# Patient Record
Sex: Female | Born: 1990 | Race: Black or African American | Hispanic: No | Marital: Single | Smoking: Former smoker
Health system: Southern US, Community
[De-identification: ages and names within clinical notes are randomized; demographics above are authoritative.]

## PROBLEM LIST (undated history)

## (undated) ENCOUNTER — Inpatient Hospital Stay (HOSPITAL_COMMUNITY): Payer: Self-pay

## (undated) DIAGNOSIS — F329 Major depressive disorder, single episode, unspecified: Secondary | ICD-10-CM

## (undated) DIAGNOSIS — F32A Depression, unspecified: Secondary | ICD-10-CM

## (undated) DIAGNOSIS — O4590 Premature separation of placenta, unspecified, unspecified trimester: Secondary | ICD-10-CM

## (undated) DIAGNOSIS — D573 Sickle-cell trait: Secondary | ICD-10-CM

---
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
## 2012-01-19 ENCOUNTER — Encounter (HOSPITAL_COMMUNITY): Payer: Self-pay | Admitting: *Deleted

## 2012-01-19 ENCOUNTER — Inpatient Hospital Stay (HOSPITAL_COMMUNITY)
Admission: AD | Admit: 2012-01-19 | Discharge: 2012-01-19 | Disposition: A | Discharge status: Home / Self Care | Payer: Medicaid Other | Source: Ambulatory Visit | Attending: Obstetrics and Gynecology | Admitting: Obstetrics and Gynecology

## 2012-01-19 DIAGNOSIS — R109 Unspecified abdominal pain: Secondary | ICD-10-CM | POA: Insufficient documentation

## 2012-01-19 DIAGNOSIS — O99891 Other specified diseases and conditions complicating pregnancy: Secondary | ICD-10-CM | POA: Insufficient documentation

## 2012-01-19 DIAGNOSIS — O26899 Other specified pregnancy related conditions, unspecified trimester: Secondary | ICD-10-CM

## 2012-01-19 HISTORY — DX: Sickle-cell trait: D57.3

## 2012-01-19 LAB — CBC
HCT: 30.5 % — ABNORMAL LOW (ref 36.0–46.0)
Hemoglobin: 10.4 g/dL — ABNORMAL LOW (ref 12.0–15.0)
MCH: 26.1 pg (ref 26.0–34.0)
MCV: 76.4 fL — ABNORMAL LOW (ref 78.0–100.0)
Platelets: 271 10*3/uL (ref 150–400)
RBC: 3.99 MIL/uL (ref 3.87–5.11)

## 2012-01-19 LAB — URINALYSIS, ROUTINE W REFLEX MICROSCOPIC
Bilirubin Urine: NEGATIVE
Glucose, UA: NEGATIVE mg/dL
Hgb urine dipstick: NEGATIVE
Specific Gravity, Urine: 1.02 (ref 1.005–1.030)
Urobilinogen, UA: 0.2 mg/dL (ref 0.0–1.0)

## 2012-01-19 LAB — COMPREHENSIVE METABOLIC PANEL
ALT: 5 U/L (ref 0–35)
AST: 11 U/L (ref 0–37)
Albumin: 2.8 g/dL — ABNORMAL LOW (ref 3.5–5.2)
Alkaline Phosphatase: 71 U/L (ref 39–117)
Potassium: 3.8 mEq/L (ref 3.5–5.1)
Sodium: 134 mEq/L — ABNORMAL LOW (ref 135–145)
Total Protein: 6.5 g/dL (ref 6.0–8.3)

## 2012-01-19 LAB — URINE MICROSCOPIC-ADD ON

## 2012-01-19 MED ORDER — ACETAMINOPHEN 325 MG PO TABS
650.0000 mg | ORAL_TABLET | Freq: Once | ORAL | Status: AC
  Administered 2012-01-19: 650 mg via ORAL
  Filled 2012-01-19: qty 2

## 2012-01-19 MED ORDER — GI COCKTAIL ~~LOC~~
30.0000 mL | Freq: Once | ORAL | Status: AC
  Administered 2012-01-19: 30 mL via ORAL
  Filled 2012-01-19: qty 30

## 2012-01-19 NOTE — MAU Note (Signed)
Dizzy and having sharp pains (Rt upper quad), this started last night.  Hurts when she breaths. Denies urinary symptoms.  nausea

## 2012-01-19 NOTE — MAU Provider Note (Signed)
History     CSN: [id number]  Arrival date & time 01/19/12  1234   None     Chief Complaint  Patient presents with  . Abdominal Pain  . Dizziness    (Consider location/radiation/quality/duration/timing/severity/associated sxs/prior treatment) HPI Pt is [redacted] weeks pregnant and presents with sharp shooting pain constant.  She is more comfortable on her side.  The pain is worse when she stands up and walk.  Earlier it hurt when she would take a deep breat- but not now.  She has a cough and cold for about one week.- non productive. She has been nauseated, but not vomiting. She did not take any medication for the pain because she says she did not know what to take.  The pain is better now that she has been laying on her side. Past Medical History  Diagnosis Date  . Depression   . Sickle cell trait     Past Surgical History  Procedure Date  . No past surgeries     History reviewed. No pertinent family history.  History  Substance Use Topics  . Smoking status: Never Smoker   . Smokeless tobacco: Never Used  . Alcohol Use: No    OB History    Grav Para Term Preterm Abortions TAB SAB Ect Mult Living   2 1 1       1       Review of Systems  HENT: Positive for congestion.   Respiratory: Positive for cough.   Gastrointestinal: Negative for abdominal distention.  Neurological: Positive for dizziness, light-headedness and headaches.    Allergies  Review of patient's allergies indicates no known allergies.  Home Medications  No current outpatient prescriptions on file.  BP 105/55  Pulse 95  Temp(Src) 97 F (36.1 C) (Oral)  Resp 20  Ht 5\' 5"  (1.651 m)  Wt 159 lb (72.122 kg)  BMI 26.46 kg/m2  SpO2 100%  LMP 07/14/2011  Physical Exam  Nursing note and vitals reviewed. Constitutional: She is oriented to person, place, and time. She appears well-developed and well-nourished.  Eyes: Pupils are equal, round, and reactive to light.  Neck: Normal range of motion. Neck  supple.  Pulmonary/Chest: Effort normal.  Abdominal: Soft. She exhibits no distension. There is no tenderness. There is no rebound and no guarding.       Minimal RUQ pain with palpation- no rebound.  Fetal heart rate documented on monitor - no contractions palpated or monitored  Musculoskeletal: Normal range of motion.  Neurological: She is alert and oriented to person, place, and time.  Skin: Skin is warm and dry.  Psychiatric: She has a normal mood and affect.    ED Course  Procedures (including critical care time) Results for orders placed during the hospital encounter of 01/19/12 (from the past 24 hour(s))  CBC     Status: Abnormal   Collection Time   01/19/12  1:17 PM      Component Value Range   WBC 10.2  4.0 - 10.5 (K/uL)   RBC 3.99  3.87 - 5.11 (MIL/uL)   Hemoglobin 10.4 (*) 12.0 - 15.0 (g/dL)   HCT 91.4 (*) 78.2 - 46.0 (%)   MCV 76.4 (*) 78.0 - 100.0 (fL)   MCH 26.1  26.0 - 34.0 (pg)   MCHC 34.1  30.0 - 36.0 (g/dL)   RDW 95.6  21.3 - 08.6 (%)   Platelets 271  150 - 400 (K/uL)    Labs Reviewed  URINALYSIS, ROUTINE W REFLEX MICROSCOPIC  CBC  COMPREHENSIVE METABOLIC PANEL   Results for orders placed during the hospital encounter of 01/19/12 (from the past 24 hour(s))  URINALYSIS, ROUTINE W REFLEX MICROSCOPIC     Status: Abnormal   Collection Time   01/19/12 12:55 PM      Component Value Range   Color, Urine YELLOW  YELLOW    APPearance CLEAR  CLEAR    Specific Gravity, Urine 1.020  1.005 - 1.030    pH 6.0  5.0 - 8.0    Glucose, UA NEGATIVE  NEGATIVE (mg/dL)   Hgb urine dipstick NEGATIVE  NEGATIVE    Bilirubin Urine NEGATIVE  NEGATIVE    Ketones, ur NEGATIVE  NEGATIVE (mg/dL)   Protein, ur NEGATIVE  NEGATIVE (mg/dL)   Urobilinogen, UA 0.2  0.0 - 1.0 (mg/dL)   Nitrite NEGATIVE  NEGATIVE    Leukocytes, UA SMALL (*) NEGATIVE   URINE MICROSCOPIC-ADD ON     Status: Abnormal   Collection Time   01/19/12 12:55 PM      Component Value Range   Squamous Epithelial / LPF  MANY (*) RARE    WBC, UA 3-6  <3 (WBC/hpf)   Bacteria, UA FEW (*) RARE    Urine-Other TRICHOMONAS PRESENT    CBC     Status: Abnormal   Collection Time   01/19/12  1:17 PM      Component Value Range   WBC 10.2  4.0 - 10.5 (K/uL)   RBC 3.99  3.87 - 5.11 (MIL/uL)   Hemoglobin 10.4 (*) 12.0 - 15.0 (g/dL)   HCT 27.2 (*) 53.6 - 46.0 (%)   MCV 76.4 (*) 78.0 - 100.0 (fL)   MCH 26.1  26.0 - 34.0 (pg)   MCHC 34.1  30.0 - 36.0 (g/dL)   RDW 64.4  03.4 - 74.2 (%)   Platelets 271  150 - 400 (K/uL)  COMPREHENSIVE METABOLIC PANEL     Status: Abnormal (Preliminary result)   Collection Time   01/19/12  1:17 PM      Component Value Range   Sodium 134 (*) 135 - 145 (mEq/L)   Potassium 3.8  3.5 - 5.1 (mEq/L)   Chloride 102  96 - 112 (mEq/L)   CO2 22  19 - 32 (mEq/L)   Glucose, Bld 91  70 - 99 (mg/dL)   BUN 11  6 - 23 (mg/dL)   Creatinine, Ser 5.95  0.50 - 1.10 (mg/dL)   Calcium 8.8  8.4 - 63.8 (mg/dL)   Total Protein 6.5  6.0 - 8.3 (g/dL)   Albumin 2.8 (*) 3.5 - 5.2 (g/dL)   AST 11  0 - 37 (U/L)   ALT 5  0 - 35 (U/L)   Alkaline Phosphatase PENDING  39 - 117 (U/L)   Total Bilirubin PENDING  0.3 - 1.2 (mg/dL)   GFR calc non Af Amer >90  >90 (mL/min)   GFR calc Af Amer >90  >90 (mL/min)   Results for orders placed during the hospital encounter of 01/19/12 (from the past 24 hour(s))  URINALYSIS, ROUTINE W REFLEX MICROSCOPIC     Status: Abnormal   Collection Time   01/19/12 12:55 PM      Component Value Range   Color, Urine YELLOW  YELLOW    APPearance CLEAR  CLEAR    Specific Gravity, Urine 1.020  1.005 - 1.030    pH 6.0  5.0 - 8.0    Glucose, UA NEGATIVE  NEGATIVE (mg/dL)   Hgb urine dipstick NEGATIVE  NEGATIVE  Bilirubin Urine NEGATIVE  NEGATIVE    Ketones, ur NEGATIVE  NEGATIVE (mg/dL)   Protein, ur NEGATIVE  NEGATIVE (mg/dL)   Urobilinogen, UA 0.2  0.0 - 1.0 (mg/dL)   Nitrite NEGATIVE  NEGATIVE    Leukocytes, UA SMALL (*) NEGATIVE   URINE MICROSCOPIC-ADD ON     Status: Abnormal    Collection Time   01/19/12 12:55 PM      Component Value Range   Squamous Epithelial / LPF MANY (*) RARE    WBC, UA 3-6  <3 (WBC/hpf)   Bacteria, UA FEW (*) RARE    Urine-Other TRICHOMONAS PRESENT    CBC     Status: Abnormal   Collection Time   01/19/12  1:17 PM      Component Value Range   WBC 10.2  4.0 - 10.5 (K/uL)   RBC 3.99  3.87 - 5.11 (MIL/uL)   Hemoglobin 10.4 (*) 12.0 - 15.0 (g/dL)   HCT 40.9 (*) 81.1 - 46.0 (%)   MCV 76.4 (*) 78.0 - 100.0 (fL)   MCH 26.1  26.0 - 34.0 (pg)   MCHC 34.1  30.0 - 36.0 (g/dL)   RDW 91.4  78.2 - 95.6 (%)   Platelets 271  150 - 400 (K/uL)  COMPREHENSIVE METABOLIC PANEL     Status: Abnormal   Collection Time   01/19/12  1:17 PM      Component Value Range   Sodium 134 (*) 135 - 145 (mEq/L)   Potassium 3.8  3.5 - 5.1 (mEq/L)   Chloride 102  96 - 112 (mEq/L)   CO2 22  19 - 32 (mEq/L)   Glucose, Bld 91  70 - 99 (mg/dL)   BUN 11  6 - 23 (mg/dL)   Creatinine, Ser 2.13  0.50 - 1.10 (mg/dL)   Calcium 8.8  8.4 - 08.6 (mg/dL)   Total Protein 6.5  6.0 - 8.3 (g/dL)   Albumin 2.8 (*) 3.5 - 5.2 (g/dL)   AST 11  0 - 37 (U/L)   ALT 5  0 - 35 (U/L)   Alkaline Phosphatase 71  39 - 117 (U/L)   Total Bilirubin 0.2 (*) 0.3 - 1.2 (mg/dL)   GFR calc non Af Amer >90  >90 (mL/min)   GFR calc Af Amer >90  >90 (mL/min)  LIPASE, BLOOD     Status: Normal   Collection Time   01/19/12  1:17 PM      Component Value Range   Lipase 26  11 - 59 (U/L)  AMYLASE     Status: Normal   Collection Time   01/19/12  1:17 PM      Component Value Range   Amylase 73  0 - 105 (U/L)   No results found. Lipase and Amylase pending Discussed with Dr. Senaida Ores-( pt has not had an OB visit in 2 months)  Abdominal pain in pregnancy GI cocktail and Tylenol given in MAU Pt to f/u for continued OB care with Dr. Senaida Ores   MDM

## 2012-02-02 ENCOUNTER — Encounter (HOSPITAL_COMMUNITY): Payer: Self-pay | Admitting: Emergency Medicine

## 2012-02-02 ENCOUNTER — Emergency Department (INDEPENDENT_AMBULATORY_CARE_PROVIDER_SITE_OTHER)
Admission: EM | Admit: 2012-02-02 | Discharge: 2012-02-02 | Disposition: A | Discharge status: Nursing Home (Includes ICF) | Payer: Medicaid Other | Source: Home / Self Care | Attending: Emergency Medicine | Admitting: Emergency Medicine

## 2012-02-02 ENCOUNTER — Encounter (HOSPITAL_COMMUNITY): Payer: Self-pay | Admitting: *Deleted

## 2012-02-02 ENCOUNTER — Inpatient Hospital Stay (HOSPITAL_COMMUNITY)
Admission: AD | Admit: 2012-02-02 | Discharge: 2012-02-02 | Disposition: A | Discharge status: Home / Self Care | Payer: Medicaid Other | Source: Ambulatory Visit | Attending: Obstetrics and Gynecology | Admitting: Obstetrics and Gynecology

## 2012-02-02 DIAGNOSIS — A599 Trichomoniasis, unspecified: Secondary | ICD-10-CM

## 2012-02-02 DIAGNOSIS — A5901 Trichomonal vulvovaginitis: Secondary | ICD-10-CM

## 2012-02-02 DIAGNOSIS — O4700 False labor before 37 completed weeks of gestation, unspecified trimester: Secondary | ICD-10-CM

## 2012-02-02 DIAGNOSIS — D649 Anemia, unspecified: Secondary | ICD-10-CM

## 2012-02-02 DIAGNOSIS — O98819 Other maternal infectious and parasitic diseases complicating pregnancy, unspecified trimester: Secondary | ICD-10-CM | POA: Insufficient documentation

## 2012-02-02 DIAGNOSIS — R109 Unspecified abdominal pain: Secondary | ICD-10-CM

## 2012-02-02 DIAGNOSIS — O26899 Other specified pregnancy related conditions, unspecified trimester: Secondary | ICD-10-CM

## 2012-02-02 DIAGNOSIS — E860 Dehydration: Secondary | ICD-10-CM

## 2012-02-02 LAB — CBC
MCV: 75.1 fL — ABNORMAL LOW (ref 78.0–100.0)
Platelets: 223 10*3/uL (ref 150–400)
RBC: 3.7 MIL/uL — ABNORMAL LOW (ref 3.87–5.11)
RDW: 13.9 % (ref 11.5–15.5)
WBC: 10.8 10*3/uL — ABNORMAL HIGH (ref 4.0–10.5)

## 2012-02-02 LAB — DIFFERENTIAL
Basophils Absolute: 0 10*3/uL (ref 0.0–0.1)
Eosinophils Relative: 1 % (ref 0–5)
Lymphocytes Relative: 20 % (ref 12–46)
Lymphs Abs: 2.2 10*3/uL (ref 0.7–4.0)
Neutro Abs: 7.6 10*3/uL (ref 1.7–7.7)
Neutrophils Relative %: 70 % (ref 43–77)

## 2012-02-02 LAB — POCT URINALYSIS DIP (DEVICE)
Glucose, UA: NEGATIVE mg/dL
Ketones, ur: NEGATIVE mg/dL
Nitrite: NEGATIVE
Specific Gravity, Urine: >=1.03 (ref 1.005–1.030)
pH: 6.5 (ref 5.0–8.0)

## 2012-02-02 LAB — WET PREP, GENITAL
Clue Cells Wet Prep HPF POC: NONE SEEN
Yeast Wet Prep HPF POC: NONE SEEN

## 2012-02-02 LAB — POCT I-STAT, CHEM 8
BUN: 8 mg/dL (ref 6–23)
Creatinine, Ser: 0.7 mg/dL (ref 0.50–1.10)
Potassium: 3.6 mEq/L (ref 3.5–5.1)
Sodium: 137 mEq/L (ref 135–145)
TCO2: 22 mmol/L (ref 0–100)

## 2012-02-02 MED ORDER — SODIUM CHLORIDE 0.9 % IV SOLN
INTRAVENOUS | Status: DC
  Administered 2012-02-02: 19:00:00 via INTRAVENOUS

## 2012-02-02 MED ORDER — METRONIDAZOLE 500 MG PO TABS: 500.0000 mg | ORAL_TABLET | Freq: Two times a day (BID) | ORAL | Status: AC

## 2012-02-02 MED ORDER — TERBUTALINE SULFATE 1 MG/ML IJ SOLN
0.2500 mg | Freq: Once | INTRAMUSCULAR | Status: AC
  Administered 2012-02-02: 0.25 mg via SUBCUTANEOUS

## 2012-02-02 MED ORDER — LACTATED RINGERS IV SOLN
INTRAVENOUS | Status: DC
  Administered 2012-02-02: 22:00:00 via INTRAVENOUS

## 2012-02-02 NOTE — Discharge Instructions (Signed)
We have determined that your problem requires further evaluation in the emergency department.  We will take care of your transport there.  Once at the emergency department, you will be evaluated by a provider and they will order whatever treatment or tests they deem necessary.  We cannot guarantee that they will do any specific test or do any specific treatment.    Dehydration, Adult Dehydration means your body does not have as much fluid as it needs. Your kidneys, brain, and heart will not work properly without the right amount of fluids and salt.  HOME CARE  Ask your doctor how to replace body fluid losses (rehydrate).   Drink enough fluids to keep your pee (urine) clear or pale yellow.   Drink small amounts of fluids often if you feel sick to your stomach (nauseous) or throw up (vomit).   Eat like you normally do.   Avoid:   Foods or drinks high in sugar.   Bubbly (carbonated) drinks.   Juice.   Very hot or cold fluids.   Drinks with caffeine.   Fatty, greasy foods.   Alcohol.   Tobacco.   Eating too much.   Gelatin desserts.   Wash your hands to avoid spreading germs (bacteria, viruses).   Only take medicine as told by your doctor.   Keep all doctor visits as told.  GET HELP RIGHT AWAY IF:   You cannot drink something without throwing up.   You get worse even with treatment.   Your vomit has blood in it or looks greenish.   Your poop (stool) has blood in it or looks black and tarry.   You have not peed in 6 to 8 hours.   You pee a small amount of very dark pee.   You have a fever.   You pass out (faint).   You have belly (abdominal) pain that gets worse or stays in one spot (localizes).   You have a rash, stiff neck, or bad headache.   You get easily annoyed, sleepy, or are hard to wake up.   You feel weak, dizzy, or very thirsty.  MAKE SURE YOU:   Understand these instructions.   Will watch your condition.   Will get help right away if you  are not doing well or get worse.  Document Released: 06/27/2009 Document Revised: 08/20/2011 Document Reviewed: 04/20/2011 [facility] Patient Information 2012 [place], [place].Iron Deficiency Anemia There are many types of anemia. Iron deficiency anemia is the most common. Iron deficiency anemia is a decrease in the number of red blood cells caused by too little iron. Without enough iron, your body does not produce enough hemoglobin. Hemoglobin is a substance in red blood cells that carries oxygen to the body's tissues. Iron deficiency anemia may leave you tired and short of breath. CAUSES   Lack of iron in the diet.   This may be seen in infants and children, because there is little iron in milk.   This may be seen in adults who do not eat enough iron-rich foods.   This may be seen in pregnant or breastfeeding women who do not take iron supplements. There is a much higher need for iron intake at these times.   Poor absorption of iron, as seen with intestinal disorders.   Intestinal bleeding.   Heavy periods.  SYMPTOMS  Mild anemia may not be noticeable. Symptoms may include:  Fatigue.   Headache.   Pale skin.   Weakness.   Shortness of breath.  Dizziness.   Cold hands and feet.   Fast or irregular heartbeat.  DIAGNOSIS  Diagnosis requires a thorough evaluation and physical exam by your caregiver.  Blood tests are generally used to confirm iron deficiency anemia.   Additional tests may be done to find the underlying cause of your anemia. These may include:   Testing for blood in the stool (fecal occult blood test).   A procedure to see inside the colon and rectum (colonoscopy).   A procedure to see inside the esophagus and stomach (endoscopy).  TREATMENT   Correcting the cause of the iron deficiency is the first step.   Medicines, such as oral contraceptives, can make heavy menstrual flows lighter.   Antibiotics and other medicines can be used to treat peptic  ulcers.   Surgery may be needed to remove a bleeding polyp, tumor, or fibroid.   Often, iron supplements (ferrous sulfate) are taken.   For the best iron absorption, take these supplements with an empty stomach.   You may need to take the supplements with food if you cannot tolerate them on an empty stomach. Vitamin C improves the absorption of iron. Your caregiver may recommend taking your iron tablets with a glass of orange juice or vitamin C supplement.   Milk and antacids should not be taken at the same time as iron supplements. They may interfere with the absorption of iron.   Iron supplements can cause constipation. A stool softener is often recommended.   Pregnant and breastfeeding women will need to take extra iron, because their normal diet usually will not provide the required amount.   Patients who cannot tolerate iron by mouth can take it through a vein (intravenously) or by an injection into the muscle.  HOME CARE INSTRUCTIONS   Ask your dietitian for help with diet questions.   Take iron and vitamins as directed by your caregiver.   Eat a diet rich in iron. Eat liver, lean beef, whole-grain bread, eggs, dried fruit, and dark green leafy vegetables.  SEEK IMMEDIATE MEDICAL CARE IF:   You have a fainting episode. Do not drive yourself. Call your local emergency services (911 in [place] if no other help is available.   You have chest pain, nausea, or vomiting.   You develop severe or increased shortness of breath with activities.   You develop weakness or increased thirst.   You have a rapid heartbeat.   You develop unexplained sweating or become lightheaded when getting up from a chair or bed.  MAKE SURE YOU:   Understand these instructions.   Will watch your condition.   Will get help right away if you are not doing well or get worse.  Document Released: 08/28/2000 Document Revised: 08/20/2011 Document Reviewed: 01/07/2010 [facility] Patient Information 2012  [place], [place].Trichomoniasis Trichomoniasis is an infection, caused by the Trichomonas organism, that affects both women and men. In women, the outer female genitalia and the vagina are affected. In men, the penis is mainly affected, but the prostate and other reproductive organs can also be involved. Trichomoniasis is a sexually transmitted disease (STD) and is most often passed to another person through sexual contact. The majority of people who get trichomoniasis, do so from a sexual encounter and are also at risk for other STDs, including gonorrhea and chlamydia, hepatitis B, and HIV. Trichomoniasis can increase the risk for HIV and pregnancy problems. SYMPTOMS   Abnormal gray-green, frothy vaginal discharge in women.   Itching and irritation of the area outside the vagina  in women.   Penile discharge with or without pain in males.   Inflammation of the urethra (urethritis), causing painful urination.   Bleeding after sexual intercourse.  HOME CARE INSTRUCTIONS   Take all medication prescribed by your caregiver.   Take over-the-counter medication for itching or irritation as directed by your caregiver.   Do not have sexual intercourse while you have the infection.   Do not douche or wear tampons while you have the infection.   Discuss the infection with your partner, as your partner may have acquired the infection from you. Or, your partner may have been the person who transmitted the infection to you.   Have your sex partner examined and treated if necessary.   Practice safe, informed, and protected sex.   See your caregiver for other STD testing.  SEEK MEDICAL CARE IF:  You still have symptoms after you finish the medication.   You have an oral temperature above 102 F (38.9 C).   You develop belly (abdominal) pain.   You have pain when you urinate.   You have bleeding after sexual intercourse.   You develop a rash.   The medication makes you sick or throw up  (vomit).  Document Released: 10/08/2004 Document Revised: 08/20/2011 Document Reviewed: 03/22/2009 [facility] Patient Information 2012 [place], [place].

## 2012-02-02 NOTE — ED Notes (Addendum)
Vaginal pain and discharge, onset Wednesday.  This is patient's second pregnancy.  C/o dizziness, feeling as if she might pass out.

## 2012-02-02 NOTE — MAU Note (Signed)
Pt states she went to Urgent Care because she was feeling dizzy and was having pain in her abdomen. Pt states she no longer has any pain

## 2012-02-02 NOTE — MAU Provider Note (Signed)
History     CSN: [id number]  Arrival date and time: 02/02/12 1927   None     Chief Complaint  Patient presents with  . Abdominal Pain   HPI Linda Barry is 21 y.o. G2P1001 [redacted]w[redacted]d weeks presenting with abdominal pain.   She was transferred from [facility] by [facility].   She has Trichomonas by urinalysis on last visit 2 weeks ago when I saw her in MAU.  At that time she was complaining of upper abdominal pain.  She left before urinalysis results were back. On review- she had Trichomonas.   She was seen here two weeks ago for dizziness.  This abdominal pain last week but has become worse.  She has had vaginal pain worse today.  She was last seen in the office last Thursday but was not having bad pain at that time.  Her labs today at Urgent Care included wet prep which showed many trich .  GC and Chlamydia are pending.  Urine culture pending. Past Medical History  Diagnosis Date  . Depression   . Sickle cell trait     Past Surgical History  Procedure Date  . No past surgeries     No family history on file.  History  Substance Use Topics  . Smoking status: Never Smoker   . Smokeless tobacco: Never Used  . Alcohol Use: No    Allergies: No Known Allergies  Prescriptions prior to admission  Medication Sig Dispense Refill  . Prenatal Vit-Fe Fumarate-FA (PRENATAL MULTIVITAMIN) TABS Take 1 tablet by mouth daily.        ROS Physical Exam   Blood pressure 102/60, pulse 77, temperature 98.1 F (36.7 C), temperature source Oral, resp. rate 18, last menstrual period 07/14/2011.  Physical Exam  Vitals reviewed. Constitutional: She is oriented to person, place, and time. She appears well-developed and well-nourished.  HENT:  Head: Normocephalic.  Eyes: Pupils are equal, round, and reactive to light.  Neck: Normal range of motion. Neck supple.  Cardiovascular: Normal rate.   Respiratory: Effort normal.  GI: Soft.  Genitourinary:       Mod amount of frothy yellow  discharge in vault; vaginal mucosa reddened; cervix NT , FT; uterus gravid nontender;  ctx noted on monitor  Musculoskeletal: Normal range of motion.  Neurological: She is alert and oriented to person, place, and time.  Skin: Skin is warm and dry.    MAU Course  Procedures  Results for orders placed during the hospital encounter of 02/02/12 (from the past 24 hour(s))  POCT URINALYSIS DIP (DEVICE)     Status: Abnormal   Collection Time   02/02/12  5:11 PM      Component Value Range   Glucose, UA NEGATIVE  NEGATIVE (mg/dL)   Bilirubin Urine SMALL (*) NEGATIVE    Ketones, ur NEGATIVE  NEGATIVE (mg/dL)   Specific Gravity, Urine >=1.030  1.005 - 1.030    Hgb urine dipstick TRACE (*) NEGATIVE    pH 6.5  5.0 - 8.0    Protein, ur 100 (*) NEGATIVE (mg/dL)   Urobilinogen, UA 1.0  0.0 - 1.0 (mg/dL)   Nitrite NEGATIVE  NEGATIVE    Leukocytes, UA SMALL (*) NEGATIVE   POCT PREGNANCY, URINE     Status: Abnormal   Collection Time   02/02/12  5:16 PM      Component Value Range   Preg Test, Ur POSITIVE (*) NEGATIVE   WET PREP, GENITAL     Status: Abnormal  Collection Time   02/02/12  5:42 PM      Component Value Range   Yeast Wet Prep HPF POC NONE SEEN  NONE SEEN    Trich, Wet Prep MANY (*) NONE SEEN    Clue Cells Wet Prep HPF POC NONE SEEN  NONE SEEN    WBC, Wet Prep HPF POC MANY (*) NONE SEEN   CBC     Status: Abnormal   Collection Time   02/02/12  5:48 PM      Component Value Range   WBC 10.8 (*) 4.0 - 10.5 (K/uL)   RBC 3.70 (*) 3.87 - 5.11 (MIL/uL)   Hemoglobin 9.8 (*) 12.0 - 15.0 (g/dL)   HCT 16.1 (*) 09.6 - 46.0 (%)   MCV 75.1 (*) 78.0 - 100.0 (fL)   MCH 26.5  26.0 - 34.0 (pg)   MCHC 35.3  30.0 - 36.0 (g/dL)   RDW 04.5  40.9 - 81.1 (%)   Platelets 223  150 - 400 (K/uL)  DIFFERENTIAL     Status: Normal   Collection Time   02/02/12  5:48 PM      Component Value Range   Neutrophils Relative 70  43 - 77 (%)   Neutro Abs 7.6  1.7 - 7.7 (K/uL)   Lymphocytes Relative 20  12 - 46 (%)     Lymphs Abs 2.2  0.7 - 4.0 (K/uL)   Monocytes Relative 8  3 - 12 (%)   Monocytes Absolute 0.9  0.1 - 1.0 (K/uL)   Eosinophils Relative 1  0 - 5 (%)   Eosinophils Absolute 0.2  0.0 - 0.7 (K/uL)   Basophils Relative 0  0 - 1 (%)   Basophils Absolute 0.0  0.0 - 0.1 (K/uL)  POCT I-STAT, CHEM 8     Status: Abnormal   Collection Time   02/02/12  5:53 PM      Component Value Range   Sodium 137  135 - 145 (mEq/L)   Potassium 3.6  3.5 - 5.1 (mEq/L)   Chloride 106  96 - 112 (mEq/L)   BUN 8  6 - 23 (mg/dL)   Creatinine, Ser 9.14  0.50 - 1.10 (mg/dL)   Glucose, Bld 89  70 - 99 (mg/dL)   Calcium, Ion 7.82  9.56 - 1.32 (mmol/L)   TCO2 22  0 - 100 (mmol/L)   Hemoglobin 9.5 (*) 12.0 - 15.0 (g/dL)   HCT 21.3 (*) 08.6 - 46.0 (%)   Care turned over to S. Chase Picket, NP Pt started having contractions every 2 to 3 minutes- Dr. Jackelyn Knife informed- will increase IV fluids and give Terbutaline  Care turned over to [facility], NP Assessment and Plan    KEY,EVE M 02/02/2012, 7:44 PM   After Terbutaline contractions stopped. Patient feeling better. RN called Dr. Jackelyn Knife to give update and he request that we d/c patient home to follow up in the office.   Assessment: Abdominal pain in pregnancy    Threatened preterm labor   Trichomonas vaginosis  Plan:  Rx flagyl   Instructions to patient regarding preterm labor.    Follow up in the office.    Return here as needed.

## 2012-02-02 NOTE — Discharge Instructions (Signed)
Call the office for follow up. Return here as needed.  Abdominal Pain During Pregnancy Abdominal discomfort is common in pregnancy. Most of the time, it does not cause harm. There are many causes of abdominal pain. Some causes are more serious than others. Some of the causes of abdominal pain in pregnancy are easily diagnosed. Occasionally, the diagnosis takes time to understand. Other times, the cause is not determined. Abdominal pain can be a sign that something is very wrong with the pregnancy, or the pain may have nothing to do with the pregnancy at all. For this reason, always tell your caregiver if you have any abdominal discomfort. CAUSES Common and harmless causes of abdominal pain include:  Constipation.   Excess gas and bloating.   Round ligament pain. This is pain that is felt in the folds of the groin.   The position the baby or placenta is in.   Baby kicks.   Braxton-Hicks contractions. These are mild contractions that do not cause cervical dilation.  Serious causes of abdominal pain include:  Ectopic pregnancy. This happens when a fertilized egg implants outside of the uterus.   Miscarriage.   Preterm labor. This is when labor starts at less than 37 weeks of pregnancy.   Placental abruption. This is when the placenta partially or completely separates from the uterus.   Preeclampsia. This is often associated with high blood pressure and has been referred to as "toxemia in pregnancy."   Uterine or amniotic fluid infections.  Causes unrelated to pregnancy include:  Urinary tract infection.   Gallbladder stones or inflammation.   Hepatitis or other liver illness.   Intestinal problems, stomach flu, food poisoning, or ulcer.   Appendicitis.   Kidney (renal) stones.   Kidney infection (pylonephritis).  HOME CARE INSTRUCTIONS  For mild pain:  Do not have sexual intercourse or put anything in your vagina until your symptoms go away completely.   Get plenty of  rest until your pain improves. If your pain does not improve in 1 hour, call your caregiver.   Drink clear fluids if you feel nauseous. Avoid solid food as long as you are uncomfortable or nauseous.   Only take medicine as directed by your caregiver.   Keep all follow-up appointments with your caregiver.  SEEK IMMEDIATE MEDICAL CARE IF:  You are bleeding, leaking fluid, or passing tissue from the vagina.   You have increasing pain or cramping.   You have persistent vomiting.   You have painful or bloody urination.   You have a fever.   You notice a decrease in your baby's movements.   You have extreme weakness or feel faint.   You have shortness of breath, with or without abdominal pain.   You develop a severe headache with abdominal pain.   You have abnormal vaginal discharge with abdominal pain.   You have persistent diarrhea.   You have abdominal pain that continues even after rest, or gets worse.  MAKE SURE YOU:   Understand these instructions.   Will watch your condition.   Will get help right away if you are not doing well or get worse.  Document Released: 08/31/2005 Document Revised: 08/20/2011 Document Reviewed: 03/27/2011 [facility] Patient Information 2012 [place], [place].Abdominal Pain During Pregnancy Abdominal discomfort is common in pregnancy. Most of the time, it does not cause harm. There are many causes of abdominal pain. Some causes are more serious than others. Some of the causes of abdominal pain in pregnancy are easily diagnosed. Occasionally, the diagnosis  takes time to understand. Other times, the cause is not determined. Abdominal pain can be a sign that something is very wrong with the pregnancy, or the pain may have nothing to do with the pregnancy at all. For this reason, always tell your caregiver if you have any abdominal discomfort. CAUSES Common and harmless causes of abdominal pain include:  Constipation.   Excess gas and bloating.    Round ligament pain. This is pain that is felt in the folds of the groin.   The position the baby or placenta is in.   Baby kicks.   Braxton-Hicks contractions. These are mild contractions that do not cause cervical dilation.  Serious causes of abdominal pain include:  Ectopic pregnancy. This happens when a fertilized egg implants outside of the uterus.   Miscarriage.   Preterm labor. This is when labor starts at less than 37 weeks of pregnancy.   Placental abruption. This is when the placenta partially or completely separates from the uterus.   Preeclampsia. This is often associated with high blood pressure and has been referred to as "toxemia in pregnancy."   Uterine or amniotic fluid infections.  Causes unrelated to pregnancy include:  Urinary tract infection.   Gallbladder stones or inflammation.   Hepatitis or other liver illness.   Intestinal problems, stomach flu, food poisoning, or ulcer.   Appendicitis.   Kidney (renal) stones.   Kidney infection (pylonephritis).  HOME CARE INSTRUCTIONS  For mild pain:  Do not have sexual intercourse or put anything in your vagina until your symptoms go away completely.   Get plenty of rest until your pain improves. If your pain does not improve in 1 hour, call your caregiver.   Drink clear fluids if you feel nauseous. Avoid solid food as long as you are uncomfortable or nauseous.   Only take medicine as directed by your caregiver.   Keep all follow-up appointments with your caregiver.  SEEK IMMEDIATE MEDICAL CARE IF:  You are bleeding, leaking fluid, or passing tissue from the vagina.   You have increasing pain or cramping.   You have persistent vomiting.   You have painful or bloody urination.   You have a fever.   You notice a decrease in your baby's movements.   You have extreme weakness or feel faint.   You have shortness of breath, with or without abdominal pain.   You develop a severe headache with  abdominal pain.   You have abnormal vaginal discharge with abdominal pain.   You have persistent diarrhea.   You have abdominal pain that continues even after rest, or gets worse.  MAKE SURE YOU:   Understand these instructions.   Will watch your condition.   Will get help right away if you are not doing well or get worse.  Document Released: 08/31/2005 Document Revised: 08/20/2011 Document Reviewed: 03/27/2011 [facility] Patient Information 2012 [place], [place].

## 2012-02-02 NOTE — ED Provider Notes (Signed)
Chief Complaint  Patient presents with  . Vaginal Pain    History of Present Illness:   Linda Barry is an 21 year old female who is 7 months pregnant. She is a gravida 2, para 1, abortus 0. She had no complications with her previous pregnancy. Dr. Ellyn Hack is her obstetrician and there have been no complications with this pregnancy. She was at [facility] hospital emergency room 2 weeks ago because of dizziness and pain. Nothing abnormal was found she was sent home with Tylenol for the pain. Over the past week she's developed vaginal pain, itching, and discharge. She denies any odor. Over the past days she's had dizziness. She notes postural presyncope. She feels her she's about to pass out in the room spins. Her vision seems blurry. She denies any abdominal pain or vaginal bleeding. She's had no fever or chills. No coughing, wheezing, shortness of breath, or chest pain. No nausea, vomiting, or diarrhea. No urinary symptoms.  Review of Systems:  Other than noted above, the patient denies any of the following symptoms: Systemic:  No fever, chills, sweats, fatigue, or weight loss. GI:  No abdominal pain, nausea, anorexia, vomiting, diarrhea, constipation, melena or hematochezia. GU:  No dysuria, frequency, urgency, hematuria, vaginal discharge, itching, or abnormal vaginal bleeding. Skin:  No rash or itching.   PMFSH:  Past medical history, family history, social history, meds, and allergies were reviewed.  Physical Exam:   Vital signs:  BP 89/43  Pulse 109  Temp(Src) 98.5 F (36.9 C) (Oral)  Resp 18  SpO2 100%  LMP 07/14/2011 Filed Vitals:   02/02/12 1709 02/02/12 1814 supine 02/02/12 1815 sitting 05 /21/13 1816 standing  BP: 102/75 104/63 95/52 89/43   Pulse: 80 80 78 109  Temp: 98.5 F (36.9 C)     TempSrc: Oral     Resp: 18     SpO2: 100%      General:  Alert, oriented and in no distress. Lungs:  Breath sounds clear and equal bilaterally.  No wheezes, rales or rhonchi. Heart:  Regular  rhythm.  No gallops or murmers. Abdomen:  Soft, with a 7 month size uterus which was nontender.  No organomegaly or mass.  No tenderness, guarding or rebound.  Bowel sounds normally active. Pelvic exam:  There was some swelling and erythema of the labia minora but no ulcerations. On speculum exam she had a copious, frothy, white discharge which was non-malodorous. There was no pain on cervical motion. A GC and Chlamydia DNA probe was not done since she is close to term. Her uterus is normal for a 7 month pregnancy and is nontender. There were no other masses felt. Skin:  Clear, warm and dry.  Labs:   Results for orders placed during the hospital encounter of 02/02/12  POCT URINALYSIS DIP (DEVICE)      Component Value Range   Glucose, UA NEGATIVE  NEGATIVE (mg/dL)   Bilirubin Urine SMALL (*) NEGATIVE    Ketones, ur NEGATIVE  NEGATIVE (mg/dL)   Specific Gravity, Urine >=1.030  1.005 - 1.030    Hgb urine dipstick TRACE (*) NEGATIVE    pH 6.5  5.0 - 8.0    Protein, ur 100 (*) NEGATIVE (mg/dL)   Urobilinogen, UA 1.0  0.0 - 1.0 (mg/dL)   Nitrite NEGATIVE  NEGATIVE    Leukocytes, UA SMALL (*) NEGATIVE   POCT PREGNANCY, URINE      Component Value Range   Preg Test, Ur POSITIVE (*) NEGATIVE   WET PREP, GENITAL  Component Value Range   Yeast Wet Prep HPF POC NONE SEEN  NONE SEEN    Trich, Wet Prep MANY (*) NONE SEEN    Clue Cells Wet Prep HPF POC NONE SEEN  NONE SEEN    WBC, Wet Prep HPF POC MANY (*) NONE SEEN   CBC      Component Value Range   WBC 10.8 (*) 4.0 - 10.5 (K/uL)   RBC 3.70 (*) 3.87 - 5.11 (MIL/uL)   Hemoglobin 9.8 (*) 12.0 - 15.0 (g/dL)   HCT 82.9 (*) 56.2 - 46.0 (%)   MCV 75.1 (*) 78.0 - 100.0 (fL)   MCH 26.5  26.0 - 34.0 (pg)   MCHC 35.3  30.0 - 36.0 (g/dL)   RDW 13.0  86.5 - 78.4 (%)   Platelets 223  150 - 400 (K/uL)  DIFFERENTIAL      Component Value Range   Neutrophils Relative 70  43 - 77 (%)   Neutro Abs 7.6  1.7 - 7.7 (K/uL)   Lymphocytes Relative 20  12 - 46  (%)   Lymphs Abs 2.2  0.7 - 4.0 (K/uL)   Monocytes Relative 8  3 - 12 (%)   Monocytes Absolute 0.9  0.1 - 1.0 (K/uL)   Eosinophils Relative 1  0 - 5 (%)   Eosinophils Absolute 0.2  0.0 - 0.7 (K/uL)   Basophils Relative 0  0 - 1 (%)   Basophils Absolute 0.0  0.0 - 0.1 (K/uL)  POCT I-STAT, CHEM 8      Component Value Range   Sodium 137  135 - 145 (mEq/L)   Potassium 3.6  3.5 - 5.1 (mEq/L)   Chloride 106  96 - 112 (mEq/L)   BUN 8  6 - 23 (mg/dL)   Creatinine, Ser 6.96  0.50 - 1.10 (mg/dL)   Glucose, Bld 89  70 - 99 (mg/dL)   Calcium, Ion 2.95  2.84 - 1.32 (mmol/L)   TCO2 22  0 - 100 (mmol/L)   Hemoglobin 9.5 (*) 12.0 - 15.0 (g/dL)   HCT 13.2 (*) 44.0 - 46.0 (%)     Assessment:  The primary encounter diagnosis was Trichomonas vaginitis. Diagnoses of Dehydration and Anemia were also pertinent to this visit.  Plan:   1.  The following meds were prescribed:   New Prescriptions   No medications on file   2.  The patient will be transferred to [facility] hospital by [facility]. An IV was started with normal saline at 50 mL per hour. Report was called to the PA on duty at the emergency room there.  Reuben Likes, MD 02/02/12 [phone]

## 2012-02-04 ENCOUNTER — Telehealth (HOSPITAL_COMMUNITY): Payer: Self-pay | Admitting: *Deleted

## 2012-02-04 LAB — URINE CULTURE: Colony Count: >=100000

## 2012-02-04 NOTE — ED Notes (Signed)
Wet Prep: Many Trich, many WBC's, Urine culture: >100,000 colonies Group B strep (S. Agalactiae). Pt. transferred. Pt. adequately treated with Flagyl @ [facility].  Lab shown to Dr. Ladon Applebaum and said if pt. is having dysuria and not allergic, treat with Amoxicillin 500 mg. 1 tab po TID x 7 days. Tell pt. to notify her OB-GYN doctor she was pos. for Group B strep.  She will need tx. before her delivery. I checked chart and pt. has NKDA.  I called and message said "this person cannot accept calls at this time."  Call 1.

## 2012-02-05 ENCOUNTER — Telehealth (HOSPITAL_COMMUNITY): Payer: Self-pay | Admitting: *Deleted

## 2012-02-05 NOTE — ED Notes (Signed)
Pt.'s phone still not accepting calls. I called pt.'s contact and she said she left a message with her sister yesterday but has not heard back. I asked her if she would try again today and she said she would.  Call 2. Linda Barry 02/05/2012

## 2012-02-08 ENCOUNTER — Telehealth (HOSPITAL_COMMUNITY): Payer: Self-pay | Admitting: *Deleted

## 2012-02-08 NOTE — ED Notes (Signed)
Urine culture positive for GBS.  Pt is pregnant.  Unable to reach Linda Barry, phone not accepting calls.  Contacted her emergency contact, Linda Barry, who states she has been trying to contact Linda Barry as well and will instruct her to call [phone] if she is able to speak with her.

## 2012-02-09 NOTE — ED Notes (Signed)
Unable to contact pt via telephone x 3 attempts.  Letter mailed marked confidential to pt's address on file making patient aware of urine culture positive for GBS, instructing pt to notify her OB-Gyn and to call [facility] if she is having ongoing symptoms for a new Rx.

## 2012-03-16 ENCOUNTER — Inpatient Hospital Stay (HOSPITAL_COMMUNITY)
Admission: AD | Admit: 2012-03-16 | Discharge: 2012-03-16 | Disposition: A | Discharge status: Home / Self Care | Payer: Medicaid Other | Source: Ambulatory Visit | Attending: Obstetrics and Gynecology | Admitting: Obstetrics and Gynecology

## 2012-03-16 ENCOUNTER — Encounter (HOSPITAL_COMMUNITY): Payer: Self-pay

## 2012-03-16 DIAGNOSIS — B373 Candidiasis of vulva and vagina: Secondary | ICD-10-CM

## 2012-03-16 DIAGNOSIS — O2390 Unspecified genitourinary tract infection in pregnancy, unspecified trimester: Secondary | ICD-10-CM | POA: Insufficient documentation

## 2012-03-16 DIAGNOSIS — B3731 Acute candidiasis of vulva and vagina: Secondary | ICD-10-CM

## 2012-03-16 DIAGNOSIS — N949 Unspecified condition associated with female genital organs and menstrual cycle: Secondary | ICD-10-CM | POA: Insufficient documentation

## 2012-03-16 LAB — WET PREP, GENITAL: Clue Cells Wet Prep HPF POC: NONE SEEN

## 2012-03-16 MED ORDER — TERCONAZOLE 0.8 % VA CREA: 1.0000 | TOPICAL_CREAM | Freq: Every day | VAGINAL | Status: AC

## 2012-03-16 NOTE — MAU Provider Note (Signed)
  History     CSN: [id number]  Arrival date and time: 03/16/12 1319   None     No chief complaint on file.  HPI Linda Barry is 21 y.o. G2P1001 [redacted]w[redacted]d weeks presenting with vaginal irritation.  Denis abnormal discharge.  Had spotting last night and today.  Denies abdominal or vaginal pain.  Denies contractions.  + fetal movement.   Patient of Dr. Emeline Darling.  Has appt for next week but " I can't wait until then".  Last intercourse 3 weeks ago.   Past Medical History  Diagnosis Date  . Depression   . Sickle cell trait     History reviewed. No pertinent past surgical history.  History reviewed. No pertinent family history.  History  Substance Use Topics  . Smoking status: Former Smoker -- 0.2 packs/day    Quit date: 02/01/2012  . Smokeless tobacco: Never Used  . Alcohol Use: No    Allergies: No Known Allergies  No prescriptions prior to admission    Review of Systems  Constitutional: Negative for fever and chills.  Eyes: Negative.   Respiratory: Negative.   Cardiovascular: Negative.   Gastrointestinal: Negative for nausea, vomiting and abdominal pain.  Genitourinary:       + for vaginal irritation and spotting  Neurological: Negative for headaches.   Physical Exam   Blood pressure 99/59, pulse 78, temperature 98.3 F (36.8 C), temperature source Oral, resp. rate 16, height 5\' 6"  (1.676 m), weight 76.476 kg (168 lb 9.6 oz), last menstrual period 07/14/2011.  Physical Exam  Constitutional: She is oriented to person, place, and time. She appears well-developed and well-nourished. No distress.  HENT:  Head: Normocephalic.  Neck: Normal range of motion.  Cardiovascular: Normal rate.  Gallop: cervix is closed.   Respiratory: Effort normal.  GI: There is no tenderness.       gravid  Genitourinary: There is no tenderness or lesion on the right labia. There is no tenderness or lesion on the left labia. Uterus is enlarged (gravid). Uterus is not tender. Cervix exhibits  no motion tenderness and no friability. No bleeding around the vagina. Vaginal discharge (large amount of curd-like white discharge without odor) found.  Neurological: She is alert and oriented to person, place, and time.  Skin: Skin is warm and dry.  Psychiatric: She has a normal mood and affect. Her behavior is normal.   Results for orders placed during the hospital encounter of 03/16/12 (from the past 24 hour(s))  WET PREP, GENITAL     Status: Abnormal   Collection Time   03/16/12  2:15 PM      Component Value Range   Yeast Wet Prep HPF POC MANY (*) NONE SEEN   Trich, Wet Prep NONE SEEN  NONE SEEN   Clue Cells Wet Prep HPF POC NONE SEEN  NONE SEEN   WBC, Wet Prep HPF POC TOO NUMEROUS TO COUNT (*) NONE SEEN   MAU Course  Procedures  Fetal monitor strip is reactive.  MDM Reported MSE, physical exam findings, and lab findings to Dr. Ambrose Mantle-  Order given to discharge her to home with Rx for Terazol or Diflucan.  Patient prefers creme  Assessment and Plan  A:  Yeast vaginitis at [redacted]w[redacted]d gestation  P:  Rx for Terazol 3 Vaginal Creme 1 applicator at hs X 3.      Keep scheduled appointment for continued prenatal care.   KEY,EVE M 03/16/2012, 2:08 PM

## 2012-03-20 ENCOUNTER — Inpatient Hospital Stay (HOSPITAL_COMMUNITY): Payer: Medicaid Other

## 2012-03-20 ENCOUNTER — Encounter (HOSPITAL_COMMUNITY): Payer: Self-pay | Admitting: *Deleted

## 2012-03-20 ENCOUNTER — Inpatient Hospital Stay (HOSPITAL_COMMUNITY)
Admission: AD | Admit: 2012-03-20 | Discharge: 2012-03-20 | Disposition: A | Discharge status: Home / Self Care | Payer: Medicaid Other | Source: Ambulatory Visit | Attending: Obstetrics and Gynecology | Admitting: Obstetrics and Gynecology

## 2012-03-20 DIAGNOSIS — M79609 Pain in unspecified limb: Secondary | ICD-10-CM

## 2012-03-20 DIAGNOSIS — O99891 Other specified diseases and conditions complicating pregnancy: Secondary | ICD-10-CM | POA: Insufficient documentation

## 2012-03-20 MED ORDER — HYDROCODONE-ACETAMINOPHEN 5-325 MG PO TABS
2.0000 | ORAL_TABLET | Freq: Once | ORAL | Status: AC
  Administered 2012-03-20: 2 via ORAL
  Filled 2012-03-20: qty 2

## 2012-03-20 NOTE — MAU Note (Signed)
Pain L leg since 1800. Sharp pains that comes and goes but as soon as it stops it comes back. Pain mostly comes up from foot to inside of leg and up to thigh. Thigh doesn't hurt as much, just my leg.

## 2012-03-20 NOTE — MAU Provider Note (Signed)
Chief Complaint:  Leg Pain  First Provider Initiated Contact with Patient 03/20/12 0348     HPI  Linda Barry is a 21 y.o. G2P1001 at [redacted]w[redacted]d who arrived by EMS with severe left inner lower extremity pain since 1800. Gradual onset, now rates pain 9/10. Tx w/ rest, no meds. Denies contractions, leakage of fluid or vaginal bleeding. Good fetal movement. No Hx blood clots. Denies recent injury, SOB, calf pain, warmth or swelling of extremity or recent travel or immobilization.   Past Medical History: Past Medical History  Diagnosis Date  . Depression   . Sickle cell trait     Past Surgical History: History reviewed. No pertinent past surgical history.  Family History: Family History  Problem Relation Age of Onset  . Other Neg Hx     Social History: History  Substance Use Topics  . Smoking status: Former Smoker -- 0.2 packs/day    Quit date: 02/01/2012  . Smokeless tobacco: Never Used  . Alcohol Use: No    Allergies: No Known Allergies  Meds:  Prescriptions prior to admission  Medication Sig Dispense Refill  . miconazole (MONISTAT-3) KIT Place vaginally at bedtime.      Marland Kitchen terconazole (TERAZOL 3) 0.8 % vaginal cream Place 1 applicator vaginally at bedtime.  20 g  0      Physical Exam  Blood pressure 111/76, pulse 106, temperature 98.6 F (37 C), resp. rate 22, height 5\' 6"  (1.676 m), weight 74.934 kg (165 lb 3.2 oz), last menstrual period 07/14/2011. GENERAL: Well-developed, well-nourished female in moderate distress initially.  HEENT: normocephalic HEART: normal rate RESP: normal effort ABDOMEN: Soft, nontender, gravid appropriate for gestational age EXTREMITIES: Nontender, no edema, erythema, warmth, bruising. Small area of telangiectasia on inner left knee. Neg Homan's.  Normal ROM.  NEURO: alert and oriented SPECULUM EXAM: Deferred FHT:  Baseline 140 , moderate variability, accelerations present, no decelerations Contractions: Irreg, mild   Labs:  Imaging:    Dg Tibia/fibula Left  03/20/2012  *RADIOLOGY REPORT*  Clinical Data: Left leg pain.  LEFT TIBIA AND FIBULA - 2 VIEW  Comparison: None.  Findings: There is no evidence of fracture or dislocation.  The tibia and fibula appear intact.  The proximal tibia and fibula are incompletely imaged on this study.  The ankle mortise is incompletely assessed, but appears grossly unremarkable.  No significant soft tissue abnormalities are characterized on radiograph.  IMPRESSION: No evidence of fracture or dislocation.  Original Report Authenticated By: Tonia Ghent, M.D.   ED Course Reviewed findings w/ Dr,Henley. Ordered venous dopplers and X-Ray. Dopplers not available until 0700.  Sleeping after Vicodin.  Care of pt turned over to [facility], NP at 0800.  Katrinka Blazing, Onesty Clair 03/20/2012 8:00 AM    Assessment:    Plan:

## 2012-03-20 NOTE — Progress Notes (Signed)
VASCULAR LAB PRELIMINARY  PRELIMINARY  PRELIMINARY  PRELIMINARY  Left lower extremity venous Doppler completed.    Preliminary report:  There is no DVT or SVT noted in the left lower extremity.  Vonnetta Akey, 03/20/2012, 9:30 AM

## 2012-03-20 NOTE — MAU Note (Signed)
Pt to radiology via w/c

## 2012-03-20 NOTE — MAU Note (Signed)
Pt arrived via [facility]. Pt ambulatory and able to sign in at front desk. THen ambulated to RM #5 with a limp to L leg.

## 2012-03-20 NOTE — MAU Note (Signed)
Pt returned from xray. Ok to leave pt off efm per Ivonne Andrew CNM.

## 2012-03-28 ENCOUNTER — Inpatient Hospital Stay (HOSPITAL_COMMUNITY)
Admission: AD | Admit: 2012-03-28 | Discharge: 2012-03-28 | Disposition: A | Discharge status: Home / Self Care | Payer: Medicaid Other | Source: Ambulatory Visit | Attending: Obstetrics and Gynecology | Admitting: Obstetrics and Gynecology

## 2012-03-28 ENCOUNTER — Encounter (HOSPITAL_COMMUNITY): Payer: Self-pay

## 2012-03-28 DIAGNOSIS — O479 False labor, unspecified: Secondary | ICD-10-CM | POA: Insufficient documentation

## 2012-03-28 MED ORDER — ONDANSETRON 8 MG PO TBDP
8.0000 mg | ORAL_TABLET | Freq: Once | ORAL | Status: AC
  Administered 2012-03-28: 8 mg via ORAL
  Filled 2012-03-28: qty 1

## 2012-03-28 NOTE — MAU Note (Signed)
RN had wrong pt in OBIX program. EFM tracing charting under wrong pt. From 1007 to present, pt's actual efm tracing was charted under MRN [id number]

## 2012-03-28 NOTE — Discharge Instructions (Signed)
Normal Labor and Delivery Your caregiver must first be sure you are in labor. Signs of labor include:  You may pass what is called "the mucus plug" before labor begins. This is a small amount of blood stained mucus.   Regular uterine contractions.   The time between contractions get closer together.   The discomfort and pain gradually gets more intense.   Pains are mostly located in the back.   Pains get worse when walking.   The cervix (the opening of the uterus becomes thinner (begins to efface) and opens up (dilates).  Once you are in labor and admitted into the hospital or care center, your caregiver will do the following:  A complete physical examination.   Check your vital signs (blood pressure, pulse, temperature and the fetal heart rate).   Do a vaginal examination (using a sterile glove and lubricant) to determine:   The position (presentation) of the baby (head [vertex] or buttock first).   The level (station) of the baby's head in the birth canal.   The effacement and dilatation of the cervix.   You may have your pubic hair shaved and be given an enema depending on your caregiver and the circumstance.   An electronic monitor is usually placed on your abdomen. The monitor follows the length and intensity of the contractions, as well as the baby's heart rate.   Usually, your caregiver will insert an IV in your arm with a bottle of sugar water. This is done as a precaution so that medications can be given to you quickly during labor or delivery.  NORMAL LABOR AND DELIVERY IS DIVIDED UP INTO 3 STAGES: First Stage This is when regular contractions begin and the cervix begins to efface and dilate. This stage can last from 3 to 15 hours. The end of the first stage is when the cervix is 100% effaced and 10 centimeters dilated. Pain medications may be given by   Injection (morphine, demerol, etc.)   Regional anesthesia (spinal, caudal or epidural, anesthetics given in  different locations of the spine). Paracervical pain medication may be given, which is an injection of and anesthetic on each side of the cervix.  A pregnant woman may request to have "Natural Childbirth" which is not to have any medications or anesthesia during her labor and delivery. Second Stage This is when the baby comes down through the birth canal (vagina) and is born. This can take 1 to 4 hours. As the baby's head comes down through the birth canal, you may feel like you are going to have a bowel movement. You will get the urge to bear down and push until the baby is delivered. As the baby's head is being delivered, the caregiver will decide if an episiotomy (a cut in the perineum and vagina area) is needed to prevent tearing of the tissue in this area. The episiotomy is sewn up after the delivery of the baby and placenta. Sometimes a mask with nitrous oxide is given for the mother to breath during the delivery of the baby to help if there is too much pain. The end of Stage 2 is when the baby is fully delivered. Then when the umbilical cord stops pulsating it is clamped and cut. Third Stage The third stage begins after the baby is completely delivered and ends after the placenta (afterbirth) is delivered. This usually takes 5 to 30 minutes. After the placenta is delivered, a medication is given either by intravenous or injection to help contract   the uterus and prevent bleeding. The third stage is not painful and pain medication is usually not necessary. If an episiotomy was done, it is repaired at this time. After the delivery, the mother is watched and monitored closely for 1 to 2 hours to make sure there is no postpartum bleeding (hemorrhage). If there is a lot of bleeding, medication is given to contract the uterus and stop the bleeding. Document Released: 06/09/2008 Document Revised: 08/20/2011 Document Reviewed: 06/09/2008 Exit[employer] Patient Information 2012 [employer]. 
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
## 2012-03-28 NOTE — MAU Note (Signed)
No adverse reaction to zofran, states nausea relieved

## 2012-03-28 NOTE — MAU Note (Signed)
Dr. Ellyn Hack notified pt vomited, order for 8mg  ODT zofran given, then RN to d/c pt to home.

## 2012-03-28 NOTE — MAU Note (Signed)
Pains started 0600, denies bleeding or leaking

## 2012-03-28 NOTE — MAU Note (Signed)
Pt states ctx's began this am, came via EMS, pt crying, hysterical. Denies bleeding or lof.
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
## 2012-03-28 NOTE — MAU Note (Signed)
Dr. Ellyn Hack notified of pt's c/o ctx's, cervix 1.5/60/-3 vertex, intact. Ctx's q1-4 minutes apart. Will recheck pt's cervix and call with results.
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
## 2012-03-31 LAB — CULTURE, BETA STREP (GROUP B ONLY): Special Requests: NORMAL

## 2012-04-15 ENCOUNTER — Encounter (HOSPITAL_COMMUNITY): Payer: Self-pay | Admitting: Obstetrics and Gynecology

## 2012-04-15 ENCOUNTER — Inpatient Hospital Stay (HOSPITAL_COMMUNITY): Payer: Medicaid Other | Admitting: Anesthesiology

## 2012-04-15 ENCOUNTER — Encounter (HOSPITAL_COMMUNITY): Payer: Self-pay | Admitting: Anesthesiology

## 2012-04-15 ENCOUNTER — Inpatient Hospital Stay (HOSPITAL_COMMUNITY)
Admission: AD | Admit: 2012-04-15 | Discharge: 2012-04-17 | DRG: 774 | Disposition: A | Discharge status: Home / Self Care | Payer: Medicaid Other | Source: Ambulatory Visit | Attending: Obstetrics and Gynecology | Admitting: Obstetrics and Gynecology

## 2012-04-15 DIAGNOSIS — Z22330 Carrier of Group B streptococcus: Secondary | ICD-10-CM

## 2012-04-15 DIAGNOSIS — O4590 Premature separation of placenta, unspecified, unspecified trimester: Secondary | ICD-10-CM

## 2012-04-15 DIAGNOSIS — Z3490 Encounter for supervision of normal pregnancy, unspecified, unspecified trimester: Secondary | ICD-10-CM

## 2012-04-15 DIAGNOSIS — O99892 Other specified diseases and conditions complicating childbirth: Principal | ICD-10-CM | POA: Diagnosis present

## 2012-04-15 HISTORY — DX: Premature separation of placenta, unspecified, unspecified trimester: O45.90

## 2012-04-15 LAB — CBC
HCT: 31.6 % — ABNORMAL LOW (ref 36.0–46.0)
Hemoglobin: 10.6 g/dL — ABNORMAL LOW (ref 12.0–15.0)
RDW: 14.4 % (ref 11.5–15.5)
WBC: 10.3 10*3/uL (ref 4.0–10.5)

## 2012-04-15 LAB — OB RESULTS CONSOLE RUBELLA ANTIBODY, IGM: Rubella: IMMUNE

## 2012-04-15 LAB — OB RESULTS CONSOLE HIV ANTIBODY (ROUTINE TESTING): HIV: NONREACTIVE

## 2012-04-15 LAB — OB RESULTS CONSOLE GC/CHLAMYDIA
Chlamydia: NEGATIVE
Gonorrhea: NEGATIVE

## 2012-04-15 LAB — OB RESULTS CONSOLE RPR: RPR: NONREACTIVE

## 2012-04-15 LAB — OB RESULTS CONSOLE ANTIBODY SCREEN: Antibody Screen: NEGATIVE

## 2012-04-15 LAB — OB RESULTS CONSOLE HEPATITIS B SURFACE ANTIGEN: Hepatitis B Surface Ag: NEGATIVE

## 2012-04-15 MED ORDER — LACTATED RINGERS IV SOLN
INTRAVENOUS | Status: DC
  Administered 2012-04-16: 01:00:00 via INTRAVENOUS

## 2012-04-15 MED ORDER — BUTORPHANOL TARTRATE 1 MG/ML IJ SOLN: 1.0000 mg | INTRAMUSCULAR | Status: DC | PRN

## 2012-04-15 MED ORDER — SENNOSIDES-DOCUSATE SODIUM 8.6-50 MG PO TABS
2.0000 | ORAL_TABLET | Freq: Every day | ORAL | Status: DC
  Administered 2012-04-15 – 2012-04-16 (×2): 2 via ORAL

## 2012-04-15 MED ORDER — WITCH HAZEL-GLYCERIN EX PADS: 1.0000 "application " | MEDICATED_PAD | CUTANEOUS | Status: DC | PRN

## 2012-04-15 MED ORDER — PNEUMOCOCCAL VAC POLYVALENT 25 MCG/0.5ML IJ INJ
0.5000 mL | INJECTION | INTRAMUSCULAR | Status: DC
  Filled 2012-04-15: qty 0.5

## 2012-04-15 MED ORDER — ONDANSETRON HCL 4 MG/2ML IJ SOLN: 4.0000 mg | INTRAMUSCULAR | Status: DC | PRN

## 2012-04-15 MED ORDER — IBUPROFEN 600 MG PO TABS
600.0000 mg | ORAL_TABLET | Freq: Four times a day (QID) | ORAL | Status: DC
  Administered 2012-04-15 – 2012-04-17 (×7): 600 mg via ORAL
  Filled 2012-04-15 (×5): qty 1

## 2012-04-15 MED ORDER — BENZOCAINE-MENTHOL 20-0.5 % EX AERO
1.0000 "application " | INHALATION_SPRAY | CUTANEOUS | Status: DC | PRN
  Administered 2012-04-15: 1 via TOPICAL
  Filled 2012-04-15: qty 56

## 2012-04-15 MED ORDER — EPHEDRINE 5 MG/ML INJ
10.0000 mg | INTRAVENOUS | Status: DC | PRN
  Filled 2012-04-15: qty 4

## 2012-04-15 MED ORDER — DIPHENHYDRAMINE HCL 50 MG/ML IJ SOLN: 12.5000 mg | INTRAMUSCULAR | Status: DC | PRN

## 2012-04-15 MED ORDER — OXYTOCIN BOLUS FROM INFUSION
250.0000 mL | Freq: Once | INTRAVENOUS | Status: AC
  Administered 2012-04-15: 250 mL via INTRAVENOUS
  Filled 2012-04-15: qty 500

## 2012-04-15 MED ORDER — FLEET ENEMA 7-19 GM/118ML RE ENEM: 1.0000 | ENEMA | RECTAL | Status: DC | PRN

## 2012-04-15 MED ORDER — PHENYLEPHRINE 40 MCG/ML (10ML) SYRINGE FOR IV PUSH (FOR BLOOD PRESSURE SUPPORT): 80.0000 ug | PREFILLED_SYRINGE | INTRAVENOUS | Status: DC | PRN

## 2012-04-15 MED ORDER — OXYTOCIN 40 UNITS IN LACTATED RINGERS INFUSION - SIMPLE MED
62.5000 mL/h | Freq: Once | INTRAVENOUS | Status: AC
  Administered 2012-04-15: 62.5 mL/h via INTRAVENOUS
  Filled 2012-04-15: qty 1000

## 2012-04-15 MED ORDER — OXYCODONE-ACETAMINOPHEN 5-325 MG PO TABS
1.0000 | ORAL_TABLET | ORAL | Status: DC | PRN
  Administered 2012-04-16 (×3): 1 via ORAL
  Filled 2012-04-15 (×3): qty 1

## 2012-04-15 MED ORDER — ACETAMINOPHEN 325 MG PO TABS: 650.0000 mg | ORAL_TABLET | ORAL | Status: DC | PRN

## 2012-04-15 MED ORDER — DIPHENHYDRAMINE HCL 25 MG PO CAPS: 25.0000 mg | ORAL_CAPSULE | Freq: Four times a day (QID) | ORAL | Status: DC | PRN

## 2012-04-15 MED ORDER — CITRIC ACID-SODIUM CITRATE 334-500 MG/5ML PO SOLN: 30.0000 mL | ORAL | Status: DC | PRN

## 2012-04-15 MED ORDER — DIBUCAINE 1 % RE OINT: 1.0000 "application " | TOPICAL_OINTMENT | RECTAL | Status: DC | PRN

## 2012-04-15 MED ORDER — LACTATED RINGERS IV SOLN
INTRAVENOUS | Status: DC
  Administered 2012-04-15 (×2): via INTRAVENOUS

## 2012-04-15 MED ORDER — DEXTROSE 5 % IV SOLN
5.0000 10*6.[IU] | Freq: Once | INTRAVENOUS | Status: DC
  Filled 2012-04-15: qty 5

## 2012-04-15 MED ORDER — PENICILLIN G POTASSIUM 5000000 UNITS IJ SOLR
2.5000 10*6.[IU] | INTRAVENOUS | Status: DC
  Filled 2012-04-15: qty 2.5

## 2012-04-15 MED ORDER — LIDOCAINE HCL (PF) 1 % IJ SOLN
INTRAMUSCULAR | Status: DC | PRN
  Administered 2012-04-15 (×2): 5 mL

## 2012-04-15 MED ORDER — OXYCODONE-ACETAMINOPHEN 5-325 MG PO TABS: 1.0000 | ORAL_TABLET | ORAL | Status: DC | PRN

## 2012-04-15 MED ORDER — IBUPROFEN 600 MG PO TABS: 600.0000 mg | ORAL_TABLET | Freq: Four times a day (QID) | ORAL | Status: DC | PRN

## 2012-04-15 MED ORDER — ONDANSETRON HCL 4 MG PO TABS: 4.0000 mg | ORAL_TABLET | ORAL | Status: DC | PRN

## 2012-04-15 MED ORDER — SODIUM CHLORIDE 0.9 % IV SOLN
2.0000 g | Freq: Four times a day (QID) | INTRAVENOUS | Status: DC
  Administered 2012-04-15: 2 g via INTRAVENOUS
  Filled 2012-04-15 (×4): qty 2000

## 2012-04-15 MED ORDER — SIMETHICONE 80 MG PO CHEW: 80.0000 mg | CHEWABLE_TABLET | ORAL | Status: DC | PRN

## 2012-04-15 MED ORDER — ONDANSETRON HCL 4 MG/2ML IJ SOLN: 4.0000 mg | Freq: Four times a day (QID) | INTRAMUSCULAR | Status: DC | PRN

## 2012-04-15 MED ORDER — LACTATED RINGERS IV SOLN: 500.0000 mL | Freq: Once | INTRAVENOUS | Status: DC

## 2012-04-15 MED ORDER — LANOLIN HYDROUS EX OINT: TOPICAL_OINTMENT | CUTANEOUS | Status: DC | PRN

## 2012-04-15 MED ORDER — LACTATED RINGERS IV SOLN: 500.0000 mL | INTRAVENOUS | Status: DC | PRN

## 2012-04-15 MED ORDER — PRENATAL MULTIVITAMIN CH
1.0000 | ORAL_TABLET | Freq: Every day | ORAL | Status: DC
  Administered 2012-04-16 – 2012-04-17 (×2): 1 via ORAL
  Filled 2012-04-15 (×2): qty 1

## 2012-04-15 MED ORDER — ZOLPIDEM TARTRATE 5 MG PO TABS: 5.0000 mg | ORAL_TABLET | Freq: Every evening | ORAL | Status: DC | PRN

## 2012-04-15 MED ORDER — TETANUS-DIPHTH-ACELL PERTUSSIS 5-2.5-18.5 LF-MCG/0.5 IM SUSP: 0.5000 mL | Freq: Once | INTRAMUSCULAR | Status: DC

## 2012-04-15 MED ORDER — LIDOCAINE HCL (PF) 1 % IJ SOLN
30.0000 mL | INTRAMUSCULAR | Status: DC | PRN
  Filled 2012-04-15: qty 30

## 2012-04-15 MED ORDER — EPHEDRINE 5 MG/ML INJ: 10.0000 mg | INTRAVENOUS | Status: DC | PRN

## 2012-04-15 MED ORDER — FENTANYL 2.5 MCG/ML BUPIVACAINE 1/10 % EPIDURAL INFUSION (WH - ANES)
14.0000 mL/h | INTRAMUSCULAR | Status: DC
  Administered 2012-04-15: 13 mL/h via EPIDURAL
  Administered 2012-04-15: 14 mL/h via EPIDURAL
  Filled 2012-04-15 (×2): qty 60

## 2012-04-15 MED ORDER — PHENYLEPHRINE 40 MCG/ML (10ML) SYRINGE FOR IV PUSH (FOR BLOOD PRESSURE SUPPORT)
80.0000 ug | PREFILLED_SYRINGE | INTRAVENOUS | Status: DC | PRN
  Filled 2012-04-15: qty 5

## 2012-04-15 NOTE — Anesthesia Procedure Notes (Signed)
Epidural Patient location during procedure: OB Start time: 04/15/2012 11:20 AM  Staffing Anesthesiologist: Brayton Caves R Performed by: anesthesiologist   Preanesthetic Checklist Completed: patient identified, site marked, surgical consent, pre-op evaluation, timeout performed, IV checked, risks and benefits discussed and monitors and equipment checked  Epidural Patient position: sitting Prep: site prepped and draped and DuraPrep Patient monitoring: continuous pulse ox and blood pressure Approach: midline Injection technique: LOR air and LOR saline  Needle:  Needle type: Tuohy  Needle gauge: 17 G Needle length: 9 cm Needle insertion depth: 5 cm cm Catheter type: closed end flexible Catheter size: 19 Gauge Catheter at skin depth: 10 cm Test dose: negative  Assessment Events: blood not aspirated, injection not painful, no injection resistance, negative IV test and no paresthesia  Additional Notes Patient identified.  Risk benefits discussed including failed block, incomplete pain control, headache, nerve damage, paralysis, blood pressure changes, nausea, vomiting, reactions to medication both toxic or allergic, and postpartum back pain.  Patient expressed understanding and wished to proceed.  All questions were answered.  Sterile technique used throughout procedure and epidural site dressed with sterile barrier dressing. No paresthesia or other complications noted.The patient did not experience any signs of intravascular injection such as tinnitus or metallic taste in mouth nor signs of intrathecal spread such as rapid motor block. Please see nursing notes for vital signs.

## 2012-04-15 NOTE — Anesthesia Preprocedure Evaluation (Signed)
Anesthesia Evaluation  Patient identified by MRN, date of birth, ID band Patient awake    Reviewed: Allergy & Precautions, H&P , Patient's Chart, lab work & pertinent test results  Airway Mallampati: II TM Distance: >3 FB Neck ROM: full    Dental No notable dental hx.    Pulmonary neg pulmonary ROS,  breath sounds clear to auscultation  Pulmonary exam normal       Cardiovascular negative cardio ROS  Rhythm:regular Rate:Normal     Neuro/Psych negative neurological ROS  negative psych ROS   GI/Hepatic negative GI ROS, Neg liver ROS,   Endo/Other  negative endocrine ROS  Renal/GU negative Renal ROS     Musculoskeletal   Abdominal   Peds  Hematology negative hematology ROS (+)   Anesthesia Other Findings Depression     Sickle cell trait        Normal pregnancy    Reproductive/Obstetrics (+) Pregnancy                           Anesthesia Physical Anesthesia Plan  ASA: II  Anesthesia Plan: Epidural   Post-op Pain Management:    Induction:   Airway Management Planned:   Additional Equipment:   Intra-op Plan:   Post-operative Plan:   Informed Consent: I have reviewed the patients History and Physical, chart, labs and discussed the procedure including the risks, benefits and alternatives for the proposed anesthesia with the patient or authorized representative who has indicated his/her understanding and acceptance.     Plan Discussed with:   Anesthesia Plan Comments:         Anesthesia Quick Evaluation

## 2012-04-15 NOTE — Plan of Care (Signed)
Problem: Phase I Progression Outcomes Goal: Other Phase I Outcomes/Goals Outcome: Progressing Post partum hemmorrhage protocol cbc in am.

## 2012-04-15 NOTE — MAU Note (Signed)
Arrived EMS ctx since 3am about 3-4 min apart. No bleedingor leaking.

## 2012-04-15 NOTE — H&P (Signed)
Linda Barry is a 21 y.o. female G2P1001 at 39+ in active labor.  Uncomplicated prenatal care - except limited.  + GBBS, Hgb C trait.  +FM, no LOF, no VB, ctx - increasing in intensity and frequency. Maternal Medical History:  Reason for admission: Reason for admission: contractions.  Contractions: Frequency: regular.   Perceived severity is strong.    Fetal activity: Perceived fetal activity is normal.      OB History    Grav Para Term Preterm Abortions TAB SAB Ect Mult Living   2 1 1       1     G1 VAVD, 7 #, female; G2 present; no pap; Chl x 2, EGW  Past Medical History  Diagnosis Date  . Depression   . Sickle cell trait   . Normal pregnancy 04/15/2012   History reviewed. No pertinent past surgical history. Family History: sickle cell trait and disease, HTN, DM Social History:  reports that she quit smoking about 2 months ago. She has never used smokeless tobacco. She reports that she does not drink alcohol or use illicit drugs. Meds PNV All NKDA   Prenatal Transfer Tool  Maternal Diabetes: No Genetic Screening: Normal Maternal Ultrasounds/Referrals: Normal Fetal Ultrasounds or other Referrals:  None Maternal Substance Abuse:  No Significant Maternal Medications:  None Significant Maternal Lab Results:  Lab values include: Group B Strep positive Other Comments:  limited PNC  Review of Systems  Constitutional: Negative.   HENT: Negative.   Eyes: Negative.   Respiratory: Negative.   Cardiovascular: Negative.   Gastrointestinal: Negative.   Genitourinary: Negative.   Musculoskeletal: Negative.   Skin: Negative.   Neurological: Negative.   Psychiatric/Behavioral: Negative.     Dilation: 6 Effacement (%): 80 Station: -1;0 Exam by:: E. [facility] RNC Blood pressure 118/78, pulse 122, temperature 97.6 F (36.4 C), temperature source Oral, resp. rate 16, height 5' 4.5" (1.638 m), weight 79.379 kg (175 lb), last menstrual period 07/14/2011. Maternal Exam:  Uterine  Assessment: Contraction strength is firm.  Contraction frequency is regular.   Abdomen: Fundal height is appropriate for gestation.   Estimated fetal weight is 7-8#.   Fetal presentation: vertex  Pelvis: adequate for delivery.      Physical Exam  Constitutional: She is oriented to person, place, and time. She appears well-developed and well-nourished.  HENT:  Head: Normocephalic and atraumatic.  Eyes: Conjunctivae are normal. Pupils are equal, round, and reactive to light.  Neck: Normal range of motion. Neck supple. No thyromegaly present.  Cardiovascular: Normal rate and regular rhythm.   Respiratory: Effort normal and breath sounds normal. No respiratory distress.  GI: Soft. Bowel sounds are normal. There is no tenderness.  Musculoskeletal: Normal range of motion.  Neurological: She is alert and oriented to person, place, and time.  Skin: Skin is warm and dry.  Psychiatric: She has a normal mood and affect. Her behavior is normal.    Prenatal labs: ABO, Rh: --/--/O POS (12/19 1633) Antibody: Negative (08/02 1052) Rubella: Immune (08/02 1052) RPR: Nonreactive (08/02 1052)  HBsAg: Negative (08/02 1052)  HIV: Non-reactive (08/02 1052)  GBS: Positive (07/29 0000)  Hgb 11.4/ Ur Cx neg/ Plt 313K/ Hgb electro - C trait/ GC neg/ Chl neg/ CF neg/ AFP WNL/ glucola 89  Tdap 5/16  [place] nl anat, ant plac, female  Assessment/Plan: 21yo G2P1001 at 39+ in active labor Ampicillin for gbbs positive Epidural for comfort Expect SVD   BOVARD,[name] 04/15/2012, 11:04 AM

## 2012-04-16 LAB — CBC
HCT: 26.2 % — ABNORMAL LOW (ref 36.0–46.0)
Hemoglobin: 9.1 g/dL — ABNORMAL LOW (ref 12.0–15.0)
MCH: 25.6 pg — ABNORMAL LOW (ref 26.0–34.0)
MCV: 73.6 fL — ABNORMAL LOW (ref 78.0–100.0)
Platelets: 212 10*3/uL (ref 150–400)
RBC: 3.56 MIL/uL — ABNORMAL LOW (ref 3.87–5.11)

## 2012-04-16 NOTE — Progress Notes (Signed)
Post Partum Day 1 Subjective: no complaints, up ad lib, tolerating PO and nl lochia, pain controlled  Objective: Blood pressure 112/70, pulse 66, temperature 98.1 F (36.7 C), temperature source Oral, resp. rate 18, height 5' 4.5" (1.638 m), weight 79.379 kg (175 lb), last menstrual period 07/14/2011, SpO2 100.00%, unknown if currently breastfeeding.  Physical Exam:  General: alert and no distress Lochia: appropriate Uterine Fundus: firm   Basename 04/16/12 0550 04/15/12 1040  HGB 9.1* 10.6*  HCT 26.2* 31.6*    Assessment/Plan: Plan for discharge tomorrow   LOS: 1 day   BOVARD,Tiburcio Linder 04/16/2012, 9:32 AM

## 2012-04-16 NOTE — Anesthesia Postprocedure Evaluation (Signed)
  Anesthesia Post-op Note  Patient: Linda Barry  Procedure(s) Performed: * No procedures listed *  Patient Location: PACU and Mother/Baby  Anesthesia Type: Epidural  Level of Consciousness: awake, alert , oriented and patient cooperative  Airway and Oxygen Therapy: Patient Spontanous Breathing  Post-op Pain: none  Post-op Assessment: Post-op Vital signs reviewed, No signs of Nausea or vomiting, Adequate PO intake, No headache, No backache, No residual numbness and No residual motor weakness  Post-op Vital Signs: Reviewed and stable  Complications: No apparent anesthesia complications

## 2012-04-17 MED ORDER — PRENATAL MULTIVITAMIN CH: 1.0000 | ORAL_TABLET | Freq: Every day | ORAL | Status: DC

## 2012-04-17 MED ORDER — IBUPROFEN 800 MG PO TABS: 800.0000 mg | ORAL_TABLET | Freq: Three times a day (TID) | ORAL | Status: AC | PRN

## 2012-04-17 MED ORDER — OXYCODONE-ACETAMINOPHEN 5-325 MG PO TABS: 1.0000 | ORAL_TABLET | Freq: Four times a day (QID) | ORAL | Status: AC | PRN

## 2012-04-17 NOTE — Progress Notes (Signed)
Post Partum Day 1 Subjective: no complaints, up ad lib, tolerating PO and nl lochia, pain controlled  Objective: Blood pressure 128/93, pulse 74, temperature 98.2 F (36.8 C), temperature source Oral, resp. rate 18, height 5' 4.5" (1.638 m), weight 79.379 kg (175 lb), last menstrual period 07/14/2011, SpO2 100.00%, unknown if currently breastfeeding.  Physical Exam:  General: alert and no distress Lochia: appropriate Uterine Fundus: firm   Basename 04/16/12 0550 04/15/12 1040  HGB 9.1* 10.6*  HCT 26.2* 31.6*    Assessment/Plan: Discharge home.  D/C with Motrin/percocet/ pnv; f/u 6 weeks   LOS: 2 days   BOVARD,Beck Cofer 04/17/2012, 9:32 AM

## 2012-04-17 NOTE — Discharge Summary (Signed)
Obstetric Discharge Summary Reason for Admission: onset of labor Prenatal Procedures: none Intrapartum Procedures: spontaneous vaginal delivery Postpartum Procedures: none Complications-Operative and Postpartum: abruption Hemoglobin  Date Value Range Status  04/16/2012 9.1* 12.0 - 15.0 g/dL Final     HCT  Date Value Range Status  04/16/2012 26.2* 36.0 - 46.0 % Final    Physical Exam:  General: alert and no distress Lochia: appropriate Uterine Fundus: firm  Discharge Diagnoses: Term Pregnancy-delivered  Discharge Information: Date: 04/17/2012 Activity: pelvic rest Diet: routine Medications: PNV, Ibuprofen and Percocet Condition: stable Instructions: refer to practice specific booklet Discharge to: home Follow-up Information    Follow up with BOVARD,Kilan Banfill, MD in 6 weeks.   Contact information:   [address] [facility] [address] [place] [address] [phone]          Newborn Data: Live born female  Birth Weight: 7 lb 8 oz (3402 g) APGAR: 8, 9  Home with mother.  BOVARD,Nicholas Ossa 04/17/2012, 9:54 AM

## 2012-04-18 LAB — TYPE AND SCREEN
ABO/RH(D): O POS
Antibody Screen: NEGATIVE
Unit division: 0

## 2012-04-18 NOTE — Progress Notes (Signed)
Post discharge chart review completed.  

## 2012-07-08 ENCOUNTER — Emergency Department (HOSPITAL_COMMUNITY)
Admission: EM | Admit: 2012-07-08 | Discharge: 2012-07-08 | Discharge status: Absent without leave | Payer: Medicaid Other | Source: Home / Self Care | Attending: Emergency Medicine | Admitting: Emergency Medicine

## 2012-07-08 NOTE — ED Notes (Signed)
Pt not present in waiting area x2

## 2012-07-08 NOTE — ED Notes (Signed)
Patient not present in waiting area when called x1

## 2013-04-29 ENCOUNTER — Emergency Department (HOSPITAL_COMMUNITY)
Admission: EM | Admit: 2013-04-29 | Discharge: 2013-04-29 | Disposition: A | Discharge status: Home / Self Care | Payer: Medicaid Other | Attending: Emergency Medicine | Admitting: Emergency Medicine

## 2013-04-29 ENCOUNTER — Encounter (HOSPITAL_COMMUNITY): Payer: Self-pay | Admitting: *Deleted

## 2013-04-29 DIAGNOSIS — H109 Unspecified conjunctivitis: Secondary | ICD-10-CM | POA: Insufficient documentation

## 2013-04-29 DIAGNOSIS — Z8659 Personal history of other mental and behavioral disorders: Secondary | ICD-10-CM | POA: Insufficient documentation

## 2013-04-29 DIAGNOSIS — Z8742 Personal history of other diseases of the female genital tract: Secondary | ICD-10-CM | POA: Insufficient documentation

## 2013-04-29 DIAGNOSIS — Z87891 Personal history of nicotine dependence: Secondary | ICD-10-CM | POA: Insufficient documentation

## 2013-04-29 DIAGNOSIS — Z862 Personal history of diseases of the blood and blood-forming organs and certain disorders involving the immune mechanism: Secondary | ICD-10-CM | POA: Insufficient documentation

## 2013-04-29 MED ORDER — POLYMYXIN B-TRIMETHOPRIM 10000-0.1 UNIT/ML-% OP SOLN
2.0000 [drp] | Freq: Once | OPHTHALMIC | Status: AC
  Administered 2013-04-29: 2 [drp] via OPHTHALMIC
  Filled 2013-04-29: qty 10

## 2013-04-29 NOTE — ED Provider Notes (Signed)
  CSN: [id number]     Arrival date & time 04/29/13  1336 History     First MD Initiated Contact with Patient 04/29/13 1351     Chief Complaint  Patient presents with  . Conjunctivitis   (Consider location/radiation/quality/duration/timing/severity/associated sxs/prior Treatment) HPI Comments: Patient presents with complaint of right eye redness for the past 2 days. She has not noted drainage or discharge. No vision change. No history or suspicion of foreign body. Patient's daughter had pink eye a few days ago and the patient thinks that she contracted this. No fever, nausea, vomiting, or diarrhea. Onset of symptoms gradual. Course is constant. Nothing makes symptoms better worse. Patient does not wear contact lenses.  Patient is a 22 y.o. female presenting with conjunctivitis. The history is provided by the patient.  Conjunctivitis Pertinent negatives include no fever, headaches, myalgias, nausea, rash, sore throat or vomiting.    Past Medical History  Diagnosis Date  . Depression   . Sickle cell trait   . Normal pregnancy 04/15/2012  . SVD (spontaneous vaginal delivery) 04/15/2012  . Placenta abruption, delivered, current hospitalization 04/15/2012   History reviewed. No pertinent past surgical history. Family History  Problem Relation Age of Onset  . Other Neg Hx    History  Substance Use Topics  . Smoking status: Former Smoker -- 0.25 packs/day    Quit date: 02/01/2012  . Smokeless tobacco: Never Used  . Alcohol Use: No   OB History   Grav Para Term Preterm Abortions TAB SAB Ect Mult Living   2 2 2       2      Review of Systems  Constitutional: Negative for fever.  HENT: Negative for sore throat and rhinorrhea.   Eyes: Positive for redness. Negative for photophobia, pain and visual disturbance.  Gastrointestinal: Negative for nausea and vomiting.  Musculoskeletal: Negative for myalgias.  Skin: Negative for rash.  Neurological: Negative for headaches.    Allergies   Review of patient's allergies indicates no known allergies.  Home Medications  No current outpatient prescriptions on file. BP 116/73  Temp(Src) 97.5 F (36.4 C) (Oral)  SpO2 98%  LMP 04/19/2013 Physical Exam  Nursing note and vitals reviewed. Constitutional: She appears well-developed and well-nourished.  HENT:  Head: Normocephalic and atraumatic.  Eyes: Lids are normal. Pupils are equal, round, and reactive to light. Right conjunctiva is injected. Right conjunctiva has no hemorrhage. Left conjunctiva is not injected. Left conjunctiva has no hemorrhage. Right eye exhibits normal extraocular motion. Left eye exhibits normal extraocular motion.  Neck: Normal range of motion. Neck supple.  Pulmonary/Chest: No respiratory distress.  Neurological: She is alert.  Skin: Skin is warm and dry.  Psychiatric: She has a normal mood and affect.    ED Course   Procedures (including critical care time)  Labs Reviewed - No data to display No results found. 1. Conjunctivitis    2:07 PM Patient seen and examined. Visual acuity performed.    Vital signs reviewed and are as follows: Filed Vitals:   04/29/13 1346  BP: 116/73  Temp: 97.5 F (36.4 C)   Counseled on use of polytrim drops and hygiene.    MDM  Patient with s/s c/w conjunctivitis. No foreign bodies noted. No surrounding erythema, swelling, vision changes/loss suspicious for orbital or periorbital cellulitis. No signs of iritis.  No symptoms of retinal detachment. No ophthalmologic emergency suspected.  Renne Crigler, PA-C 04/29/13 1410

## 2013-04-29 NOTE — ED Notes (Signed)
Pt. Has a c/o pink eye for 2 days.  Pt.'s daughter had pink eye a few days ago.  Pt. Denies n/v/d.

## 2013-04-30 NOTE — ED Provider Notes (Signed)
Medical screening examination/treatment/procedure(s) were performed by non-physician practitioner and as supervising physician I was immediately available for consultation/collaboration.   Marilou Barnfield, MD 04/30/13 1224 

## 2013-05-24 ENCOUNTER — Emergency Department (HOSPITAL_COMMUNITY)
Admission: EM | Admit: 2013-05-24 | Discharge: 2013-05-25 | Disposition: A | Discharge status: Home / Self Care | Payer: Medicaid Other | Attending: Emergency Medicine | Admitting: Emergency Medicine

## 2013-05-24 ENCOUNTER — Encounter (HOSPITAL_COMMUNITY): Payer: Self-pay

## 2013-05-24 DIAGNOSIS — Z3202 Encounter for pregnancy test, result negative: Secondary | ICD-10-CM | POA: Insufficient documentation

## 2013-05-24 DIAGNOSIS — B9689 Other specified bacterial agents as the cause of diseases classified elsewhere: Secondary | ICD-10-CM | POA: Insufficient documentation

## 2013-05-24 DIAGNOSIS — Z862 Personal history of diseases of the blood and blood-forming organs and certain disorders involving the immune mechanism: Secondary | ICD-10-CM | POA: Insufficient documentation

## 2013-05-24 DIAGNOSIS — N760 Acute vaginitis: Secondary | ICD-10-CM | POA: Insufficient documentation

## 2013-05-24 DIAGNOSIS — A499 Bacterial infection, unspecified: Secondary | ICD-10-CM | POA: Insufficient documentation

## 2013-05-24 DIAGNOSIS — Z8659 Personal history of other mental and behavioral disorders: Secondary | ICD-10-CM | POA: Insufficient documentation

## 2013-05-24 DIAGNOSIS — Z87891 Personal history of nicotine dependence: Secondary | ICD-10-CM | POA: Insufficient documentation

## 2013-05-24 NOTE — ED Notes (Signed)
Pt c/o light brown vaginal discharge and swelling x3 days

## 2013-05-25 LAB — WET PREP, GENITAL: Yeast Wet Prep HPF POC: NONE SEEN

## 2013-05-25 LAB — URINALYSIS, ROUTINE W REFLEX MICROSCOPIC
Leukocytes, UA: NEGATIVE
Nitrite: NEGATIVE
Specific Gravity, Urine: 1.007 (ref 1.005–1.030)
Urobilinogen, UA: 0.2 mg/dL (ref 0.0–1.0)
pH: 6.5 (ref 5.0–8.0)

## 2013-05-25 MED ORDER — METRONIDAZOLE 500 MG PO TABS: 500.0000 mg | ORAL_TABLET | Freq: Two times a day (BID) | ORAL | Status: DC

## 2013-05-25 NOTE — ED Notes (Signed)
Laboratory notified to add urine pregnancy test on pt.'s lab test .

## 2013-05-25 NOTE — ED Provider Notes (Signed)
CSN: [id number]     Arrival date & time 05/24/13  2310 History   First MD Initiated Contact with Patient 05/25/13 0105     Chief Complaint  Patient presents with  . SEXUALLY TRANSMITTED DISEASE   (Consider location/radiation/quality/duration/timing/severity/associated sxs/prior Treatment) The history is provided by the patient.   Patient here complaining of vaginal discharge and labial swelling. She denies any vaginal bleeding. Some dysuria without hematuria. Does note foul-smelling. Symptoms have been persistent and no treatment used prior to arrival. No prior history of same. Denies any flank pain or fever. No vomiting or diarrhea.   Past Medical History  Diagnosis Date  . Depression   . Sickle cell trait   . Normal pregnancy 04/15/2012  . SVD (spontaneous vaginal delivery) 04/15/2012  . Placenta abruption, delivered, current hospitalization 04/15/2012   History reviewed. No pertinent past surgical history. Family History  Problem Relation Age of Onset  . Other Neg Hx    History  Substance Use Topics  . Smoking status: Former Smoker -- 0.25 packs/day    Quit date: 02/01/2012  . Smokeless tobacco: Never Used  . Alcohol Use: No   OB History   Grav Para Term Preterm Abortions TAB SAB Ect Mult Living   2 2 2       2      Review of Systems  All other systems reviewed and are negative.    Allergies  Review of patient's allergies indicates no known allergies.  Home Medications   Current Outpatient Rx  Name  Route  Sig  Dispense  Refill  . etonogestrel (IMPLANON) 68 MG IMPL implant   Subcutaneous   Inject 1 each into the skin once.          BP 124/73  Pulse 82  Temp(Src) 98.3 F (36.8 C) (Oral)  Resp 16  Ht 5\' 6"  (1.676 m)  Wt 170 lb (77.111 kg)  BMI 27.45 kg/m2  SpO2 99%  LMP 04/19/2013 Physical Exam  Nursing note and vitals reviewed. Constitutional: She is oriented to person, place, and time. She appears well-developed and well-nourished.  Non-toxic  appearance. No distress.  HENT:  Head: Normocephalic and atraumatic.  Eyes: Conjunctivae, EOM and lids are normal. Pupils are equal, round, and reactive to light.  Neck: Normal range of motion. Neck supple. No tracheal deviation present. No mass present.  Cardiovascular: Normal rate, regular rhythm and normal heart sounds.  Exam reveals no gallop.   No murmur heard. Pulmonary/Chest: Effort normal and breath sounds normal. No stridor. No respiratory distress. She has no decreased breath sounds. She has no wheezes. She has no rhonchi. She has no rales.  Abdominal: Soft. Normal appearance and bowel sounds are normal. She exhibits no distension. There is no tenderness. There is no rebound and no CVA tenderness.  Genitourinary: There is no rash or tenderness on the right labia. There is no rash on the left labia. Vaginal discharge found.  Musculoskeletal: Normal range of motion. She exhibits no edema and no tenderness.  Neurological: She is alert and oriented to person, place, and time. She has normal strength. No cranial nerve deficit or sensory deficit. GCS eye subscore is 4. GCS verbal subscore is 5. GCS motor subscore is 6.  Skin: Skin is warm and dry. No abrasion and no rash noted.  Psychiatric: She has a normal mood and affect. Her speech is normal and behavior is normal.    ED Course  Procedures (including critical care time) Labs Review Labs Reviewed  GC/CHLAMYDIA PROBE AMP  WET PREP, GENITAL  URINALYSIS, ROUTINE W REFLEX MICROSCOPIC   Imaging Review No results found.  MDM  No diagnosis found. Patient to be treated for bacterial vaginosis. She has no abdominal pelvic pain at this time. She has no cervical motion tenderness. GC and Chlamydia sent   Toy Baker, MD 05/25/13 [phone]

## 2013-05-26 LAB — GC/CHLAMYDIA PROBE AMP
CT Probe RNA: NEGATIVE
GC Probe RNA: NEGATIVE

## 2013-08-13 ENCOUNTER — Emergency Department (HOSPITAL_COMMUNITY)
Admission: EM | Admit: 2013-08-13 | Discharge: 2013-08-13 | Disposition: A | Discharge status: Home / Self Care | Payer: Medicaid Other | Attending: Emergency Medicine | Admitting: Emergency Medicine

## 2013-08-13 ENCOUNTER — Encounter (HOSPITAL_COMMUNITY): Payer: Self-pay | Admitting: Emergency Medicine

## 2013-08-13 DIAGNOSIS — N760 Acute vaginitis: Secondary | ICD-10-CM | POA: Insufficient documentation

## 2013-08-13 DIAGNOSIS — Z862 Personal history of diseases of the blood and blood-forming organs and certain disorders involving the immune mechanism: Secondary | ICD-10-CM | POA: Insufficient documentation

## 2013-08-13 DIAGNOSIS — R319 Hematuria, unspecified: Secondary | ICD-10-CM | POA: Insufficient documentation

## 2013-08-13 DIAGNOSIS — R109 Unspecified abdominal pain: Secondary | ICD-10-CM | POA: Insufficient documentation

## 2013-08-13 DIAGNOSIS — F17200 Nicotine dependence, unspecified, uncomplicated: Secondary | ICD-10-CM | POA: Insufficient documentation

## 2013-08-13 DIAGNOSIS — N898 Other specified noninflammatory disorders of vagina: Secondary | ICD-10-CM | POA: Insufficient documentation

## 2013-08-13 DIAGNOSIS — N72 Inflammatory disease of cervix uteri: Secondary | ICD-10-CM | POA: Insufficient documentation

## 2013-08-13 DIAGNOSIS — B9689 Other specified bacterial agents as the cause of diseases classified elsewhere: Secondary | ICD-10-CM

## 2013-08-13 DIAGNOSIS — Z8659 Personal history of other mental and behavioral disorders: Secondary | ICD-10-CM | POA: Insufficient documentation

## 2013-08-13 DIAGNOSIS — Z3202 Encounter for pregnancy test, result negative: Secondary | ICD-10-CM | POA: Insufficient documentation

## 2013-08-13 LAB — URINALYSIS, ROUTINE W REFLEX MICROSCOPIC
Bilirubin Urine: NEGATIVE
Nitrite: NEGATIVE
Specific Gravity, Urine: 1.017 (ref 1.005–1.030)
Urobilinogen, UA: 0.2 mg/dL (ref 0.0–1.0)
pH: 6.5 (ref 5.0–8.0)

## 2013-08-13 LAB — URINE MICROSCOPIC-ADD ON

## 2013-08-13 LAB — POCT PREGNANCY, URINE: Preg Test, Ur: NEGATIVE

## 2013-08-13 LAB — WET PREP, GENITAL: Trich, Wet Prep: NONE SEEN

## 2013-08-13 MED ORDER — AZITHROMYCIN 250 MG PO TABS
1000.0000 mg | ORAL_TABLET | Freq: Once | ORAL | Status: AC
  Administered 2013-08-13: 1000 mg via ORAL
  Filled 2013-08-13: qty 4

## 2013-08-13 MED ORDER — METRONIDAZOLE 500 MG PO TABS: 500.0000 mg | ORAL_TABLET | Freq: Two times a day (BID) | ORAL | Status: DC

## 2013-08-13 MED ORDER — CEFTRIAXONE SODIUM 250 MG IJ SOLR
250.0000 mg | Freq: Once | INTRAMUSCULAR | Status: AC
  Administered 2013-08-13: 250 mg via INTRAMUSCULAR
  Filled 2013-08-13: qty 250

## 2013-08-13 NOTE — ED Provider Notes (Signed)
CSN: [id number]     Arrival date & time 08/13/13  1606 History   First MD Initiated Contact with Patient 08/13/13 1813     Chief Complaint  Patient presents with  . Urinary Tract Infection   (Consider location/radiation/quality/duration/timing/severity/associated sxs/prior Treatment) HPI Linda Barry is a 22 y.o. female who presents to emergency department complaining of urinary frequency, suprapubic pain, hematuria, and dysuria. States symptoms began 4 days ago. States has history of the same and was told she has had UTI in the past. Patient denies any fever, chills, malaise. Patient denies any nausea or vomiting. Patient denies any back pain. Patient states she is having mild white vaginal discharge, "worse with standing." Denies current pregnancy. Patient has not tried any treatments for this.  Past Medical History  Diagnosis Date  . Depression   . Sickle cell trait   . Normal pregnancy 04/15/2012  . SVD (spontaneous vaginal delivery) 04/15/2012  . Placenta abruption, delivered, current hospitalization 04/15/2012   History reviewed. No pertinent past surgical history. Family History  Problem Relation Age of Onset  . Other Neg Hx    History  Substance Use Topics  . Smoking status: Current Every Day Smoker -- 0.25 packs/day    Types: Cigarettes    Last Attempt to Quit: 02/01/2012  . Smokeless tobacco: Never Used  . Alcohol Use: No   OB History   Grav Para Term Preterm Abortions TAB SAB Ect Mult Living   2 2 2       2      Review of Systems  Constitutional: Negative for fever and chills.  Respiratory: Negative for cough, chest tightness and shortness of breath.   Cardiovascular: Negative for chest pain, palpitations and leg swelling.  Gastrointestinal: Positive for abdominal pain. Negative for nausea, vomiting and diarrhea.  Genitourinary: Positive for dysuria, urgency, frequency, hematuria and vaginal discharge. Negative for flank pain, vaginal bleeding, vaginal pain and pelvic  pain.  Musculoskeletal: Negative for arthralgias, myalgias, neck pain and neck stiffness.  Skin: Negative for rash.  Neurological: Negative for dizziness, weakness and headaches.  All other systems reviewed and are negative.    Allergies  Review of patient's allergies indicates no known allergies.  Home Medications   Current Outpatient Rx  Name  Route  Sig  Dispense  Refill  . etonogestrel (IMPLANON) 68 MG IMPL implant   Subcutaneous   Inject 1 each into the skin once.          BP 105/73  Pulse 89  Temp(Src) 97.5 F (36.4 C) (Oral)  Resp 16  Wt 179 lb 11.2 oz (81.511 kg)  SpO2 100% Physical Exam  Nursing note and vitals reviewed. Constitutional: She is oriented to person, place, and time. She appears well-developed and well-nourished. No distress.  HENT:  Head: Normocephalic.  Eyes: Conjunctivae are normal.  Neck: Neck supple.  Cardiovascular: Normal rate, regular rhythm and normal heart sounds.   Pulmonary/Chest: Effort normal and breath sounds normal. No respiratory distress. She has no wheezes. She has no rales.  Abdominal: Soft. Bowel sounds are normal. She exhibits no distension. There is no tenderness. There is no rebound.  No CVA tenderness  Genitourinary:  Normal external genitalia. Yellow vaginal discharge. Cervix is normal. Positive cervical motion tenderness. No uterine or adnexal tenderness.  Musculoskeletal: She exhibits no edema.  Neurological: She is alert and oriented to person, place, and time.  Skin: Skin is warm and dry.  Psychiatric: She has a normal mood and affect. Her behavior is normal.  ED Course  Procedures (including critical care time) Labs Review Labs Reviewed  WET PREP, GENITAL - Abnormal; Notable for the following:    Clue Cells Wet Prep HPF POC MANY (*)    WBC, Wet Prep HPF POC MANY (*)    All other components within normal limits  URINALYSIS, ROUTINE W REFLEX MICROSCOPIC - Abnormal; Notable for the following:    Hgb urine  dipstick SMALL (*)    Leukocytes, UA SMALL (*)    All other components within normal limits  URINE MICROSCOPIC-ADD ON - Abnormal; Notable for the following:    Squamous Epithelial / LPF FEW (*)    All other components within normal limits  GC/CHLAMYDIA PROBE AMP  POCT PREGNANCY, URINE   Imaging Review No results found.  EKG Interpretation   None       MDM   1. Cervicitis   2. BV (bacterial vaginosis)     Patient with urinary symptoms and vaginal discharge. Exam concerning for cervicitis. Her urinalysis did not show a definite infection, culture sent. Her wet prep however did show many white blood cells and many clue cells. Given she did have cervical motion tenderness and symptoms will treat here for possible coronary and Chlamydia. She was given 250 mg of Rocephin IM and 1 g of Zithromax by mouth. I will discharge her home on Flagyl for BV. She will followup with her OB/GYN doctor. This time no signs of pelvic inflammatory disease given no abdominal pain or tenderness on exam. She's afebrile. She is nontoxic appearing  Filed Vitals:   08/13/13 1609  BP: 105/73  Pulse: 89  Temp: 97.5 F (36.4 C)  TempSrc: Oral  Resp: 16  Weight: 179 lb 11.2 oz (81.511 kg)  SpO2: 100%     Lottie Mussel, PA-C 08/13/13 1933

## 2013-08-13 NOTE — ED Notes (Signed)
Pt reports urinary frequency, hematuria, and dysuria for past 4 days.

## 2013-08-13 NOTE — ED Provider Notes (Signed)
  Medical screening examination/treatment/procedure(s) were performed by non-physician practitioner and as supervising physician I was immediately available for consultation/collaboration.  EKG Interpretation   None          Drayden Lukas, MD 08/13/13 1945 

## 2013-08-14 LAB — GC/CHLAMYDIA PROBE AMP: CT Probe RNA: NEGATIVE

## 2014-01-08 ENCOUNTER — Inpatient Hospital Stay (HOSPITAL_COMMUNITY)
Admission: AD | Admit: 2014-01-08 | Discharge: 2014-01-08 | Disposition: A | Discharge status: Home / Self Care | Payer: Medicaid Other | Source: Ambulatory Visit | Attending: Obstetrics and Gynecology | Admitting: Obstetrics and Gynecology

## 2014-01-08 ENCOUNTER — Encounter (HOSPITAL_COMMUNITY): Payer: Self-pay | Admitting: *Deleted

## 2014-01-08 DIAGNOSIS — N949 Unspecified condition associated with female genital organs and menstrual cycle: Secondary | ICD-10-CM | POA: Insufficient documentation

## 2014-01-08 DIAGNOSIS — B373 Candidiasis of vulva and vagina: Secondary | ICD-10-CM | POA: Insufficient documentation

## 2014-01-08 DIAGNOSIS — R109 Unspecified abdominal pain: Secondary | ICD-10-CM | POA: Insufficient documentation

## 2014-01-08 DIAGNOSIS — F17200 Nicotine dependence, unspecified, uncomplicated: Secondary | ICD-10-CM | POA: Insufficient documentation

## 2014-01-08 DIAGNOSIS — D573 Sickle-cell trait: Secondary | ICD-10-CM | POA: Insufficient documentation

## 2014-01-08 DIAGNOSIS — F3289 Other specified depressive episodes: Secondary | ICD-10-CM | POA: Insufficient documentation

## 2014-01-08 DIAGNOSIS — N390 Urinary tract infection, site not specified: Secondary | ICD-10-CM | POA: Insufficient documentation

## 2014-01-08 DIAGNOSIS — F329 Major depressive disorder, single episode, unspecified: Secondary | ICD-10-CM | POA: Insufficient documentation

## 2014-01-08 DIAGNOSIS — B3731 Acute candidiasis of vulva and vagina: Secondary | ICD-10-CM | POA: Insufficient documentation

## 2014-01-08 LAB — URINALYSIS, ROUTINE W REFLEX MICROSCOPIC
BILIRUBIN URINE: NEGATIVE
GLUCOSE, UA: NEGATIVE mg/dL
KETONES UR: NEGATIVE mg/dL
NITRITE: NEGATIVE
PH: 6 (ref 5.0–8.0)
Protein, ur: NEGATIVE mg/dL
Specific Gravity, Urine: 1.025 (ref 1.005–1.030)
Urobilinogen, UA: 0.2 mg/dL (ref 0.0–1.0)

## 2014-01-08 LAB — WET PREP, GENITAL
Clue Cells Wet Prep HPF POC: NONE SEEN
Trich, Wet Prep: NONE SEEN
YEAST WET PREP: NONE SEEN

## 2014-01-08 LAB — URINE MICROSCOPIC-ADD ON

## 2014-01-08 LAB — POCT PREGNANCY, URINE: Preg Test, Ur: NEGATIVE

## 2014-01-08 MED ORDER — FLUCONAZOLE 150 MG PO TABS: ORAL_TABLET | ORAL | Status: DC

## 2014-01-08 MED ORDER — FLUCONAZOLE 150 MG PO TABS
150.0000 mg | ORAL_TABLET | Freq: Once | ORAL | Status: AC
  Administered 2014-01-08: 150 mg via ORAL
  Filled 2014-01-08: qty 1

## 2014-01-08 MED ORDER — SULFAMETHOXAZOLE-TRIMETHOPRIM 800-160 MG PO TABS: 1.0000 | ORAL_TABLET | Freq: Two times a day (BID) | ORAL | Status: AC

## 2014-01-08 MED ORDER — SULFAMETHOXAZOLE-TRIMETHOPRIM 800-160 MG PO TABS: 1.0000 | ORAL_TABLET | Freq: Two times a day (BID) | ORAL | Status: DC

## 2014-01-08 NOTE — Discharge Instructions (Signed)
Take the medication as directed. Follow up with your doctor, return here as needed.

## 2014-01-08 NOTE — MAU Provider Note (Signed)
CSN: [id number]     Arrival date & time 01/08/14  1415 History   None    Chief Complaint  Patient presents with  . Vaginal Pain  . Abdominal Cramping     (Consider location/radiation/quality/duration/timing/severity/associated sxs/prior Treatment) Patient is a 23 y.o. female presenting with vaginal pain and cramps. The history is provided by the patient.  Vaginal Pain This is a new problem. The current episode started in the past 7 days. The problem occurs constantly. The problem has been gradually worsening. Associated symptoms include congestion. Pertinent negatives include no anorexia, chills, coughing, fever, headaches, myalgias, nausea, rash, swollen glands, urinary symptoms, vomiting or weakness. Abdominal pain: cramping. Nothing aggravates the symptoms. Treatments tried: OTC meds. The treatment provided no relief.  Abdominal Cramping The primary symptoms of the illness include vaginal discharge. The primary symptoms of the illness do not include fever, nausea, vomiting or dysuria. Abdominal pain: cramping.  The vaginal discharge is not associated with dysuria.   Symptoms associated with the illness do not include chills, anorexia, urgency, frequency or back pain.  Linda Barry is a 23 y.o. female who presents to the ED with vaginal pain and some discharge that started 3 days ago. She used OTC yeast cream but it just burns. Also complains of low abdominal cramping that has been off and on. LMP unsure because she has implant for birth control. G2P2. Hx of trichomonas  And Chlamydia 2 years ago. Current sex partner x 2 years.   Past Medical History  Diagnosis Date  . Depression   . Sickle cell trait   . Normal pregnancy 04/15/2012  . SVD (spontaneous vaginal delivery) 04/15/2012  . Placenta abruption, delivered, current hospitalization 04/15/2012   History reviewed. No pertinent past surgical history. Family History  Problem Relation Age of Onset  . Other Neg Hx    History   Substance Use Topics  . Smoking status: Current Every Day Smoker -- 0.25 packs/day    Types: Cigarettes    Last Attempt to Quit: 02/01/2012  . Smokeless tobacco: Never Used  . Alcohol Use: No   OB History   Grav Para Term Preterm Abortions TAB SAB Ect Mult Living   2 2 2       2      Review of Systems  Constitutional: Negative for fever and chills.  HENT: Positive for congestion.   Eyes: Negative for visual disturbance.  Respiratory: Negative for cough.   Gastrointestinal: Negative for nausea, vomiting and anorexia. Abdominal pain: cramping.  Genitourinary: Positive for vaginal discharge and vaginal pain. Negative for dysuria, urgency, frequency and decreased urine volume.  Musculoskeletal: Negative for back pain and myalgias.  Skin: Negative for rash.  Neurological: Negative for weakness, light-headedness and headaches.  Psychiatric/Behavioral: Negative for confusion.      Allergies  Review of patient's allergies indicates no known allergies.  Home Medications   Prior to Admission medications   Medication Sig Start Date End Date Taking? Authorizing Provider  etonogestrel (IMPLANON) 68 MG IMPL implant Inject 1 each into the skin once.    Historical Provider, MD  metroNIDAZOLE (FLAGYL) 500 MG tablet Take 1 tablet (500 mg total) by mouth 2 (two) times daily. 08/13/13   Tatyana A Kirichenko, PA-C   BP 108/76  Pulse 79  Temp(Src) 98.7 F (37.1 C) (Oral)  Resp 16  Ht 5\' 5"  (1.651 m)  Wt 192 lb 12.8 oz (87.454 kg)  BMI 32.08 kg/m2  SpO2 99% Physical Exam  Nursing note and vitals reviewed. Constitutional: She is  oriented to person, place, and time. She appears well-developed and well-nourished.  HENT:  Head: Normocephalic and atraumatic.  Eyes: EOM are normal.  Neck: Neck supple.  Cardiovascular: Normal rate.   Pulmonary/Chest: Effort normal.  Abdominal: Soft. There is no tenderness.  Genitourinary:  External genitalia without lesions. Thick cottage cheese like  discharge vaginal vault, copious amount. Cervix inflamed, no CMT, no adnexal tenderness, uterus without palpable enlargement.   Musculoskeletal: Normal range of motion.  Neurological: She is alert and oriented to person, place, and time. No cranial nerve deficit.  Skin: Skin is warm and dry.  Psychiatric: She has a normal mood and affect. Her behavior is normal.    ED Course  Procedures (including critical care time) Results for orders placed during the hospital encounter of 01/08/14 (from the past 24 hour(s))  URINALYSIS, ROUTINE W REFLEX MICROSCOPIC     Status: Abnormal   Collection Time    01/08/14  1:55 PM      Result Value Ref Range   Color, Urine YELLOW  YELLOW   APPearance CLEAR  CLEAR   Specific Gravity, Urine 1.025  1.005 - 1.030   pH 6.0  5.0 - 8.0   Glucose, UA NEGATIVE  NEGATIVE mg/dL   Hgb urine dipstick TRACE (*) NEGATIVE   Bilirubin Urine NEGATIVE  NEGATIVE   Ketones, ur NEGATIVE  NEGATIVE mg/dL   Protein, ur NEGATIVE  NEGATIVE mg/dL   Urobilinogen, UA 0.2  0.0 - 1.0 mg/dL   Nitrite NEGATIVE  NEGATIVE   Leukocytes, UA MODERATE (*) NEGATIVE  URINE MICROSCOPIC-ADD ON     Status: Abnormal   Collection Time    01/08/14  1:55 PM      Result Value Ref Range   Squamous Epithelial / LPF FEW (*) RARE   WBC, UA 11-20  <3 WBC/hpf   RBC / HPF 7-10  <3 RBC/hpf   Bacteria, UA MANY (*) RARE   Urine-Other MUCOUS PRESENT    POCT PREGNANCY, URINE     Status: None   Collection Time    01/08/14  3:10 PM      Result Value Ref Range   Preg Test, Ur NEGATIVE  NEGATIVE     MDM  23 y.o. female with vaginal discharge and abdominal cramping for 3 days. No relief with OTC medication for yeast. Although no yeast was reported on wet prep I will treat for yeast infection based on clinical finding and patient symptoms. Will also treat for UTI. Stable for discharge with normal vs. BP 117/75  Pulse 78  Temp(Src) 98.7 F (37.1 C) (Oral)  Resp 16  Ht 5\' 5"  (1.651 m)  Wt 192 lb 12.8 oz  (87.454 kg)  BMI 32.08 kg/m2  SpO2 99%  And negative pregnancy. She will return for any problems.

## 2014-01-08 NOTE — MAU Note (Signed)
Patient states she has had vaginal irritation since 4-25 and now is painful. States she is having mild abdominal cramping. Patient states she has had a Nexplanon for about 1 year and does not have periods.

## 2014-01-09 LAB — GC/CHLAMYDIA PROBE AMP
CT Probe RNA: NEGATIVE
GC Probe RNA: NEGATIVE

## 2014-07-16 ENCOUNTER — Encounter (HOSPITAL_COMMUNITY): Payer: Self-pay | Admitting: *Deleted

## 2015-11-18 ENCOUNTER — Emergency Department (HOSPITAL_COMMUNITY)
Admission: EM | Admit: 2015-11-18 | Discharge: 2015-11-18 | Disposition: A | Discharge status: Home / Self Care | Payer: Self-pay | Source: Home / Self Care

## 2016-02-15 ENCOUNTER — Encounter (HOSPITAL_COMMUNITY): Payer: Self-pay | Admitting: Emergency Medicine

## 2016-02-15 ENCOUNTER — Emergency Department (HOSPITAL_COMMUNITY): Payer: Self-pay

## 2016-02-15 ENCOUNTER — Emergency Department (HOSPITAL_COMMUNITY)
Admission: EM | Admit: 2016-02-15 | Discharge: 2016-02-15 | Disposition: A | Discharge status: Home / Self Care | Payer: Self-pay | Attending: Emergency Medicine | Admitting: Emergency Medicine

## 2016-02-15 DIAGNOSIS — Y999 Unspecified external cause status: Secondary | ICD-10-CM | POA: Insufficient documentation

## 2016-02-15 DIAGNOSIS — Y92009 Unspecified place in unspecified non-institutional (private) residence as the place of occurrence of the external cause: Secondary | ICD-10-CM | POA: Insufficient documentation

## 2016-02-15 DIAGNOSIS — Y939 Activity, unspecified: Secondary | ICD-10-CM | POA: Insufficient documentation

## 2016-02-15 DIAGNOSIS — S82899A Other fracture of unspecified lower leg, initial encounter for closed fracture: Secondary | ICD-10-CM

## 2016-02-15 DIAGNOSIS — S82852A Displaced trimalleolar fracture of left lower leg, initial encounter for closed fracture: Secondary | ICD-10-CM | POA: Insufficient documentation

## 2016-02-15 DIAGNOSIS — S9306XA Dislocation of unspecified ankle joint, initial encounter: Secondary | ICD-10-CM

## 2016-02-15 DIAGNOSIS — S82892A Other fracture of left lower leg, initial encounter for closed fracture: Secondary | ICD-10-CM

## 2016-02-15 DIAGNOSIS — X509XXA Other and unspecified overexertion or strenuous movements or postures, initial encounter: Secondary | ICD-10-CM | POA: Insufficient documentation

## 2016-02-15 DIAGNOSIS — F17210 Nicotine dependence, cigarettes, uncomplicated: Secondary | ICD-10-CM | POA: Insufficient documentation

## 2016-02-15 MED ORDER — OXYCODONE HCL 5 MG PO TABS: 5.0000 mg | ORAL_TABLET | ORAL | Status: DC | PRN

## 2016-02-15 MED ORDER — ONDANSETRON HCL 4 MG/2ML IJ SOLN
4.0000 mg | Freq: Once | INTRAMUSCULAR | Status: AC
  Administered 2016-02-15: 4 mg via INTRAVENOUS
  Filled 2016-02-15: qty 2

## 2016-02-15 MED ORDER — ONDANSETRON 4 MG PO TBDP: 4.0000 mg | ORAL_TABLET | Freq: Three times a day (TID) | ORAL | Status: DC | PRN

## 2016-02-15 MED ORDER — OXYCODONE HCL 5 MG PO TABS
5.0000 mg | ORAL_TABLET | Freq: Once | ORAL | Status: AC
  Administered 2016-02-15: 5 mg via ORAL
  Filled 2016-02-15: qty 1

## 2016-02-15 MED ORDER — LIDOCAINE HCL 2 % IJ SOLN
10.0000 mL | Freq: Once | INTRAMUSCULAR | Status: AC
  Administered 2016-02-15: 200 mg
  Filled 2016-02-15: qty 20

## 2016-02-15 MED ORDER — KETOROLAC TROMETHAMINE 30 MG/ML IJ SOLN
30.0000 mg | Freq: Once | INTRAMUSCULAR | Status: AC
  Administered 2016-02-15: 30 mg via INTRAVENOUS
  Filled 2016-02-15: qty 1

## 2016-02-15 MED ORDER — HYDROMORPHONE HCL 1 MG/ML IJ SOLN
1.0000 mg | Freq: Once | INTRAMUSCULAR | Status: AC
  Administered 2016-02-15: 1 mg via INTRAVENOUS
  Filled 2016-02-15: qty 1

## 2016-02-15 MED ORDER — ACETAMINOPHEN 500 MG PO TABS
1000.0000 mg | ORAL_TABLET | Freq: Once | ORAL | Status: AC
  Administered 2016-02-15: 1000 mg via ORAL
  Filled 2016-02-15: qty 2

## 2016-02-15 MED ORDER — FENTANYL CITRATE (PF) 100 MCG/2ML IJ SOLN
100.0000 ug | Freq: Once | INTRAMUSCULAR | Status: AC
  Administered 2016-02-15: 100 ug via INTRAVENOUS
  Filled 2016-02-15: qty 2

## 2016-02-15 NOTE — ED Notes (Signed)
The patient was coming down the stairs at home, stepped wrong and she felt her ankle "bend back".  EMS advised she does have an ankle deformity.  EMS gave her 100mcg of Fentanyl.  She rates her pain 10/10.

## 2016-02-15 NOTE — ED Notes (Signed)
Patient transported to X-ray 

## 2016-02-15 NOTE — ED Notes (Signed)
Ortho tech at the bedside.  

## 2016-02-15 NOTE — ED Notes (Signed)
Patient is alert and orientedx4.  Patient was explained discharge instructions and they understood them with no questions.  The patient's mother in law, Cala BradfordKimberly price is taking her home.

## 2016-02-15 NOTE — ED Notes (Signed)
MD at bedside, DR. Swintzc.

## 2016-02-15 NOTE — Discharge Instructions (Signed)
Follow up with your ortho.   Take 4 over the counter ibuprofen tablets 3 times a day or 2 over-the-counter naproxen tablets twice a day for pain. Tylenol 1-2 tabs po q4h prn  Tibial Fracture, Adult A tibial fracture is a break in your tibia bone. The tibia is the large shin bone in your lower leg. The bone will be held in place with a cast or splint until it is healed. HOME CARE  If you have a cast:  Do not scratch under the cast.  Check the skin around the cast every day. You may put lotion on any red or sore areas.  Keep your cast dry and clean.  If you have a splint:  Wear the splint as told by your doctor.  Loosen the elastic around the splint if your toes get numb, tingle, or turn cold or blue.  Do not put pressure on the cast or splint until it is hard.  Do not put the cast or splint in water. Cover it with a plastic bag when bathing.  Use crutches as told by your doctor.  Take medicines only as told by your doctor.  Keep all follow-up visits as told by your doctor. This is important. GET HELP IF:  Your pain gets worse or is not controlled with medicine.  You have increased puffiness (swelling) or redness in your foot.  You start to lose feeling in your foot or toes. GET HELP RIGHT AWAY IF:  Your foot or toes get cold or turn blue.  You have bad pain in your leg, especially if it gets worse when you move your toes. MAKE SURE YOU:  Understand these instructions.  Will watch your condition.  Will get help right away if you are not doing well or get worse.   This information is not intended to replace advice given to you by your health care provider. Make sure you discuss any questions you have with your health care provider.   Document Released: 10/03/2010 Document Revised: 01/15/2015 Document Reviewed: 10/25/2013 Elsevier Interactive Patient Education [employer]2016 Elsevier Inc.

## 2016-02-15 NOTE — Progress Notes (Signed)
Orthopedic Tech Progress Note Patient Details:  Linda Barry [birth year] [id number]  Ortho Devices Type of Ortho Device: Ace wrap, Post (short leg) splint, Stirrup splint, Crutches Ortho Device/Splint Interventions: Application   Saul FordyceJennifer C Jackalyn Haith 02/15/2016, 12:34 PM

## 2016-02-15 NOTE — ED Notes (Signed)
Family at bedside. 

## 2016-02-15 NOTE — ED Notes (Signed)
MD at bedside. 

## 2016-02-15 NOTE — ED Notes (Signed)
MD at bedside, Dr. Adela LankFloyd at the bedside.

## 2016-02-15 NOTE — Consult Note (Signed)
ORTHOPAEDIC CONSULTATION  REQUESTING PHYSICIAN: Melene Planan Floyd, DO  PCP:  No PCP Per Patient  Chief Complaint: Left trimalleolar ankle fracture  HPI: Linda Barry is a 25 y.o. female who complains of left ankle injury. She was on her way out the door to work this morning, when she missed 2 steps and injured her left ankle. She had pain, swelling, and inability to weight-bear. [facility]rays revealed a trimalleolar left ankle fracture with lateral subluxation of the talus. Orthopedic consultation was placed for management of her left ankle. She does smoke 5 cigarettes per day. She denies other injuries.  Past Medical History  Diagnosis Date  . Depression   . Sickle cell trait (HCC)   . Normal pregnancy 04/15/2012  . SVD (spontaneous vaginal delivery) 04/15/2012  . Placenta abruption, delivered, current hospitalization 04/15/2012   History reviewed. No pertinent past surgical history. Social History   Social History  . Marital Status: Single    Spouse Name: N/A  . Number of Children: N/A  . Years of Education: N/A   Social History Main Topics  . Smoking status: Current Every Day Smoker -- 0.25 packs/day    Types: Cigarettes    Last Attempt to Quit: 02/01/2012  . Smokeless tobacco: Never Used  . Alcohol Use: No  . Drug Use: No  . Sexual Activity: Yes    Birth Control/ Protection: Implant   Other Topics Concern  . None   Social History Narrative   Family History  Problem Relation Age of Onset  . Other Neg Hx    No Known Allergies Prior to Admission medications   Medication Sig Start Date End Date Taking? Authorizing Provider  etonogestrel (IMPLANON) 68 MG IMPL implant Inject 1 each into the skin once.    Historical Provider, MD   Dg Knee 1-2 Views Left  02/15/2016  CLINICAL DATA:  Pain and swelling in left ankle. Deformity at lateral malleolus fall off porch this morning. EXAM: LEFT KNEE - 1-2 VIEW COMPARISON:  None.  FINDINGS: No evidence of fracture, dislocation, or joint effusion. No evidence of arthropathy or other focal bone abnormality. Soft tissues are unremarkable. IMPRESSION: Negative. Electronically Signed   By: Charlett NoseKevin  Dover M.D.   On: 02/15/2016 10:06   Dg Ankle Complete Left  02/15/2016  CLINICAL DATA:  Fall off porch this morning.  Ankle deformity. EXAM: LEFT ANKLE COMPLETE - 3+ VIEW COMPARISON:  03/20/2012 FINDINGS: There are fractures through the distal fibula, medial malleolus, and posterior tibia compatible with trimalleolar fracture. Widening of the ankle mortise medially. Diffuse soft tissue swelling. IMPRESSION: Trimalleolar fracture with disruption of the ankle mortise. Electronically Signed   By: Charlett NoseKevin  Dover M.D.   On: 02/15/2016 10:06    Positive ROS: All other systems have been reviewed and were otherwise negative with the exception of those mentioned in the HPI and as above.  Physical Exam: General: Alert, no acute distress Cardiovascular: No pedal edema Respiratory: No cyanosis, no use of accessory musculature GI: No organomegaly, abdomen is soft and non-tender Skin: No lesions in the area of chief complaint Neurologic: Sensation intact distally Psychiatric: Patient is competent for consent with normal mood and affect Lymphatic: No axillary or cervical lymphadenopathy  MUSCULOSKELETAL: Examination of the left lower extremity reveals that her ankle is very swollen. There are no skin wrinkles present. There are no skin wounds or lesions. She has tenderness to palpation of the medial and lateral malleoli. There is no tenderness to  palpation of the base of the fifth metatarsal or fibular head. She reports altered sensation to the superficial and deep peroneal distributions. She reports intact sensation to light touch in the sural, saphenous, and posterior tibial distributions. She has a 2+ DP pulse.  Assessment: Left trimalleolar ankle fracture with lateral subluxation of the  talus  Plan: I discussed the findings with the patient. I recommended closed reduction and splinting of the left ankle. She will require surgical fixation of her left ankle fracture once the swelling subsides. IV pain medication was administered by the emergency department staff, and then I performed a closed reduction of the left ankle and applied a well-padded well-molded short leg posterior splint with a U. Postreduction films showed improvement in alignment. She will need to be nonweightbearing to the left lower extremity. We discussed strict elevation of the left ankle to the level of the heart on blankets and pillows. She will call the office for an appointment this week for surgical planning. She understands and all of her questions were answered.    Bevely Hackbart, Cloyde Reams, MD Cell [phone]    02/15/2016 11:30 AM

## 2016-02-15 NOTE — ED Provider Notes (Signed)
CSN: [id number]     Arrival date & time 02/15/16  0920 History   First MD Initiated Contact with Patient 02/15/16 0935     Chief Complaint  Patient presents with  . Ankle Injury    The patient was coming down the stairs at home, stepped wrong and she felt her ankle "bend back".  EMS advised she does have an ankle deformity.  EMS gave her of Fentanyl.  She rates her pain 10/10.     (Consider location/radiation/quality/duration/timing/severity/associated sxs/prior Treatment) Patient is a 25 y.o. female presenting with lower extremity injury. The history is provided by the patient.  Ankle Injury This is a new problem. The current episode started less than 1 hour ago. The problem occurs constantly. The problem has not changed since onset.Pertinent negatives include no chest pain, no abdominal pain, no headaches and no shortness of breath. The symptoms are aggravated by walking, bending, twisting and standing. Nothing relieves the symptoms. She has tried nothing for the symptoms. The treatment provided no relief.   25 yo F With a chief complaint of left ankle pain. Patient stepped off of a step in twisted her ankle. Noted that she had deformity and was unable to walk afterwards. Call 911 and was taken to the emergency department. Denies prior injury. Denies head injury loss of consciousness chest pain back pain neck pain.   Past Medical History  Diagnosis Date  . Depression   . Sickle cell trait (HCC)   . Normal pregnancy 04/15/2012  . SVD (spontaneous vaginal delivery) 04/15/2012  . Placenta abruption, delivered, current hospitalization 04/15/2012   History reviewed. No pertinent past surgical history. Family History  Problem Relation Age of Onset  . Other Neg Hx    Social History  Substance Use Topics  . Smoking status: Current Every Day Smoker -- 0.25 packs/day    Types: Cigarettes    Last Attempt to Quit: 02/01/2012  . Smokeless tobacco: Never Used  . Alcohol Use: No   OB  History    Gravida Para Term Preterm AB TAB SAB Ectopic Multiple Living   Review of Systems  Constitutional: Negative for fever and chills.  HENT: Negative for congestion and rhinorrhea.   Eyes: Negative for redness and visual disturbance.  Respiratory: Negative for shortness of breath and wheezing.   Cardiovascular: Negative for chest pain and palpitations.  Gastrointestinal: Negative for nausea, vomiting and abdominal pain.  Genitourinary: Negative for dysuria and urgency.  Musculoskeletal: Positive for myalgias, arthralgias and gait problem.  Skin: Negative for pallor and wound.  Neurological: Negative for dizziness and headaches.      Allergies  Review of patient's allergies indicates no known allergies.  Home Medications   Prior to Admission medications   Medication Sig Start Date End Date Taking? Authorizing Provider  etonogestrel (IMPLANON) 68 MG IMPL implant Inject 1 each into the skin once.    Historical Provider, MD  ondansetron (ZOFRAN ODT) 4 MG disintegrating tablet Take 1 tablet (4 mg total) by mouth every 8 (eight) hours as needed for nausea or vomiting. 02/15/16   Melene Plan, DO  oxyCODONE (ROXICODONE) 5 MG immediate release tablet Take 1 tablet (5 mg total) by mouth every 4 (four) hours as needed for severe pain. 02/15/16   Melene Plan, DO   BP 121/93 mmHg  Pulse 58  Temp(Src) 98.2 F (36.8 C) (Oral)  Resp 20  SpO2 98%  LMP 02/13/2016 Physical Exam  Constitutional: She is oriented to person, place, and time. She appears well-developed and well-nourished. No distress.  HENT:  Head: Normocephalic and atraumatic.  Eyes: EOM are normal. Pupils are equal, round, and reactive to light.  Neck: Normal range of motion. Neck supple.  Cardiovascular: Normal rate and regular rhythm.  Exam reveals no gallop and no friction rub.   No murmur heard. Pulmonary/Chest: Effort normal. She has no wheezes. She has no rales.  Abdominal: Soft. She exhibits no  distension. There is no tenderness.  Musculoskeletal: She exhibits edema and tenderness.  Significant tenderness to the left ankle. Pulse motor and sensation is intact distally. Palpable medial malleolus with a gap suspect lateral displacement of the ankle. Tender palpation of the fibular head.  Neurological: She is alert and oriented to person, place, and time.  Skin: Skin is warm and dry. She is not diaphoretic.  Psychiatric: She has a normal mood and affect. Her behavior is normal.  Nursing note and vitals reviewed.   ED Course  Injection of joint Date/Time: 02/15/2016 2:05 PM Performed by: Adela LankFLOYD, Arshi Duarte Authorized by: Melene PlanFLOYD, Roberto Romanoski Consent: Verbal consent obtained. Risks and benefits: risks, benefits and alternatives were discussed Consent given by: patient Required items: required blood products, implants, devices, and special equipment available Patient identity confirmed: verbally with patient Time out: Immediately prior to procedure a "time out" was called to verify the correct patient, procedure, equipment, support staff and site/side marked as required. Preparation: Patient was prepped and draped in the usual sterile fashion. Local anesthesia used: no Patient sedated: no Patient tolerance: Patient tolerated the procedure well with no immediate complications Comments: 8cc of 1% lidocaine place intraarticularly into the ankle.    (including critical care time) Labs Review Labs Reviewed - No data to display  Imaging Review Dg Knee 1-2 Views Left  02/15/2016  CLINICAL DATA:  Pain and swelling in left ankle. Deformity at lateral malleolus fall off porch this morning. EXAM: LEFT KNEE - 1-2 VIEW COMPARISON:  None. FINDINGS: No evidence of fracture, dislocation, or joint effusion. No evidence of arthropathy or other focal bone abnormality. Soft tissues are unremarkable. IMPRESSION: Negative. Electronically Signed   By: Charlett NoseKevin  Dover M.D.   On: 02/15/2016 10:06   Dg Ankle Complete  Left  02/15/2016  CLINICAL DATA:  Fall off porch this morning.  Ankle deformity. EXAM: LEFT ANKLE COMPLETE - 3+ VIEW COMPARISON:  03/20/2012 FINDINGS: There are fractures through the distal fibula, medial malleolus, and posterior tibia compatible with trimalleolar fracture. Widening of the ankle mortise medially. Diffuse soft tissue swelling. IMPRESSION: Trimalleolar fracture with disruption of the ankle mortise. Electronically Signed   By: Charlett NoseKevin  Dover M.D.   On: 02/15/2016 10:06   Dg Ankle Left Port  02/15/2016  CLINICAL DATA:  Left ankle fracture-dislocation. Status postreduction. Initial encounter. EXAM: PORTABLE LEFT ANKLE - 2 VIEW COMPARISON:  Prior today FINDINGS: Trimalleolar ankle fracture is now in near anatomic alignment. The talus is now centered within the ankle mortise. A cast has been applied. IMPRESSION: Successful reduction of trimalleolar ankle fracture, now in near anatomic alignment. Electronically Signed   By: Myles RosenthalJohn  Stahl M.D.   On: 02/15/2016 11:53   I have personally reviewed and evaluated these images and lab results as part of my medical decision-making.   EKG Interpretation None      MDM   Final diagnoses:  Closed left ankle fracture, initial encounter    25 yo F with left ankle pain. Clinically fractured on physical exam. Will obtain plain films.  Treat pain.  Patient found to have a trimalleolar fracture. Discussed with Dr. Linna Caprice, he has been able to come by and see the patient. Reduced the fracture at bedside. She will follow-up in his office in the next week to schedule surgical repair.  2:07 PM:  I have discussed the diagnosis/risks/treatment options with the patient and believe the pt to be eligible for discharge home to follow-up with Ortho. We also discussed returning to the ED immediately if new or worsening sx occur. We discussed the sx which are most concerning (e.g., sudden worsening pain, fever, inability to tolerate by mouth) that necessitate immediate  return. Medications administered to the patient during their visit and any new prescriptions provided to the patient are listed below.  Medications given during this visit Medications  HYDROmorphone (DILAUDID) injection 1 mg (1 mg Intravenous Given 02/15/16 0933)  ondansetron (ZOFRAN) injection 4 mg (4 mg Intravenous Given 02/15/16 0933)  fentaNYL (SUBLIMAZE) injection 100 mcg (100 mcg Intravenous Given 02/15/16 1100)  lidocaine (XYLOCAINE) 2 % (with pres) injection 200 mg (200 mg Other Given 02/15/16 1101)  fentaNYL (SUBLIMAZE) injection 100 mcg (100 mcg Intravenous Given 02/15/16 1109)  ondansetron (ZOFRAN) injection 4 mg (4 mg Intravenous Given 02/15/16 1122)  ondansetron (ZOFRAN) injection 4 mg (4 mg Intravenous Given 02/15/16 1213)  ketorolac (TORADOL) 30 MG/ML injection 30 mg (30 mg Intravenous Given 02/15/16 1345)  oxyCODONE (Oxy IR/ROXICODONE) immediate release tablet 5 mg (5 mg Oral Given 02/15/16 1344)  acetaminophen (TYLENOL) tablet 1,000 mg (1,000 mg Oral Given 02/15/16 1344)    Discharge Medication List as of 02/15/2016  1:29 PM    START taking these medications   Details  ondansetron (ZOFRAN ODT) 4 MG disintegrating tablet Take 1 tablet (4 mg total) by mouth every 8 (eight) hours as needed for nausea or vomiting., Starting 02/15/2016, Until Discontinued, Print    oxyCODONE (ROXICODONE) 5 MG immediate release tablet Take 1 tablet (5 mg total) by mouth every 4 (four) hours as needed for severe pain., Starting 02/15/2016, Until Discontinued, Print        The patient appears reasonably screen and/or stabilized for discharge and I doubt any other medical condition or other [facility] requiring further screening, evaluation, or treatment in the ED at this time prior to discharge.    Melene Plan, DO 02/15/16 1407

## 2016-02-24 ENCOUNTER — Ambulatory Visit: Payer: Self-pay | Admitting: Orthopedic Surgery

## 2016-02-25 ENCOUNTER — Encounter (HOSPITAL_COMMUNITY): Payer: Self-pay

## 2016-02-25 ENCOUNTER — Encounter (HOSPITAL_COMMUNITY)
Admission: RE | Admit: 2016-02-25 | Discharge: 2016-02-25 | Disposition: A | Discharge status: Home / Self Care | Payer: Medicaid Other | Source: Ambulatory Visit | Attending: Orthopedic Surgery | Admitting: Orthopedic Surgery

## 2016-02-25 DIAGNOSIS — X58XXXA Exposure to other specified factors, initial encounter: Secondary | ICD-10-CM | POA: Insufficient documentation

## 2016-02-25 DIAGNOSIS — S82892A Other fracture of left lower leg, initial encounter for closed fracture: Secondary | ICD-10-CM | POA: Insufficient documentation

## 2016-02-25 DIAGNOSIS — Z01812 Encounter for preprocedural laboratory examination: Secondary | ICD-10-CM | POA: Insufficient documentation

## 2016-02-25 LAB — CBC
HEMATOCRIT: 33.9 % — AB (ref 36.0–46.0)
HEMOGLOBIN: 11.2 g/dL — AB (ref 12.0–15.0)
MCH: 25.2 pg — AB (ref 26.0–34.0)
MCHC: 33 g/dL (ref 30.0–36.0)
MCV: 76.2 fL — AB (ref 78.0–100.0)
Platelets: 267 10*3/uL (ref 150–400)
RBC: 4.45 MIL/uL (ref 3.87–5.11)
RDW: 13.2 % (ref 11.5–15.5)
WBC: 7.5 10*3/uL (ref 4.0–10.5)

## 2016-02-25 LAB — HCG, SERUM, QUALITATIVE: Preg, Serum: NEGATIVE

## 2016-02-25 NOTE — Pre-Procedure Instructions (Signed)
Linda Barry  02/25/2016      [facility] [address] [address], [place] - [address] AT [facility] [address] [place] [address] Phone: [phone] Fax: [phone]    Your procedure is scheduled on 02/28/16.  Report to [facility] Admitting at 1200 A.M.  Call this number if you have problems the morning of surgery:  [phone]   Remember:  Do not eat food or drink liquids after midnight.  Take these medicines the morning of surgery with A SIP OF WATER oxycodone if needed  STOP all herbel meds, nsaids (aleve,naproxen,advil,ibuprofen) 5 days prior to surgery including vitamins,aspirin   Do not wear jewelry, make-up or nail polish.  Do not wear lotions, powders, or perfumes.  You may wear deodorant.  Do not shave 48 hours prior to surgery.  Men may shave face and neck.  Do not bring valuables to the hospital.  [facility] is not responsible for any belongings or valuables.  Contacts, dentures or bridgework may not be worn into surgery.  Leave your suitcase in the car.  After surgery it may be brought to your room.  For patients admitted to the hospital, discharge time will be determined by your treatment team.  Patients discharged the day of surgery will not be allowed to drive home.   Name and phone number of your driver:    Special instructions:  Special Instructions: [place] - Preparing for Surgery  Before surgery, you can play an important role.  Because skin is not sterile, your skin needs to be as free of germs as possible.  You can reduce the number of germs on you skin by washing with CHG (chlorahexidine gluconate) soap before surgery.  CHG is an antiseptic cleaner which kills germs and bonds with the skin to continue killing germs even after washing.  Please DO NOT use if you have an allergy to CHG or antibacterial soaps.  If your skin becomes reddened/irritated stop using the CHG and inform your nurse when  you arrive at Short Stay.  Do not shave (including legs and underarms) for at least 48 hours prior to the first CHG shower.  You may shave your face.  Please follow these instructions carefully:   1.  Shower with CHG Soap the night before surgery and the morning of Surgery.  2.  If you choose to wash your hair, wash your hair first as usual with your normal shampoo.  3.  After you shampoo, rinse your hair and body thoroughly to remove the Shampoo.  4.  Use CHG as you would any other liquid soap.  You can apply chg directly  to the skin and wash gently with scrungie or a clean washcloth.  5.  Apply the CHG Soap to your body ONLY FROM THE NECK DOWN.  Do not use on open wounds or open sores.  Avoid contact with your eyes ears, mouth and genitals (private parts).  Wash genitals (private parts)       with your normal soap.  6.  Wash thoroughly, paying special attention to the area where your surgery will be performed.  7.  Thoroughly rinse your body with warm water from the neck down.  8.  DO NOT shower/wash with your normal soap after using and rinsing off the CHG Soap.  9.  Pat yourself dry with a clean towel.            10.  Wear clean pajamas.            11.  Place clean sheets on your bed the night of your first shower and do not sleep with pets.  Day of Surgery  Do not apply any lotions/deodorants the morning of surgery.  Please wear clean clothes to the hospital/surgery center.  Please read over the following fact sheets that you were given. Pain Booklet and Surgical Site Infection Prevention

## 2016-02-27 MED ORDER — CEFAZOLIN SODIUM-DEXTROSE 2-4 GM/100ML-% IV SOLN
2.0000 g | INTRAVENOUS | Status: AC
  Administered 2016-02-28: 2 g via INTRAVENOUS
  Filled 2016-02-27: qty 100

## 2016-02-28 ENCOUNTER — Ambulatory Visit (HOSPITAL_COMMUNITY): Payer: Self-pay | Admitting: Anesthesiology

## 2016-02-28 ENCOUNTER — Ambulatory Visit (HOSPITAL_BASED_OUTPATIENT_CLINIC_OR_DEPARTMENT_OTHER)
Admission: RE | Admit: 2016-02-28 | Discharge: 2016-02-28 | Disposition: A | Discharge status: Home / Self Care | Payer: Self-pay | Source: Ambulatory Visit | Attending: Orthopedic Surgery | Admitting: Orthopedic Surgery

## 2016-02-28 ENCOUNTER — Encounter (HOSPITAL_COMMUNITY)
Admission: RE | Disposition: A | Discharge status: Home / Self Care | Payer: Self-pay | Source: Ambulatory Visit | Attending: Orthopedic Surgery

## 2016-02-28 ENCOUNTER — Ambulatory Visit (HOSPITAL_COMMUNITY): Payer: Self-pay

## 2016-02-28 ENCOUNTER — Encounter (HOSPITAL_COMMUNITY): Payer: Self-pay | Admitting: Anesthesiology

## 2016-02-28 DIAGNOSIS — Z419 Encounter for procedure for purposes other than remedying health state, unspecified: Secondary | ICD-10-CM

## 2016-02-28 DIAGNOSIS — S82852A Displaced trimalleolar fracture of left lower leg, initial encounter for closed fracture: Secondary | ICD-10-CM | POA: Insufficient documentation

## 2016-02-28 HISTORY — PX: ORIF ANKLE FRACTURE: SHX5408

## 2016-02-28 SURGERY — OPEN REDUCTION INTERNAL FIXATION (ORIF) ANKLE FRACTURE
Anesthesia: General | Site: Ankle | Laterality: Left

## 2016-02-28 MED ORDER — METOCLOPRAMIDE HCL 5 MG PO TABS: 5.0000 mg | ORAL_TABLET | Freq: Three times a day (TID) | ORAL | Status: DC | PRN

## 2016-02-28 MED ORDER — ONDANSETRON HCL 4 MG PO TABS: 4.0000 mg | ORAL_TABLET | Freq: Three times a day (TID) | ORAL | Status: DC | PRN

## 2016-02-28 MED ORDER — SENNA 8.6 MG PO TABS: 2.0000 | ORAL_TABLET | Freq: Every day | ORAL | Status: DC

## 2016-02-28 MED ORDER — MIDAZOLAM HCL 2 MG/2ML IJ SOLN
INTRAMUSCULAR | Status: AC
  Filled 2016-02-28: qty 2

## 2016-02-28 MED ORDER — PHENYLEPHRINE HCL 10 MG/ML IJ SOLN
INTRAMUSCULAR | Status: DC | PRN
  Administered 2016-02-28: 40 ug via INTRAVENOUS
  Administered 2016-02-28: 80 ug via INTRAVENOUS
  Administered 2016-02-28: 40 ug via INTRAVENOUS
  Administered 2016-02-28: 80 ug via INTRAVENOUS
  Administered 2016-02-28 (×4): 40 ug via INTRAVENOUS

## 2016-02-28 MED ORDER — BUPIVACAINE-EPINEPHRINE (PF) 0.25% -1:200000 IJ SOLN
INTRAMUSCULAR | Status: AC
  Filled 2016-02-28: qty 30

## 2016-02-28 MED ORDER — ALCOHOL (RUBBING) 70 % SOLN
Status: DC | PRN
  Administered 2016-02-28: 30 mL via TOPICAL

## 2016-02-28 MED ORDER — FENTANYL CITRATE (PF) 250 MCG/5ML IJ SOLN
INTRAMUSCULAR | Status: AC
  Filled 2016-02-28: qty 5

## 2016-02-28 MED ORDER — METOCLOPRAMIDE HCL 5 MG/ML IJ SOLN: 5.0000 mg | Freq: Three times a day (TID) | INTRAMUSCULAR | Status: DC | PRN

## 2016-02-28 MED ORDER — LIDOCAINE HCL (CARDIAC) 20 MG/ML IV SOLN
INTRAVENOUS | Status: DC | PRN
  Administered 2016-02-28: 100 mg via INTRAVENOUS

## 2016-02-28 MED ORDER — HYDROCODONE-ACETAMINOPHEN 7.5-325 MG PO TABS: 1.0000 | ORAL_TABLET | ORAL | Status: DC | PRN

## 2016-02-28 MED ORDER — 0.9 % SODIUM CHLORIDE (POUR BTL) OPTIME
TOPICAL | Status: DC | PRN
  Administered 2016-02-28: 1000 mL

## 2016-02-28 MED ORDER — MIDAZOLAM HCL 2 MG/2ML IJ SOLN
2.0000 mg | Freq: Once | INTRAMUSCULAR | Status: AC
  Administered 2016-02-28: 2 mg via INTRAVENOUS

## 2016-02-28 MED ORDER — PROPOFOL 10 MG/ML IV BOLUS
INTRAVENOUS | Status: DC | PRN
  Administered 2016-02-28: 200 mg via INTRAVENOUS

## 2016-02-28 MED ORDER — MIDAZOLAM HCL 5 MG/5ML IJ SOLN
INTRAMUSCULAR | Status: DC | PRN
  Administered 2016-02-28 (×2): 1 mg via INTRAVENOUS

## 2016-02-28 MED ORDER — ONDANSETRON HCL 4 MG PO TABS
4.0000 mg | ORAL_TABLET | Freq: Four times a day (QID) | ORAL | Status: DC | PRN
Start: 2016-02-28 — End: 2016-02-28

## 2016-02-28 MED ORDER — MEPERIDINE HCL 25 MG/ML IJ SOLN: 6.2500 mg | INTRAMUSCULAR | Status: DC | PRN

## 2016-02-28 MED ORDER — ONDANSETRON HCL 4 MG/2ML IJ SOLN: 4.0000 mg | Freq: Four times a day (QID) | INTRAMUSCULAR | Status: DC | PRN

## 2016-02-28 MED ORDER — METOCLOPRAMIDE HCL 5 MG/ML IJ SOLN
10.0000 mg | Freq: Once | INTRAMUSCULAR | Status: AC | PRN
  Administered 2016-02-28: 10 mg via INTRAVENOUS

## 2016-02-28 MED ORDER — FENTANYL CITRATE (PF) 100 MCG/2ML IJ SOLN
INTRAMUSCULAR | Status: AC
  Filled 2016-02-28: qty 2

## 2016-02-28 MED ORDER — FENTANYL CITRATE (PF) 100 MCG/2ML IJ SOLN
100.0000 ug | Freq: Once | INTRAMUSCULAR | Status: AC
  Administered 2016-02-28: 100 ug via INTRAVENOUS

## 2016-02-28 MED ORDER — CHLORHEXIDINE GLUCONATE 4 % EX LIQD: 60.0000 mL | Freq: Once | CUTANEOUS | Status: DC

## 2016-02-28 MED ORDER — METOCLOPRAMIDE HCL 5 MG/ML IJ SOLN
INTRAMUSCULAR | Status: AC
  Filled 2016-02-28: qty 2

## 2016-02-28 MED ORDER — BUPIVACAINE-EPINEPHRINE (PF) 0.5% -1:200000 IJ SOLN
INTRAMUSCULAR | Status: DC | PRN
  Administered 2016-02-28: 30 mL via PERINEURAL

## 2016-02-28 MED ORDER — LIDOCAINE 2% (20 MG/ML) 5 ML SYRINGE
INTRAMUSCULAR | Status: AC
  Filled 2016-02-28: qty 5

## 2016-02-28 MED ORDER — ONDANSETRON HCL 4 MG/2ML IJ SOLN
INTRAMUSCULAR | Status: AC
  Filled 2016-02-28: qty 2

## 2016-02-28 MED ORDER — LACTATED RINGERS IV SOLN: INTRAVENOUS | Status: DC

## 2016-02-28 MED ORDER — LACTATED RINGERS IV SOLN
INTRAVENOUS | Status: DC
  Administered 2016-02-28 (×2): via INTRAVENOUS

## 2016-02-28 MED ORDER — FENTANYL CITRATE (PF) 100 MCG/2ML IJ SOLN
INTRAMUSCULAR | Status: DC | PRN
  Administered 2016-02-28 (×4): 25 ug via INTRAVENOUS
  Administered 2016-02-28: 50 ug via INTRAVENOUS

## 2016-02-28 MED ORDER — ONDANSETRON HCL 4 MG/2ML IJ SOLN
INTRAMUSCULAR | Status: DC | PRN
  Administered 2016-02-28: 4 mg via INTRAVENOUS

## 2016-02-28 MED ORDER — ASPIRIN EC 325 MG PO TBEC: 325.0000 mg | DELAYED_RELEASE_TABLET | Freq: Two times a day (BID) | ORAL | Status: DC

## 2016-02-28 MED ORDER — FENTANYL CITRATE (PF) 100 MCG/2ML IJ SOLN: 25.0000 ug | INTRAMUSCULAR | Status: DC | PRN

## 2016-02-28 MED ORDER — DOCUSATE SODIUM 100 MG PO CAPS: 100.0000 mg | ORAL_CAPSULE | Freq: Two times a day (BID) | ORAL | Status: DC

## 2016-02-28 SURGICAL SUPPLY — 79 items
BANDAGE ACE 4X5 VEL STRL LF (GAUZE/BANDAGES/DRESSINGS) ×3 IMPLANT
BANDAGE ACE 6X5 VEL STRL LF (GAUZE/BANDAGES/DRESSINGS) ×3 IMPLANT
BANDAGE ELASTIC 4 VELCRO ST LF (GAUZE/BANDAGES/DRESSINGS) ×2 IMPLANT
BANDAGE ELASTIC 6 VELCRO ST LF (GAUZE/BANDAGES/DRESSINGS) ×2 IMPLANT
BANDAGE ESMARK 6X9 LF (GAUZE/BANDAGES/DRESSINGS) ×1 IMPLANT
BIT DRILL 2.5X2.75 QC CALB (BIT) ×2 IMPLANT
BIT DRILL 2.9 CANN QC NONSTRL (BIT) ×2 IMPLANT
BIT DRILL 3.5X5.5 QC CALB (BIT) ×2 IMPLANT
BNDG CMPR 9X6 STRL LF SNTH (GAUZE/BANDAGES/DRESSINGS) ×1
BNDG COHESIVE 4X5 TAN STRL (GAUZE/BANDAGES/DRESSINGS) ×3 IMPLANT
BNDG COHESIVE 6X5 TAN STRL LF (GAUZE/BANDAGES/DRESSINGS) ×3 IMPLANT
BNDG ESMARK 6X9 LF (GAUZE/BANDAGES/DRESSINGS) ×3
BNDG GAUZE ELAST 4 BULKY (GAUZE/BANDAGES/DRESSINGS) ×2 IMPLANT
CANISTER SUCT 3000ML PPV (MISCELLANEOUS) ×2 IMPLANT
CHLORAPREP W/TINT 26ML (MISCELLANEOUS) ×3 IMPLANT
CLOSURE WOUND 1/2 X4 (GAUZE/BANDAGES/DRESSINGS) ×1
CUFF TOURNIQUET SINGLE 34IN LL (TOURNIQUET CUFF) ×3 IMPLANT
DECANTER SPIKE VIAL GLASS SM (MISCELLANEOUS) ×3 IMPLANT
DRAPE C-ARM 42X72 X-RAY (DRAPES) ×3 IMPLANT
DRAPE C-ARMOR (DRAPES) ×3 IMPLANT
DRAPE U-SHAPE 47X51 STRL (DRAPES) ×2 IMPLANT
DRSG ADAPTIC 3X8 NADH LF (GAUZE/BANDAGES/DRESSINGS) ×3 IMPLANT
DRSG PAD ABDOMINAL 8X10 ST (GAUZE/BANDAGES/DRESSINGS) ×3 IMPLANT
ELECT REM PT RETURN 9FT ADLT (ELECTROSURGICAL) ×3
ELECTRODE REM PT RTRN 9FT ADLT (ELECTROSURGICAL) ×1 IMPLANT
FIXATION ZIPTIGHT ANKLE SNDSMS (Ankle) IMPLANT
GAUZE SPONGE 4X4 12PLY STRL (GAUZE/BANDAGES/DRESSINGS) ×4 IMPLANT
GLOVE BIOGEL PI IND STRL 7.5 (GLOVE) ×1 IMPLANT
GLOVE BIOGEL PI IND STRL 8.5 (GLOVE) ×1 IMPLANT
GLOVE BIOGEL PI INDICATOR 7.5 (GLOVE) ×2
GLOVE BIOGEL PI INDICATOR 8.5 (GLOVE) ×2
GLOVE ECLIPSE 8.0 STRL XLNG CF (GLOVE) IMPLANT
GLOVE ORTHO TXT STRL SZ7.5 (GLOVE) ×6 IMPLANT
GLOVE SURG ORTHO 8.0 STRL STRW (GLOVE) ×3 IMPLANT
GOWN SPEC L3 XXLG W/TWL (GOWN DISPOSABLE) ×6 IMPLANT
GOWN STRL REUS W/TWL LRG LVL3 (GOWN DISPOSABLE) ×3 IMPLANT
K-WIRE ACE 1.6X6 (WIRE) ×3
KWIRE ACE 1.6X6 (WIRE) IMPLANT
MANIFOLD NEPTUNE II (INSTRUMENTS) ×1 IMPLANT
NS IRRIG 1000ML POUR BTL (IV SOLUTION) ×3 IMPLANT
PACK ORTHO EXTREMITY (CUSTOM PROCEDURE TRAY) ×3 IMPLANT
PAD ABD 8X10 STRL (GAUZE/BANDAGES/DRESSINGS) ×2 IMPLANT
PAD ARMBOARD 7.5X6 YLW CONV (MISCELLANEOUS) ×3 IMPLANT
PAD CAST 4YDX4 CTTN HI CHSV (CAST SUPPLIES) ×2 IMPLANT
PADDING CAST ABS 6INX4YD NS (CAST SUPPLIES) ×4
PADDING CAST ABS COTTON 6X4 NS (CAST SUPPLIES) ×2 IMPLANT
PADDING CAST COTTON 4X4 STRL (CAST SUPPLIES) ×3
PADDING CAST COTTON 6X4 STRL (CAST SUPPLIES) ×2 IMPLANT
PLATE LOCK 8H 103 BILAT FIB (Plate) ×2 IMPLANT
SCREW ACE CAN 4.0 40M (Screw) ×4 IMPLANT
SCREW CORTICAL 3.5MM 24MM (Screw) ×2 IMPLANT
SCREW LOCK 3.5X10 DIST TIB (Screw) ×2 IMPLANT
SCREW LOCK CORT STAR 3.5X12 (Screw) ×2 IMPLANT
SCREW LOW PROFILE 18MMX3.5MM (Screw) ×2 IMPLANT
SCREW NON LOCKING LP 3.5 16MM (Screw) ×6 IMPLANT
SOL PREP POV-IOD 4OZ 10% (MISCELLANEOUS) ×1 IMPLANT
SPLINT PLASTER CAST XFAST 5X30 (CAST SUPPLIES) IMPLANT
SPLINT PLASTER XFAST SET 5X30 (CAST SUPPLIES) ×2
SPONGE LAP 4X18 X RAY DECT (DISPOSABLE) ×3 IMPLANT
STAPLER VISISTAT 35W (STAPLE) IMPLANT
STOCKINETTE IMPERVIOUS 9X36 MD (GAUZE/BANDAGES/DRESSINGS) ×1 IMPLANT
STRIP CLOSURE SKIN 1/2X4 (GAUZE/BANDAGES/DRESSINGS) ×2 IMPLANT
SUCTION FRAZIER TIP 10 FR DISP (SUCTIONS) ×2 IMPLANT
SUT ETHIBOND NAB CT1 #1 30IN (SUTURE) ×3 IMPLANT
SUT ETHILON 3 0 PS 1 (SUTURE) ×7 IMPLANT
SUT FIBERWIRE #2 38 T-5 BLUE (SUTURE) ×3
SUT MNCRL AB 4-0 PS2 18 (SUTURE) ×3 IMPLANT
SUT VIC AB 1 CT1 27 (SUTURE)
SUT VIC AB 1 CT1 27XBRD ANTBC (SUTURE) IMPLANT
SUT VIC AB 2-0 CT1 27 (SUTURE) ×3
SUT VIC AB 2-0 CT1 TAPERPNT 27 (SUTURE) ×1 IMPLANT
SUT VIC AB 3-0 FS2 27 (SUTURE) ×2 IMPLANT
SUT VIC AB 3-0 PS2 18 (SUTURE)
SUT VIC AB 3-0 PS2 18XBRD (SUTURE) IMPLANT
SUTURE FIBERWR #2 38 T-5 BLUE (SUTURE) ×1 IMPLANT
TOWEL OR 17X26 10 PK STRL BLUE (TOWEL DISPOSABLE) ×6 IMPLANT
TUBE CONNECTING 12'X1/4 (SUCTIONS) ×1
TUBE CONNECTING 12X1/4 (SUCTIONS) ×1 IMPLANT
ZIPTIGHT ANKLE SYNODESMOSS FIX (Ankle) ×3 IMPLANT

## 2016-02-28 NOTE — Discharge Instructions (Signed)
Keep splint clean and dry. Do not remove. Non weight bearing left leg. Ice, elevation

## 2016-02-28 NOTE — Anesthesia Procedure Notes (Addendum)
Anesthesia Regional Block:  Adductor canal block  Pre-Anesthetic Checklist: ,, timeout performed, Correct Patient, Correct Site, Correct Laterality, Correct Procedure, Correct Position, site marked, Risks and benefits discussed,  Surgical consent,  Pre-op evaluation,  At surgeon's request and post-op pain management  Laterality: Left and Lower  Prep: Maximum Sterile Barrier Precautions used and chloraprep       Needles:  Injection technique: Single-shot  Needle Type: Echogenic Stimulator Needle     Needle Length: 10cm 10 cm Needle Gauge: 21 and 21 G    Additional Needles:  Procedures: ultrasound guided (picture in chart) and nerve stimulator Adductor canal block Narrative:  Injection made incrementally with aspirations every 5 mL.  Performed by: Personally   Additional Notes: Patient tolerated the procedure well without complications   Anesthesia Regional Block:  Popliteal block  Pre-Anesthetic Checklist: ,, timeout performed, Correct Patient, Correct Site, Correct Laterality, Correct Procedure, Correct Position, site marked, Risks and benefits discussed,  Surgical consent,  Pre-op evaluation,  At surgeon's request and post-op pain management  Laterality: Left and Lower  Prep: Maximum Sterile Barrier Precautions used and chloraprep       Needles:  Injection technique: Single-shot  Needle Type: Echogenic Stimulator Needle     Needle Length: 10cm 10 cm Needle Gauge: 21 and 21 G    Additional Needles:  Procedures: ultrasound guided (picture in chart) and nerve stimulator Popliteal block Narrative:  Injection made incrementally with aspirations every 5 mL.  Performed by: Personally   Additional Notes: Patient tolerated the procedure well without complications   Procedure Name: LMA Insertion Date/Time: 02/28/2016 2:28 PM Performed by: Darcey NoraJAMES, Tatianna Ibbotson B Pre-anesthesia Checklist: Patient identified, Emergency Drugs available, Suction available and Patient being  monitored Patient Re-evaluated:Patient Re-evaluated prior to inductionOxygen Delivery Method: Circle system utilized Preoxygenation: Pre-oxygenation with 100% oxygen Intubation Type: IV induction Ventilation: Mask ventilation without difficulty LMA: LMA inserted LMA Size: 4.0 Tube type: Oral Number of attempts: 1 Placement Confirmation: breath sounds checked- equal and bilateral and positive ETCO2 Tube secured with: Tape (taped across cheeks) Dental Injury: Teeth and Oropharynx as per pre-operative assessment

## 2016-02-28 NOTE — H&P (Signed)
PREOPERATIVE H&P  Chief Complaint: left ankle fracture trimalleolar  HPI: Linda Barry is a 25 y.o. female who presents for preoperative history and physical with a diagnosis of left ankle fracture trimalleolar. She has been icing and elevating, waiting for swelling to subside. She presents today for fixation of her left ankle fracture   Past Medical History  Diagnosis Date  . Depression   . Sickle cell trait (HCC)   . Normal pregnancy 04/15/2012  . SVD (spontaneous vaginal delivery) 04/15/2012  . Placenta abruption, delivered, current hospitalization 04/15/2012   History reviewed. No pertinent past surgical history. Social History   Social History  . Marital Status: Single    Spouse Name: N/A  . Number of Children: N/A  . Years of Education: N/A   Social History Main Topics  . Smoking status: Current Every Day Smoker -- 0.25 packs/day    Types: Cigarettes    Last Attempt to Quit: 02/01/2012  . Smokeless tobacco: Never Used  . Alcohol Use: No  . Drug Use: No  . Sexual Activity: Yes    Birth Control/ Protection: Implant   Other Topics Concern  . None   Social History Narrative   Family History  Problem Relation Age of Onset  . Other Neg Hx    Allergies  Allergen Reactions  . Latex Other (See Comments)    Gloves irritate eczema on hands, no problems elsewhere per patient   Prior to Admission medications   Medication Sig Start Date End Date Taking? Authorizing Provider  naproxen sodium (ANAPROX) 220 MG tablet Take 220 mg by mouth 2 (two) times daily as needed.   Yes Historical Provider, MD  ondansetron (ZOFRAN ODT) 4 MG disintegrating tablet Take 1 tablet (4 mg total) by mouth every 8 (eight) hours as needed for nausea or vomiting. 02/15/16  Yes Melene Planan Floyd, DO  oxyCODONE (ROXICODONE) 5 MG immediate release tablet Take 1 tablet (5 mg total) by mouth every 4 (four) hours as needed for severe pain. 02/15/16  Yes Melene Planan Floyd, DO  etonogestrel (IMPLANON) 68 MG IMPL implant Inject 1  each into the skin once. Patient stated has expires    Historical Provider, MD     Positive ROS: All other systems have been reviewed and were otherwise negative with the exception of those mentioned in the HPI and as above.  Physical Exam: General: Alert, no acute distress Cardiovascular: No pedal edema Respiratory: No cyanosis, no use of accessory musculature GI: No organomegaly, abdomen is soft and non-tender Skin: No lesions in the area of chief complaint Neurologic: Sensation intact distally Psychiatric: Patient is competent for consent with normal mood and affect Lymphatic: No axillary or cervical lymphadenopathy  MUSCULOSKELETAL: Examination of the left lower extremity reveals an intact splint. The splint was removed her skin is intact. Swelling is much improved. She does have skin wrinkling present. She reports subjective sensory change in the superficial peroneal distribution. She has a palpable pulse she is able to wiggle her toes.  Assessment: left ankle fracture trimalleolar  Plan: Plan for Procedure(s): OPEN REDUCTION INTERNAL FIXATION (ORIF) LEFT ANKLE FRACTURE  The risks benefits and alternatives were discussed with the patient including but not limited to the risks of nonoperative treatment, versus surgical intervention including infection, bleeding, nerve injury,  blood clots, cardiopulmonary complications, morbidity, mortality, among others, and they were willing to proceed.   Ainsley Deakins, Cloyde ReamsBrian James, MD Cell [phone]   02/28/2016 2:12 PM

## 2016-02-28 NOTE — Anesthesia Postprocedure Evaluation (Signed)
Anesthesia Post Note  Patient: Linda Barry  Procedure(s) Performed: Procedure(s) (LRB): OPEN REDUCTION INTERNAL FIXATION (ORIF) LEFT ANKLE FRACTURE (Left)  Patient location during evaluation: PACU Anesthesia Type: General Level of consciousness: awake and alert Pain management: pain level controlled Vital Signs Assessment: post-procedure vital signs reviewed and stable Respiratory status: spontaneous breathing, nonlabored ventilation and respiratory function stable Cardiovascular status: blood pressure returned to baseline and stable Postop Assessment: no signs of nausea or vomiting Anesthetic complications: no    Last Vitals:  Filed Vitals:   02/28/16 1800 02/28/16 1815  BP: 104/69   Pulse: 56 63  Temp:    Resp: 16 16    Last Pain:  Filed Vitals:   02/28/16 1911  PainSc: Asleep                 Deetta Siegmann A

## 2016-02-28 NOTE — Anesthesia Preprocedure Evaluation (Addendum)
Anesthesia Evaluation  Patient identified by MRN, date of birth, ID band Patient awake    Reviewed: Allergy & Precautions, NPO status , Patient's Chart, lab work & pertinent test results  Airway Mallampati: II  TM Distance: >3 FB Neck ROM: Full    Dental no notable dental hx. (+) Teeth Intact, Dental Advisory Given   Pulmonary neg pulmonary ROS, Current Smoker,    Pulmonary exam normal breath sounds clear to auscultation       Cardiovascular negative cardio ROS Normal cardiovascular exam Rhythm:Regular Rate:Normal     Neuro/Psych negative neurological ROS  negative psych ROS   GI/Hepatic negative GI ROS, Neg liver ROS,   Endo/Other  negative endocrine ROS  Renal/GU negative Renal ROS  negative genitourinary   Musculoskeletal negative musculoskeletal ROS (+)   Abdominal   Peds negative pediatric ROS (+)  Hematology negative hematology ROS (+)   Anesthesia Other Findings   Reproductive/Obstetrics negative OB ROS                           Anesthesia Physical Anesthesia Plan  ASA: II  Anesthesia Plan: General   Post-op Pain Management: GA combined w/ Regional for post-op pain   Induction: Intravenous  Airway Management Planned: LMA  Additional Equipment:   Intra-op Plan:   Post-operative Plan: Extubation in OR  Informed Consent: I have reviewed the patients History and Physical, chart, labs and discussed the procedure including the risks, benefits and alternatives for the proposed anesthesia with the patient or authorized representative who has indicated his/her understanding and acceptance.   Dental advisory given  Plan Discussed with: CRNA  Anesthesia Plan Comments: (Popliteal block)        Anesthesia Quick Evaluation

## 2016-02-28 NOTE — Transfer of Care (Signed)
Immediate Anesthesia Transfer of Care Note  Patient: Linda Barry  Procedure(s) Performed: Procedure(s): OPEN REDUCTION INTERNAL FIXATION (ORIF) LEFT ANKLE FRACTURE (Left)  Patient Location: PACU  Anesthesia Type:GA combined with regional for post-op pain  Level of Consciousness: awake, alert  and oriented  Airway & Oxygen Therapy: Patient Spontanous Breathing and Patient connected to nasal cannula oxygen  Post-op Assessment: Report given to RN and Post -op Vital signs reviewed and stable  Post vital signs: Reviewed and stable  Last Vitals:  Filed Vitals:   02/28/16 1400 02/28/16 1405  BP: 93/48 95/48  Pulse: 72 65  Temp:    Resp: 14 21    Last Pain:  Filed Vitals:   02/28/16 1408  PainSc: 5       Patients Stated Pain Goal: 3 (02/28/16 1258)  Complications: No apparent anesthesia complications

## 2016-02-28 NOTE — Brief Op Note (Signed)
02/28/2016  4:35 PM  PATIENT:  Linda MartinezParis Barry  25 y.o. female  PRE-OPERATIVE DIAGNOSIS:  left ankle trimalleolar fracture   POST-OPERATIVE DIAGNOSIS:  left ankle trimalleolar fracture   PROCEDURE:  Procedure(s): OPEN REDUCTION INTERNAL FIXATION (ORIF) LEFT ANKLE FRACTURE (Left)  SURGEON:  Surgeon(s) and Role:    * Samson FredericBrian Evalena Fujii, MD - Primary  PHYSICIAN ASSISTANT: none  ASSISTANTS: Hart CarwinJustin Queen, RNFA   ANESTHESIA:   regional and general  EBL:  Total I/O In: 1000 [I.V.:1000] Out: -   BLOOD ADMINISTERED:none  DRAINS: none   LOCAL MEDICATIONS USED:  NONE  SPECIMEN:  No Specimen  DISPOSITION OF SPECIMEN:  N/A  COUNTS:  YES  TOURNIQUET:  * Missing tourniquet times found for documented tourniquets in log:  [id number] *  DICTATION: .Other Dictation: Dictation Number [id number]  PLAN OF CARE: Discharge to home after PACU  PATIENT DISPOSITION:  PACU - hemodynamically stable.   Delay start of Pharmacological VTE agent (>24hrs) due to surgical blood loss or risk of bleeding: no

## 2016-02-29 NOTE — Op Note (Signed)
NAMEBRANDIS, WIXTED NO.:  [id number]  MEDICAL RECORD NO.:  [id number]  LOCATION:  [facility]                         FACILITY:  [facility]  PHYSICIAN:  Samson Frederic, MD     DATE OF BIRTH:  04-29-1991  DATE OF PROCEDURE:  02/28/2016 DATE OF DISCHARGE:  02/28/2016                              OPERATIVE REPORT   SURGEON:  Samson Frederic, MD.  ASSISTANT:  Hart Carwin, RNFA.  PREOPERATIVE DIAGNOSIS:  Left trimalleolar ankle fracture.  POSTOPERATIVE DIAGNOSIS:  Left trimalleolar ankle fracture.  PROCEDURE PERFORMED: 1. Open reduction and internal fixation of left ankle fracture, medial     and lateral malleoli. 2. Open treatment of syndesmosis.  IMPLANTS: 1. Biomet ALPS 8-hole locking composite plate with 3.5 mm screws. 2. 4.0 mm cannulated screws x2.   3. ZipTight syndesmotic implant x1.  ANESTHESIA:  Regional plus general.  EBL:  Minimal.  COMPLICATIONS:  None.  TUBES AND DRAINS:  None.  DISPOSITION:  Stable to PACU.  INDICATIONS:  The patient is a 25 year old female, who missed a couple of steps and injured her left ankle.  She had a left trimalleolar ankle fracture with lateral subluxation of the talus.  I saw her in the emergency department, performed a closed reduction and splinting.  We have been waiting for the swelling to go down.  Risks, benefits, and alternatives to ORIF of left ankle were explained.  The patient elected to proceed.  DESCRIPTION OF PROCEDURE IN DETAIL:  I identified the patient in the holding using 2 identifiers.  The patient had a preop adductor block and popliteal nerve block.  She was taken to the operating room, placed supine on the operating table.  General anesthesia was induced. Tourniquet was applied to the upper thigh.  Left lower extremity was prepped and draped in normal sterile surgical fashion.  Time-out was called verifying side and site of surgery.  She did receive 2 g of Ancef within 60 minutes of beginning  the procedure.  I began by using gravity to exsanguinate the lower extremity, elevated the tourniquet to 300 mmHg.  I made a standard longitudinal incision over the fibula. Distally, I carried the dissection down to bone proximally and careful blunt dissection was performed to protect the superficial peroneal nerve.  I identified the fracture.  I cleaned the fracture site with a curette and rongeur.  I irrigated the fracture hematoma.  I reduced the fracture with traction.  I placed a lobster claw clamp.  Using AO technique, I placed a single lag screw from anterior to posterior.  I then selected an 8-hole plate.  I bent the plate.  I held it over the bone, I provisionally fixed it with K-wires.  I then applied 2 compression screws proximally and a single 1 distally.  I then placed 2 distal locking screws and then I placed a third proximal bicortical nonlocking screw.  I then turned my attention medially and made a standard longitudinal incision over the medial malleolus.  Dissection was carried sharply to bone.  I identified the fracture site.  I cleaned the periosteum from the fracture site.  I held the reduction with a dental pick, and then I placed  2 guidewires across the fracture site. Her fracture was an oblique fracture mostly of the anterior colliculus, therefore I passed my wires in the anterior aspect of the tibia.  I sequentially measured, drilled the near cortex and then I placed two 40 mm screws.  AP and lateral fluoroscopy views were obtained.  I performed a live external rotation stress test, and she had mild medial clear space widening.  I therefore placed a single ZipTight implant tetra- cortically.  The sutures were clipped.  Final AP and lateral fluoroscopy views were obtained.  Fracture was nearly anatomically reduced.  She did have a butterfly fragment posteriorly in the fibula fracture.  She had a small posterior malleolus piece, it was too small for hardware  fixation. I repeated her live external rotation stress test.  There was no lateral shifting of the talus or medial clear space widening.  The wounds were copiously irrigated with saline.  I let the tourniquet down.  Meticulous hemostasis was achieved with Bovie electrocautery.  I closed the fascia with 2-0 Vicryl, deep dermal layer with 3-0 Vicryl, and skin with 3-0 nylon using a modified Allgower-Donati stitch.  Sterile dressing was applied, followed by a well-padded well-molded posterior splint with U. The patient was extubated and taken to PACU in stable condition. Sponge, needle, and instrument counts were correct at the end of the case x2.  There were no complications.  She may be discharged from the PACU.  She will be nonweightbearing on left lower extremity.  She will keep the splint clean and dry.  Elevate and ice the left lower extremity.  I will see her in the office in 2 weeks.          ______________________________ Samson FredericBrian Gatlin Kittell, MD     BS/MEDQ  D:  02/28/2016  T:  02/29/2016  Job:  [id number]

## 2016-03-02 ENCOUNTER — Encounter (HOSPITAL_COMMUNITY): Payer: Self-pay | Admitting: Orthopedic Surgery

## 2016-09-14 NOTE — L&D Delivery Note (Signed)
Delivery Note Pt reached complete dilation and +2 station after epidural feeling pressure.  AROM performed and with considerable coaching the patient was able to push somewhat effectively.  At 4:55 AM a healthy female was delivered via  (Presentation: OA  ).  APGAR:8 ,9 ; weight  .   Placenta status: delivered spontaneously with fundal massage .  Cord:  with the following complications: tight nuchal delivered through .  Anesthesia:  epidural Episiotomy:  none Lacerations:  none Suture Repair: n/a Est. Blood Loss (mL):  175  Mom to postpartum.  Baby to Couplet care / Skin to Skin.  D/w pt circumcision and they are having it performed at another facility.  Linda Barry 08/26/2017, 5:10 AM

## 2017-01-11 ENCOUNTER — Encounter (HOSPITAL_COMMUNITY): Payer: Self-pay | Admitting: *Deleted

## 2017-01-11 ENCOUNTER — Inpatient Hospital Stay (HOSPITAL_COMMUNITY)
Admission: AD | Admit: 2017-01-11 | Discharge: 2017-01-11 | Disposition: A | Discharge status: Home / Self Care | Payer: Medicaid Other | Source: Ambulatory Visit | Attending: Family Medicine | Admitting: Family Medicine

## 2017-01-11 ENCOUNTER — Inpatient Hospital Stay (HOSPITAL_COMMUNITY): Payer: Medicaid Other

## 2017-01-11 DIAGNOSIS — R109 Unspecified abdominal pain: Secondary | ICD-10-CM | POA: Diagnosis present

## 2017-01-11 DIAGNOSIS — O26891 Other specified pregnancy related conditions, first trimester: Secondary | ICD-10-CM | POA: Diagnosis not present

## 2017-01-11 DIAGNOSIS — D573 Sickle-cell trait: Secondary | ICD-10-CM | POA: Diagnosis not present

## 2017-01-11 DIAGNOSIS — O26899 Other specified pregnancy related conditions, unspecified trimester: Secondary | ICD-10-CM | POA: Diagnosis not present

## 2017-01-11 DIAGNOSIS — Z91040 Latex allergy status: Secondary | ICD-10-CM | POA: Insufficient documentation

## 2017-01-11 DIAGNOSIS — O99011 Anemia complicating pregnancy, first trimester: Secondary | ICD-10-CM | POA: Diagnosis not present

## 2017-01-11 DIAGNOSIS — F17210 Nicotine dependence, cigarettes, uncomplicated: Secondary | ICD-10-CM | POA: Diagnosis not present

## 2017-01-11 DIAGNOSIS — R51 Headache: Secondary | ICD-10-CM | POA: Insufficient documentation

## 2017-01-11 DIAGNOSIS — O99331 Smoking (tobacco) complicating pregnancy, first trimester: Secondary | ICD-10-CM | POA: Insufficient documentation

## 2017-01-11 DIAGNOSIS — Z3A01 Less than 8 weeks gestation of pregnancy: Secondary | ICD-10-CM | POA: Diagnosis not present

## 2017-01-11 DIAGNOSIS — R519 Headache, unspecified: Secondary | ICD-10-CM

## 2017-01-11 LAB — CBC
HEMATOCRIT: 31.3 % — AB (ref 36.0–46.0)
HEMOGLOBIN: 11.1 g/dL — AB (ref 12.0–15.0)
MCH: 26.6 pg (ref 26.0–34.0)
MCHC: 35.5 g/dL (ref 30.0–36.0)
MCV: 75.1 fL — AB (ref 78.0–100.0)
Platelets: 242 10*3/uL (ref 150–400)
RBC: 4.17 MIL/uL (ref 3.87–5.11)
RDW: 13.4 % (ref 11.5–15.5)
WBC: 8.5 10*3/uL (ref 4.0–10.5)

## 2017-01-11 LAB — WET PREP, GENITAL
Clue Cells Wet Prep HPF POC: NONE SEEN
SPERM: NONE SEEN
Trich, Wet Prep: NONE SEEN
Yeast Wet Prep HPF POC: NONE SEEN

## 2017-01-11 LAB — URINALYSIS, ROUTINE W REFLEX MICROSCOPIC
BILIRUBIN URINE: NEGATIVE
Glucose, UA: NEGATIVE mg/dL
HGB URINE DIPSTICK: NEGATIVE
Ketones, ur: 20 mg/dL — AB
NITRITE: NEGATIVE
PH: 6 (ref 5.0–8.0)
Protein, ur: NEGATIVE mg/dL
SPECIFIC GRAVITY, URINE: 1.024 (ref 1.005–1.030)

## 2017-01-11 LAB — HCG, QUANTITATIVE, PREGNANCY: hCG, Beta Chain, Quant, S: 65966 m[IU]/mL — ABNORMAL HIGH (ref <5)

## 2017-01-11 LAB — POCT PREGNANCY, URINE: PREG TEST UR: POSITIVE — AB

## 2017-01-11 LAB — ABO/RH: ABO/RH(D): O POS

## 2017-01-11 MED ORDER — ACETAMINOPHEN 325 MG PO TABS
650.0000 mg | ORAL_TABLET | Freq: Four times a day (QID) | ORAL | Status: DC | PRN
  Administered 2017-01-11: 650 mg via ORAL
  Filled 2017-01-11: qty 2

## 2017-01-11 NOTE — Discharge Instructions (Signed)
Eating Plan for Pregnant Women While you are pregnant, your body will require additional nutrition to help support your growing baby. It is recommended that you consume:  150 additional calories each day during your first trimester.  300 additional calories each day during your second trimester.  300 additional calories each day during your third trimester. Eating a healthy, well-balanced diet is very important for your health and for your baby's health. You also have a higher need for some vitamins and minerals, such as folic acid, calcium, iron, and vitamin D. What do I need to know about eating during pregnancy?  Do not try to lose weight or go on a diet during pregnancy.  Choose healthy, nutritious foods. Choose  of a sandwich with a glass of milk instead of a candy bar or a high-calorie sugar-sweetened beverage.  Limit your overall intake of foods that have "empty calories." These are foods that have little nutritional value, such as sweets, desserts, candies, sugar-sweetened beverages, and fried foods.  Eat a variety of foods, especially fruits and vegetables.  Take a prenatal vitamin to help meet the additional needs during pregnancy, specifically for folic acid, iron, calcium, and vitamin D.  Remember to stay active. Ask your health care provider for exercise recommendations that are specific to you.  Practice good food safety and cleanliness, such as washing your hands before you eat and after you prepare raw meat. This helps to prevent foodborne illnesses, such as listeriosis, that can be very dangerous for your baby. Ask your health care provider for more information about listeriosis. What does 150 extra calories look like? Healthy options for an additional 150 calories each day could be any of the following:  Plain low-fat yogurt (6-8 oz) with  cup of berries.  1 apple with 2 teaspoons of peanut butter.  Cut-up vegetables with  cup of hummus.  Low-fat chocolate milk  (8 oz or 1 cup).  1 string cheese with 1 medium orange.   of a peanut butter and jelly sandwich on whole-wheat bread (1 tsp of peanut butter). For 300 calories, you could eat two of those healthy options each day. What is a healthy amount of weight to gain? The recommended amount of weight for you to gain is based on your pre-pregnancy BMI. If your pre-pregnancy BMI was:  Less than 18 (underweight), you should gain 28-40 lb.  18-24.9 (normal), you should gain 25-35 lb.  25-29.9 (overweight), you should gain 15-25 lb.  Greater than 30 (obese), you should gain 11-20 lb. What if I am having twins or multiples? Generally, pregnant women who will be having twins or multiples may need to increase their daily calories by 300-600 calories each day. The recommended range for total weight gain is 25-54 lb, depending on your pre-pregnancy BMI. Talk with your health care provider for specific guidance about additional nutritional needs, weight gain, and exercise during your pregnancy. What foods can I eat? Grains  Any grains. Try to choose whole grains, such as whole-wheat bread, oatmeal, or brown rice. Vegetables  Any vegetables. Try to eat a variety of colors and types of vegetables to get a full range of vitamins and minerals. Remember to wash your vegetables well before eating. Fruits  Any fruits. Try to eat a variety of colors and types of fruit to get a full range of vitamins and minerals. Remember to wash your fruits well before eating. Meats and Other Protein Sources  Lean meats, including chicken, turkey, fish, and lean cuts of beef,   veal, or pork. Make sure that all meats are cooked to "well done." Tofu. Tempeh. Beans. Eggs. Peanut butter and other nut butters. Seafood, such as shrimp, crab, and lobster. If you choose fish, select types that are higher in omega-3 fatty acids, including salmon, herring, mussels, trout, sardines, and pollock. Make sure that all meats are cooked to food-safe  temperatures. Dairy  Pasteurized milk and milk alternatives. Pasteurized yogurt and pasteurized cheese. Cottage cheese. Sour cream. Beverages  Water. Juices that contain 100% fruit juice or vegetable juice. Caffeine-free teas and decaffeinated coffee. Drinks that contain caffeine are okay to drink, but it is better to avoid caffeine. Keep your total caffeine intake to less than 200 mg each day (12 oz of coffee, tea, or soda) or as directed by your health care provider. Condiments  Any pasteurized condiments. Sweets and Desserts  Any sweets and desserts. Fats and Oils  Any fats and oils. The items listed above may not be a complete list of recommended foods or beverages. Contact your dietitian for more options.  What foods are not recommended? Vegetables  Unpasteurized (raw) vegetable juices. Fruits  Unpasteurized (raw) fruit juices. Meats and Other Protein Sources  Cured meats that have nitrates, such as bacon, salami, and hotdogs. Luncheon meats, bologna, or other deli meats (unless they are reheated until they are steaming hot). Refrigerated pate, meat spreads from a meat counter, smoked seafood that is found in the refrigerated section of a store. Raw fish, such as sushi or sashimi. High mercury content fish, such as tilefish, shark, swordfish, and king mackerel. Raw meats, such as tuna or beef tartare. Undercooked meats and poultry. Make sure that all meats are cooked to food-safe temperatures. Dairy  Unpasteurized (raw) milk and any foods that have raw milk in them. Soft cheeses, such as feta, queso blanco, queso fresco, Brie, Camembert cheeses, blue-veined cheeses, and Panela cheese (unless it is made with pasteurized milk, which must be stated on the label). Beverages  Alcohol. Sugar-sweetened beverages, such as sodas, teas, or energy drinks. Condiments  Homemade fermented foods and drinks, such as pickles, sauerkraut, or kombucha drinks. (Store-bought pasteurized versions of these are  okay.) Other  Salads that are made in the store, such as ham salad, chicken salad, egg salad, tuna salad, and seafood salad. The items listed above may not be a complete list of foods and beverages to avoid. Contact your dietitian for more information.  This information is not intended to replace advice given to you by your health care provider. Make sure you discuss any questions you have with your health care provider. Document Released: 06/15/2014 Document Revised: 02/06/2016 Document Reviewed: 02/13/2014 Elsevier Interactive Patient Education  2017 [employer]  

## 2017-01-11 NOTE — MAU Note (Signed)
Patient vomited after drinking water and administration of tylenol upon discharge.   Crisoforo Oxford, CNM in department and made aware.  CNM to bedside. Patient request to continue with discharge. States she feels like she just needs to eat. CNM instructed on bland diet. Discharge proceeded.

## 2017-01-11 NOTE — MAU Note (Signed)
Pt C/O HA's every day for aproximately the last 3 weeks, also having lower abd cramping today, dizziness.  Denies vomiting or diarrhea.  No vaginal bleeding.  Pos HPT 2 weeks ago.  No hx of migraines.

## 2017-01-11 NOTE — MAU Provider Note (Signed)
History    Patient Linda Barry is a 26 year old G3P2002 At [redacted]w[redacted]d Here with complaints of abdominal pain that comes and goes for the past three weeks as well as constant headache. She does not currently have the abdominal pain at this moment. She denies vaginal bleeding, NV or painful urination or abnormal vaginal discharge.  CSN: [id number]  Arrival date and time: 01/11/17 1119   First Provider Initiated Contact with Patient 01/11/17 1142      Chief Complaint  Patient presents with  . Headache  . Abdominal Pain   Headache   This is a recurrent problem. The current episode started 1 to 4 weeks ago. The problem occurs constantly. The problem has been unchanged. The pain is located in the frontal region. The quality of the pain is described as aching. Associated symptoms include abdominal pain. Exacerbated by: the HA is worse in the mornign when she hasn't eaten and then gets better once she has eaten. She has tried nothing for the symptoms.  Abdominal Pain  This is a recurrent problem. The current episode started 1 to 4 weeks ago. The onset quality is sudden. The problem occurs intermittently. The problem has been unchanged. The pain is located in the suprapubic region. The pain is at a severity of 7/10. The pain is moderate. The quality of the pain is cramping. The abdominal pain does not radiate. Associated symptoms include headaches. Exacerbated by: walking. The pain is relieved by nothing. Treatments tried: drinking ginger ale. The treatment provided no relief.  She does not think she is constipated.  Yesterday she had a slice to pizza to eat, and the day before that she had 2 sausage and egg biscuits, candy and chips and [employer]. No nausea and vomiting.  Yesterday she had 3 twelze oz bottles of water to drink.  She says that this is normal water intake for her.  She has started drinking soda because she thought that would help her abdominal pain.  OB History    Gravida Para Term  Preterm AB Living   SAB TAB Ectopic Multiple Live Births           2      Past Medical History:  Diagnosis Date  . Depression   . Normal pregnancy 04/15/2012  . Placenta abruption, delivered, current hospitalization 04/15/2012  . Sickle cell trait (HCC)   . SVD (spontaneous vaginal delivery) 04/15/2012    Past Surgical History:  Procedure Laterality Date  . ORIF ANKLE FRACTURE Left 02/28/2016   Procedure: OPEN REDUCTION INTERNAL FIXATION (ORIF) LEFT ANKLE FRACTURE;  Surgeon: Samson Frederic, MD;  Location: [facility] OR;  Service: Orthopedics;  Laterality: Left;    Family History  Problem Relation Age of Onset  . Other Neg Hx     Social History  Substance Use Topics  . Smoking status: Current Every Day Smoker    Packs/day: 0.25    Types: Cigarettes    Last attempt to quit: 02/01/2012  . Smokeless tobacco: Never Used  . Alcohol use No    Allergies:  Allergies  Allergen Reactions  . Latex Other (See Comments)    Gloves irritate eczema on hands, no problems elsewhere per patient    Prescriptions Prior to Admission  Medication Sig Dispense Refill Last Dose  . hydrocortisone 1 % ointment Apply 1 application topically 3 (three) times daily as needed for itching. For dry skin on hands   01/10/2017 at Unknown  time  . aspirin EC 325 MG tablet Take 1 tablet (325 mg total) by mouth 2 (two) times daily after a meal. (Patient not taking: Reported on 01/11/2017) 28 tablet 0 Not Taking at Unknown time  . docusate sodium (COLACE) 100 MG capsule Take 1 capsule (100 mg total) by mouth 2 (two) times daily. (Patient not taking: Reported on 01/11/2017) 60 capsule 3 Not Taking at Unknown time  . HYDROcodone-acetaminophen (NORCO) 7.5-325 MG tablet Take 1-2 tablets by mouth every 4 (four) hours as needed for moderate pain. (Patient not taking: Reported on 01/11/2017) 80 tablet 0 Not Taking at Unknown time  . ondansetron (ZOFRAN) 4 MG tablet Take 1 tablet (4 mg total) by mouth every 8 (eight) hours  as needed for nausea or vomiting. (Patient not taking: Reported on 01/11/2017) 20 tablet 0 Not Taking at Unknown time  . senna (SENOKOT) 8.6 MG TABS tablet Take 2 tablets (17.2 mg total) by mouth at bedtime. (Patient not taking: Reported on 01/11/2017) 60 each 3 Not Taking at Unknown time    Review of Systems  Respiratory: Negative.   Cardiovascular: Negative.   Gastrointestinal: Positive for abdominal pain.  Musculoskeletal: Negative.   Neurological: Positive for headaches.   Physical Exam   Blood pressure 96/63, pulse 75, temperature 98.4 F (36.9 C), temperature source Oral, resp. rate 16, height  (1.676 m), weight 65.8 kg (145 lb), last menstrual period 11/26/2016.  Physical Exam  Constitutional: She is oriented to person, place, and time. She appears well-developed.  HENT:  Head: Normocephalic.  Neck: Normal range of motion.  Respiratory: Effort normal.  GI: Soft. She exhibits no distension and no mass. There is no tenderness. There is no rebound and no guarding.  Genitourinary:  Genitourinary Comments: NEFG; no lesions on vaginal walls. No blood in the vagina, no CMT, slight suprapubic tenderness, no adnexal tenderness.   Musculoskeletal: Normal range of motion.  Neurological: She is alert and oriented to person, place, and time.  Skin: Skin is warm and dry.    MAU Course  Procedures  MDM -CBC, abo, beta: normal -[place]: SIUP at 6 weeks and 4 days -Tylenol for HA -wet prep: normal  -GC CT pending -UA : 20 of ketones  US Ob Comp Less 14 Wks  Result Date: 01/11/2017 CLINICAL DATA:  Abdominal cramping today EXAM: OBSTETRIC <14 WK [place] AND TRANSVAGINAL OB US TECHNIQUE: Both transabdominal and transvaginal ultrasound examinations were performed for complete evaluation of the gestation as well as the maternal uterus, adnexal regions, and pelvic cul-de-sac. Transvaginal technique was performed to assess early pregnancy. COMPARISON:  None. FINDINGS: Intrauterine gestational sac:  Single Yolk sac:  Visualized Embryo:  Visualized Cardiac Activity: Visualized Heart Rate: 130  bpm MSD:   mm    w     d CRL:  7.9  mm   6 w   4 d                  [place] EDC: 09/02/2017 Subchorionic hemorrhage:  Small subchorionic hemorrhage Maternal uterus/adnexae: No adnexal masses or free fluid. IMPRESSION: Six week 4 day intrauterine pregnancy. Fetal heart rate 130 beats per minute. No acute maternal findings. Electronically Signed   By: Charlett Nose M.D.   On: 01/11/2017 13:23   US Ob Transvaginal  Result Date: 01/11/2017 CLINICAL DATA:  Abdominal cramping today EXAM: OBSTETRIC <14 WK [place] AND TRANSVAGINAL OB US TECHNIQUE: Both transabdominal and transvaginal ultrasound examinations were performed for complete evaluation of the gestation as well as the  maternal uterus, adnexal regions, and pelvic cul-de-sac. Transvaginal technique was performed to assess early pregnancy. COMPARISON:  None. FINDINGS: Intrauterine gestational sac: Single Yolk sac:  Visualized Embryo:  Visualized Cardiac Activity: Visualized Heart Rate: 130  bpm MSD:   mm    w     d CRL:  7.9  mm   6 w   4 d                  [place] EDC: 09/02/2017 Subchorionic hemorrhage:  Small subchorionic hemorrhage Maternal uterus/adnexae: No adnexal masses or free fluid. IMPRESSION: Six week 4 day intrauterine pregnancy. Fetal heart rate 130 beats per minute. No acute maternal findings. Electronically Signed   By: Charlett Nose M.D.   On: 01/11/2017 13:23    Assessment and Plan   1. Abdominal pain affecting pregnancy   2. Abdominal pain in pregnancy   3. Headache in pregnancy, antepartum    2. SIUP at 6 weeks 4 days;  Ectopic pregnancy excluded.  3. Patient stable for discharge with recommendations to increase fiber and fruits and decrease fat and grease in diet. Recommended that patient stop drinking soda and instead drink water. Advised patient that her GI pains and HA are most likely related to diet and water intake.  4. Reviewed when to return to the  MAU (bleeding, severe abdominal cramps that are continuous and do not resolve after rest or Tylenol)  Charlesetta Garibaldi Verenis Nicosia CNM 01/11/2017, 1:10 PM

## 2017-01-12 LAB — URINE CULTURE

## 2017-01-12 LAB — GC/CHLAMYDIA PROBE AMP (~~LOC~~) NOT AT ARMC
CHLAMYDIA, DNA PROBE: NEGATIVE
Neisseria Gonorrhea: NEGATIVE

## 2017-01-19 ENCOUNTER — Inpatient Hospital Stay (HOSPITAL_COMMUNITY)
Admission: AD | Admit: 2017-01-19 | Discharge: 2017-01-19 | Disposition: A | Discharge status: Home / Self Care | Payer: Medicaid Other | Source: Ambulatory Visit | Attending: Family Medicine | Admitting: Family Medicine

## 2017-01-19 ENCOUNTER — Encounter (HOSPITAL_COMMUNITY): Payer: Self-pay | Admitting: *Deleted

## 2017-01-19 DIAGNOSIS — D573 Sickle-cell trait: Secondary | ICD-10-CM | POA: Insufficient documentation

## 2017-01-19 DIAGNOSIS — O26891 Other specified pregnancy related conditions, first trimester: Secondary | ICD-10-CM | POA: Diagnosis present

## 2017-01-19 DIAGNOSIS — O99341 Other mental disorders complicating pregnancy, first trimester: Secondary | ICD-10-CM | POA: Insufficient documentation

## 2017-01-19 DIAGNOSIS — Z3A01 Less than 8 weeks gestation of pregnancy: Secondary | ICD-10-CM | POA: Insufficient documentation

## 2017-01-19 DIAGNOSIS — Z87891 Personal history of nicotine dependence: Secondary | ICD-10-CM | POA: Diagnosis not present

## 2017-01-19 DIAGNOSIS — O26899 Other specified pregnancy related conditions, unspecified trimester: Secondary | ICD-10-CM | POA: Diagnosis not present

## 2017-01-19 DIAGNOSIS — R102 Pelvic and perineal pain: Secondary | ICD-10-CM | POA: Insufficient documentation

## 2017-01-19 DIAGNOSIS — F329 Major depressive disorder, single episode, unspecified: Secondary | ICD-10-CM | POA: Insufficient documentation

## 2017-01-19 LAB — URINALYSIS, ROUTINE W REFLEX MICROSCOPIC
Bilirubin Urine: NEGATIVE
Glucose, UA: NEGATIVE mg/dL
Hgb urine dipstick: NEGATIVE
Ketones, ur: 20 mg/dL — AB
Leukocytes, UA: NEGATIVE
Nitrite: NEGATIVE
PH: 6 (ref 5.0–8.0)
Protein, ur: 30 mg/dL — AB
SPECIFIC GRAVITY, URINE: 1.026 (ref 1.005–1.030)

## 2017-01-19 NOTE — MAU Note (Signed)
Patient c/o lower back and abdominal pain that started last night and woke her up. Denies vaginal bleeding or discharge. Has not taken anything for the pain.

## 2017-01-19 NOTE — MAU Provider Note (Signed)
History     CSN: [id number]  Arrival date and time: 01/19/17 1109   First Provider Initiated Contact with Patient 01/19/17 1214      Chief Complaint  Patient presents with  . Abdominal Pain  . Back Pain   Patient is a 26 year old G3 P2 at 7 weeks and 5 days by transvaginal ultrasound performed approximately 1 week ago. She reports at that time she was having some mild crampy abdominal pain. It resolved but came back this morning. She is also reporting some pain in the lower back. She has no vaginal discharge and reports no vaginal bleeding. She reports some mild nausea that's been chronic but nothing new or worsening. She does report urinary frequency but no urgency or dysuria. She reports she did not have any pain like this with her previous pregnancies. She reports it to be a 6 or 7 out of 10 it is achy and crampy pain.    OB History    Gravida Para Term Preterm AB Living   3 2 2     2    SAB TAB Ectopic Multiple Live Births           2      Past Medical History:  Diagnosis Date  . Depression   . Normal pregnancy 04/15/2012  . Placenta abruption, delivered, current hospitalization 04/15/2012  . Sickle cell trait (HCC)   . SVD (spontaneous vaginal delivery) 04/15/2012    Past Surgical History:  Procedure Laterality Date  . ORIF ANKLE FRACTURE Left 02/28/2016   Procedure: OPEN REDUCTION INTERNAL FIXATION (ORIF) LEFT ANKLE FRACTURE;  Surgeon: Samson FredericBrian Swinteck, MD;  Location: [facility] OR;  Service: Orthopedics;  Laterality: Left;    Family History  Problem Relation Age of Onset  . Other Neg Hx     Social History  Substance Use Topics  . Smoking status: Former Smoker    Packs/day: 0.25    Types: Cigarettes    Quit date: 02/01/2012  . Smokeless tobacco: Never Used  . Alcohol use No    Allergies:  Allergies  Allergen Reactions  . Latex Other (See Comments)    Gloves irritate eczema on hands, no problems elsewhere per patient    Prescriptions Prior to Admission  Medication  Sig Dispense Refill Last Dose  . docusate sodium (COLACE) 100 MG capsule Take 1 capsule (100 mg total) by mouth 2 (two) times daily. (Patient not taking: Reported on 01/11/2017) 60 capsule 3 Not Taking at Unknown time    Review of Systems  Constitutional: Negative for chills and fever.  HENT: Negative for congestion and rhinorrhea.   Respiratory: Negative for cough and shortness of breath.   Cardiovascular: Negative for chest pain and palpitations.  Gastrointestinal: Positive for abdominal pain and nausea. Negative for abdominal distention, constipation, diarrhea and vomiting.  Genitourinary: Positive for frequency. Negative for difficulty urinating, dysuria, enuresis and hematuria.  Musculoskeletal: Positive for back pain. Negative for myalgias.  Neurological: Negative for dizziness and weakness.   Physical Exam   Blood pressure 100/74, pulse 91, temperature 98.4 F (36.9 C), temperature source Oral, resp. rate 16, height 5\' 6"  (1.676 m), weight 147 lb 0.6 oz (66.7 kg), last menstrual period 11/26/2016, SpO2 100 %.  Physical Exam  Vitals reviewed. Constitutional: She is oriented to person, place, and time. She appears well-developed and well-nourished.  Cardiovascular: Normal rate and intact distal pulses.   Respiratory: Effort normal. No respiratory distress.  GI: Soft. She exhibits no distension. There is no tenderness. There is no rebound  and no guarding.  Genitourinary: Vagina normal.  Musculoskeletal: Normal range of motion. She exhibits no edema.  Neurological: She is alert and oriented to person, place, and time.  Skin: Skin is warm and dry.  Psychiatric: She has a normal mood and affect. Her behavior is normal.    MAU Course  Procedures  MDM In MA U patient underwent bedside ultrasound which revealed a IUP with fetal heart rate in the 140s.  Patient denies any vaginal bleeding and recently had gonorrhea and chlamydia as well as wet prep testing patient declines that  today.  Likely this is round ligament pain versus abdominal cramping from nausea.  Assessment and Plan  #1: Round ligament pain concerning signs of vaginal bleeding no significant abdominal tenderness IUP with viable pregnancy on bedside ultrasound. Okay to discharge home.  Ernestina Penna 01/19/2017, 12:27 PM

## 2017-01-19 NOTE — Discharge Instructions (Signed)
Abdominal Pain During Pregnancy °Belly (abdominal) pain is common during pregnancy. Most of the time, it is not a serious problem. Other times, it can be a sign that something is wrong with the pregnancy. Always tell your doctor if you have belly pain. °Follow these instructions at home: °Monitor your belly pain for any changes. The following actions may help you feel better: °· Do not have sex (intercourse) or put anything in your vagina until you feel better. °· Rest until your pain stops. °· Drink clear fluids if you feel sick to your stomach (nauseous). Do not eat solid food until you feel better. °· Only take medicine as told by your doctor. °· Keep all doctor visits as told. °Get help right away if: °· You are bleeding, leaking fluid, or pieces of tissue come out of your vagina. °· You have more pain or cramping. °· You keep throwing up (vomiting). °· You have pain when you pee (urinate) or have blood in your pee. °· You have a fever. °· You do not feel your baby moving as much. °· You feel very weak or feel like passing out. °· You have trouble breathing, with or without belly pain. °· You have a very bad headache and belly pain. °· You have fluid leaking from your vagina and belly pain. °· You keep having watery poop (diarrhea). °· Your belly pain does not go away after resting, or the pain gets worse. °This information is not intended to replace advice given to you by your health care provider. Make sure you discuss any questions you have with your health care provider. °Document Released: 08/19/2009 Document Revised: 04/08/2016 Document Reviewed: 03/30/2013 °Elsevier Interactive Patient Education © 2017 [employer] ° °

## 2017-02-05 ENCOUNTER — Encounter (HOSPITAL_COMMUNITY): Payer: Self-pay | Admitting: Student

## 2017-02-05 ENCOUNTER — Inpatient Hospital Stay (HOSPITAL_COMMUNITY)
Admission: AD | Admit: 2017-02-05 | Discharge: 2017-02-05 | Disposition: A | Discharge status: Home / Self Care | Payer: Medicaid Other | Source: Ambulatory Visit | Attending: Obstetrics & Gynecology | Admitting: Obstetrics & Gynecology

## 2017-02-05 DIAGNOSIS — D573 Sickle-cell trait: Secondary | ICD-10-CM | POA: Diagnosis not present

## 2017-02-05 DIAGNOSIS — Z3A10 10 weeks gestation of pregnancy: Secondary | ICD-10-CM | POA: Diagnosis not present

## 2017-02-05 DIAGNOSIS — O99011 Anemia complicating pregnancy, first trimester: Secondary | ICD-10-CM | POA: Diagnosis not present

## 2017-02-05 DIAGNOSIS — Z87891 Personal history of nicotine dependence: Secondary | ICD-10-CM | POA: Diagnosis not present

## 2017-02-05 DIAGNOSIS — Z91040 Latex allergy status: Secondary | ICD-10-CM | POA: Diagnosis not present

## 2017-02-05 DIAGNOSIS — O219 Vomiting of pregnancy, unspecified: Secondary | ICD-10-CM | POA: Diagnosis not present

## 2017-02-05 DIAGNOSIS — O26899 Other specified pregnancy related conditions, unspecified trimester: Secondary | ICD-10-CM

## 2017-02-05 DIAGNOSIS — Z3491 Encounter for supervision of normal pregnancy, unspecified, first trimester: Secondary | ICD-10-CM

## 2017-02-05 DIAGNOSIS — R109 Unspecified abdominal pain: Secondary | ICD-10-CM | POA: Diagnosis not present

## 2017-02-05 DIAGNOSIS — N898 Other specified noninflammatory disorders of vagina: Secondary | ICD-10-CM | POA: Diagnosis not present

## 2017-02-05 DIAGNOSIS — O26891 Other specified pregnancy related conditions, first trimester: Secondary | ICD-10-CM | POA: Diagnosis not present

## 2017-02-05 LAB — URINALYSIS, ROUTINE W REFLEX MICROSCOPIC
Bilirubin Urine: NEGATIVE
GLUCOSE, UA: NEGATIVE mg/dL
Hgb urine dipstick: NEGATIVE
KETONES UR: NEGATIVE mg/dL
LEUKOCYTES UA: NEGATIVE
NITRITE: NEGATIVE
PROTEIN: NEGATIVE mg/dL
Specific Gravity, Urine: 1.016 (ref 1.005–1.030)
pH: 6 (ref 5.0–8.0)

## 2017-02-05 LAB — WET PREP, GENITAL
CLUE CELLS WET PREP: NONE SEEN
Sperm: NONE SEEN
TRICH WET PREP: NONE SEEN
YEAST WET PREP: NONE SEEN

## 2017-02-05 MED ORDER — PROMETHAZINE HCL 25 MG PO TABS: 25.0000 mg | ORAL_TABLET | Freq: Four times a day (QID) | ORAL | 0 refills | Status: DC | PRN

## 2017-02-05 MED ORDER — ACETAMINOPHEN 500 MG PO TABS
500.0000 mg | ORAL_TABLET | Freq: Once | ORAL | Status: AC
  Administered 2017-02-05: 500 mg via ORAL
  Filled 2017-02-05: qty 1

## 2017-02-05 NOTE — Discharge Instructions (Signed)
Abdominal Pain During Pregnancy °Belly (abdominal) pain is common during pregnancy. Most of the time, it is not a serious problem. Other times, it can be a sign that something is wrong with the pregnancy. Always tell your doctor if you have belly pain. °Follow these instructions at home: °Monitor your belly pain for any changes. The following actions may help you feel better: °· Do not have sex (intercourse) or put anything in your vagina until you feel better. °· Rest until your pain stops. °· Drink clear fluids if you feel sick to your stomach (nauseous). Do not eat solid food until you feel better. °· Only take medicine as told by your doctor. °· Keep all doctor visits as told. °Get help right away if: °· You are bleeding, leaking fluid, or pieces of tissue come out of your vagina. °· You have more pain or cramping. °· You keep throwing up (vomiting). °· You have pain when you pee (urinate) or have blood in your pee. °· You have a fever. °· You do not feel your baby moving as much. °· You feel very weak or feel like passing out. °· You have trouble breathing, with or without belly pain. °· You have a very bad headache and belly pain. °· You have fluid leaking from your vagina and belly pain. °· You keep having watery poop (diarrhea). °· Your belly pain does not go away after resting, or the pain gets worse. °This information is not intended to replace advice given to you by your health care provider. Make sure you discuss any questions you have with your health care provider. °Document Released: 08/19/2009 Document Revised: 04/08/2016 Document Reviewed: 03/30/2013 °Elsevier Interactive Patient Education © 2017 [employer] ° °

## 2017-02-05 NOTE — MAU Provider Note (Signed)
History     CSN: [id number]  Arrival date and time: 02/05/17 0807   First Provider Initiated Contact with Patient 02/05/17 [phone]     Chief Complaint  Patient presents with  . Abdominal Pain   HPI Linda Barry is a 26 y.o. G3P2002 at [redacted]w[redacted]d who presents with abdominal cramping. Symptoms have continued throughout pregnancy but worsened last night. Describes intermittent lower abdominal cramping. Rates pain 6/10. Took 1 RS tylenol this morning without relief. Endorses some nausea; hasn't vomited since Wednesday. Mucoid vaginal discharge; no itching or irritation. Denies diarrhea, constipation, dysuria, fever, or vaginal bleeding. Last BM was this morning and normal for her. Waiting to hear from [facility] for initial prenatal appointment.   OB History    Gravida Para Term Preterm AB Living   3 2 2     2    SAB TAB Ectopic Multiple Live Births           2      Past Medical History:  Diagnosis Date  . Depression   . Placenta abruption, delivered, current hospitalization 04/15/2012  . Sickle cell trait (HCC)   . SVD (spontaneous vaginal delivery) 04/15/2012    Past Surgical History:  Procedure Laterality Date  . ORIF ANKLE FRACTURE Left 02/28/2016   Procedure: OPEN REDUCTION INTERNAL FIXATION (ORIF) LEFT ANKLE FRACTURE;  Surgeon: Samson Frederic, MD;  Location: [facility] OR;  Service: Orthopedics;  Laterality: Left;    Family History  Problem Relation Age of Onset  . Other Neg Hx     Social History  Substance Use Topics  . Smoking status: Former Smoker    Packs/day: 0.25    Types: Cigarettes    Quit date: 02/01/2012  . Smokeless tobacco: Never Used  . Alcohol use No    Allergies:  Allergies  Allergen Reactions  . Latex Other (See Comments)    Gloves irritate eczema on hands, no problems elsewhere per patient    Prescriptions Prior to Admission  Medication Sig Dispense Refill Last Dose  . docusate sodium (COLACE) 100 MG capsule Take 1 capsule (100 mg total) by mouth 2 (two) times  daily. (Patient not taking: Reported on 01/11/2017) 60 capsule 3 Not Taking at Unknown time    Review of Systems  Constitutional: Negative.   Gastrointestinal: Positive for abdominal pain and nausea. Negative for constipation, diarrhea and vomiting.  Genitourinary: Positive for vaginal discharge. Negative for dysuria and vaginal bleeding.   Physical Exam   Blood pressure 98/64, pulse 73, temperature 98.5 F (36.9 C), temperature source Oral, resp. rate 17, weight 148 lb 1.9 oz (67.2 kg), last menstrual period 11/26/2016, SpO2 100 %.  Physical Exam  Nursing note and vitals reviewed. Constitutional: She is oriented to person, place, and time. She appears well-developed and well-nourished. No distress.  HENT:  Head: Normocephalic and atraumatic.  Eyes: Conjunctivae are normal. Right eye exhibits no discharge. Left eye exhibits no discharge. No scleral icterus.  Neck: Normal range of motion.  Cardiovascular: Normal rate, regular rhythm and normal heart sounds.   No murmur heard. Respiratory: Effort normal and breath sounds normal. No respiratory distress. She has no wheezes.  GI: Soft. Bowel sounds are normal. She exhibits no distension. There is no tenderness. There is no rebound and no guarding.  Genitourinary: Uterus is enlarged. Uterus is not tender. Cervix exhibits no motion tenderness.  Genitourinary Comments: Cervix closed  Neurological: She is alert and oriented to person, place, and time.  Skin: Skin is warm and dry. She is not diaphoretic.  Psychiatric: She has a normal mood and affect. Her behavior is normal. Judgment and thought content normal.    MAU Course  Procedures Results for orders placed or performed during the hospital encounter of 02/05/17 (from the past 24 hour(s))  Urinalysis, Routine w reflex microscopic     Status: Abnormal   Collection Time: 02/05/17  8:32 AM  Result Value Ref Range   Color, Urine STRAW (A) YELLOW   APPearance CLEAR CLEAR   Specific  Gravity, Urine 1.016 1.005 - 1.030   pH 6.0 5.0 - 8.0   Glucose, UA NEGATIVE NEGATIVE mg/dL   Hgb urine dipstick NEGATIVE NEGATIVE   Bilirubin Urine NEGATIVE NEGATIVE   Ketones, ur NEGATIVE NEGATIVE mg/dL   Protein, ur NEGATIVE NEGATIVE mg/dL   Nitrite NEGATIVE NEGATIVE   Leukocytes, UA NEGATIVE NEGATIVE  Wet prep, genital     Status: Abnormal   Collection Time: 02/05/17  8:53 AM  Result Value Ref Range   Yeast Wet Prep HPF POC NONE SEEN NONE SEEN   Trich, Wet Prep NONE SEEN NONE SEEN   Clue Cells Wet Prep HPF POC NONE SEEN NONE SEEN   WBC, Wet Prep HPF POC FEW (A) NONE SEEN   Sperm NONE SEEN     MDM FHT 161 GC/CT & wet prep per patient request U/a Cervix closed Tylenol 500 mg PO  Assessment and Plan  A: 1. Abdominal cramping affecting pregnancy   2. Fetal heart tones present, first trimester   3. Nausea and vomiting during pregnancy prior to [redacted] weeks gestation   4. Vaginal discharge during pregnancy in first trimester    P: Discharge home Rx phenergan Discussed reasons to return to MAU Schedule prenatal care with [facility]  GC/CT pending   Linda Barry 02/05/2017, 8:44 AM

## 2017-02-05 NOTE — MAU Note (Addendum)
+  abdominal pain--tried tylenol and it eased it off a bit 10 to now a 6/10 States has been seen a couple times and expresses that she wants to make sure everything is ok with the baby and that it has a heartbeat. Still [facility].  Denies Vaginal bleeding . +mucous like vaginal discharge

## 2017-02-09 LAB — GC/CHLAMYDIA PROBE AMP (~~LOC~~) NOT AT ARMC
Chlamydia: NEGATIVE
NEISSERIA GONORRHEA: NEGATIVE

## 2017-03-03 ENCOUNTER — Encounter: Payer: Medicaid Other | Admitting: Obstetrics and Gynecology

## 2017-03-15 LAB — OB RESULTS CONSOLE HIV ANTIBODY (ROUTINE TESTING): HIV: NONREACTIVE

## 2017-06-08 LAB — OB RESULTS CONSOLE RPR: RPR: NONREACTIVE

## 2017-06-08 LAB — OB RESULTS CONSOLE HEPATITIS B SURFACE ANTIGEN: Hepatitis B Surface Ag: NEGATIVE

## 2017-06-28 IMAGING — DX DG ANKLE COMPLETE 3+V*L*
2 series · 2 of 2 positions shown · non-contrast
Comparison: 03/20/2012

CLINICAL DATA: Fall off porch this morning.  Ankle deformity.

EXAM:
LEFT ANKLE COMPLETE - 3+ VIEW

[x ankle ap left (1 of 2)]
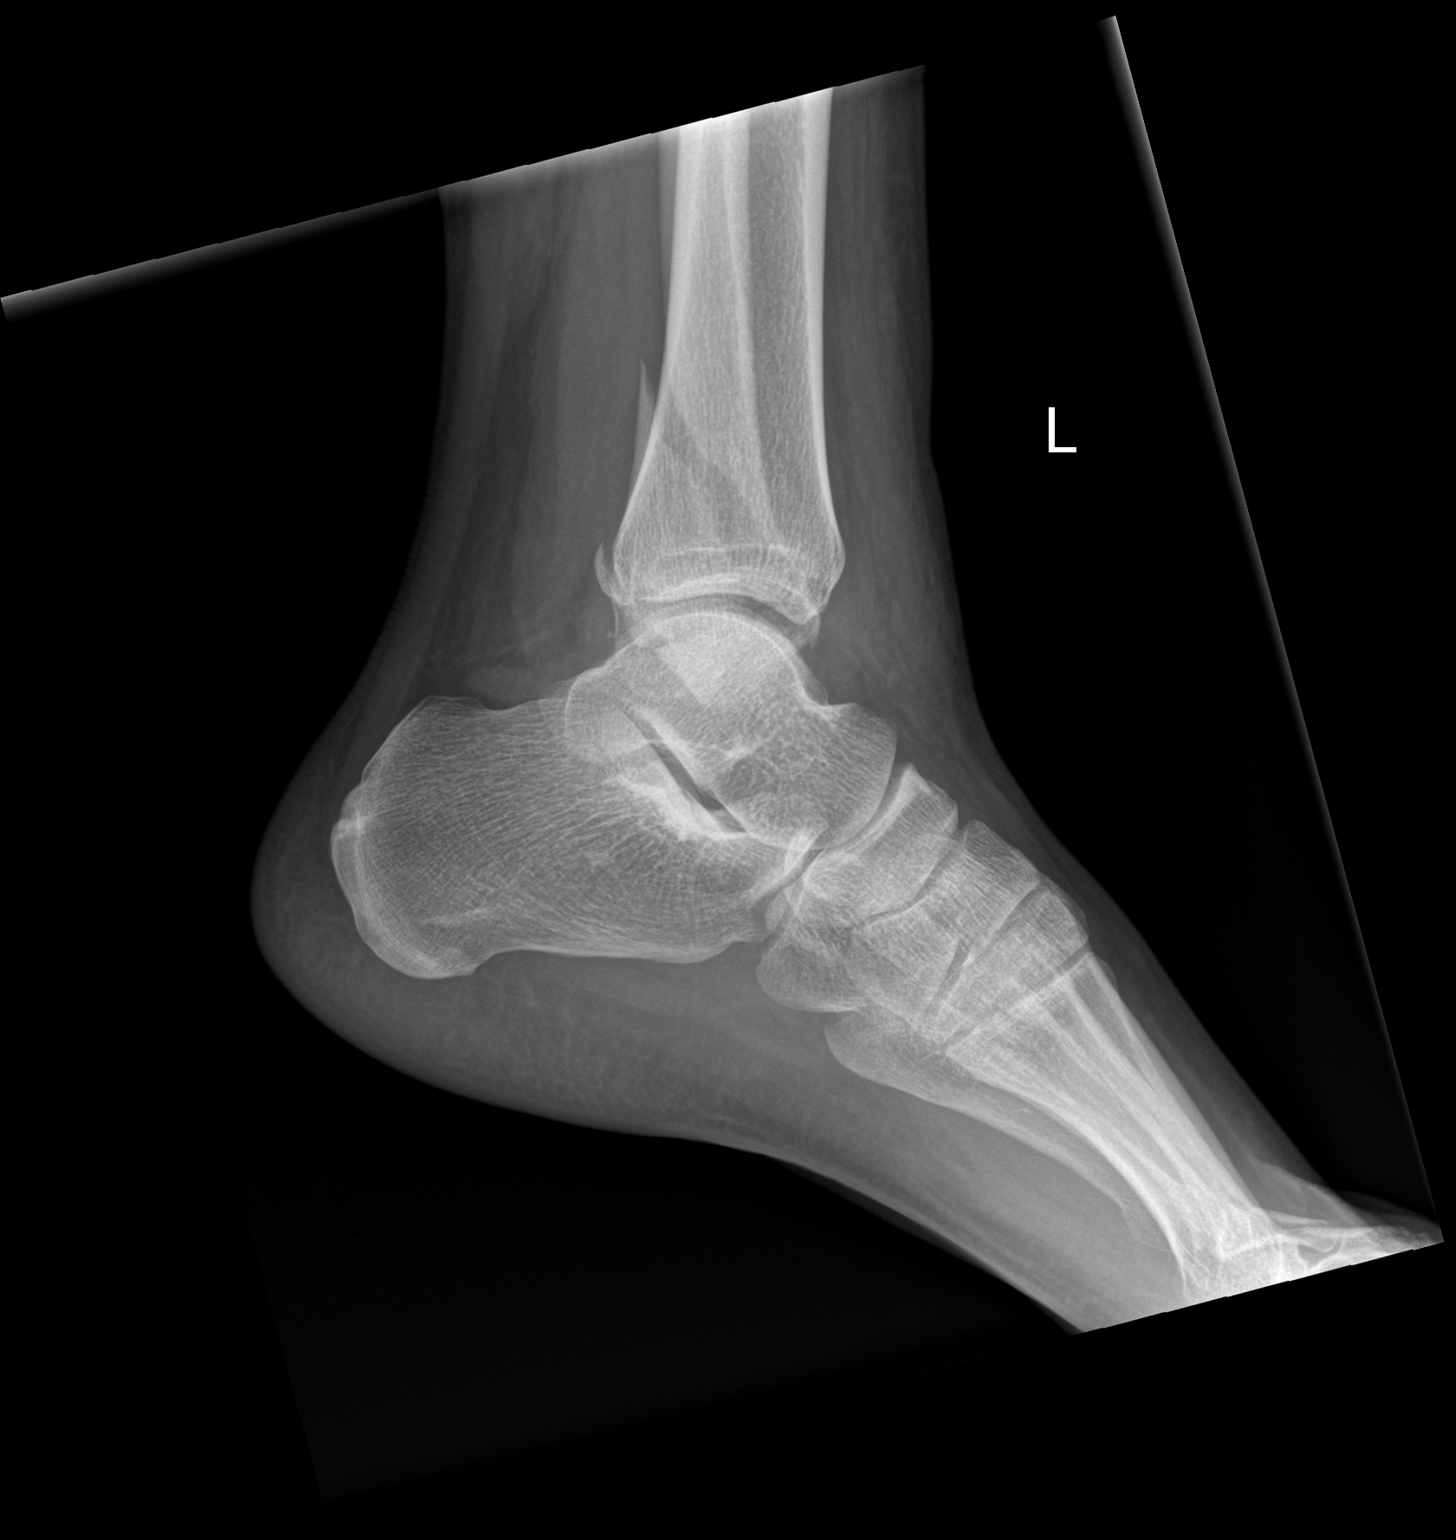

[x ankle ap left (2 of 2)]
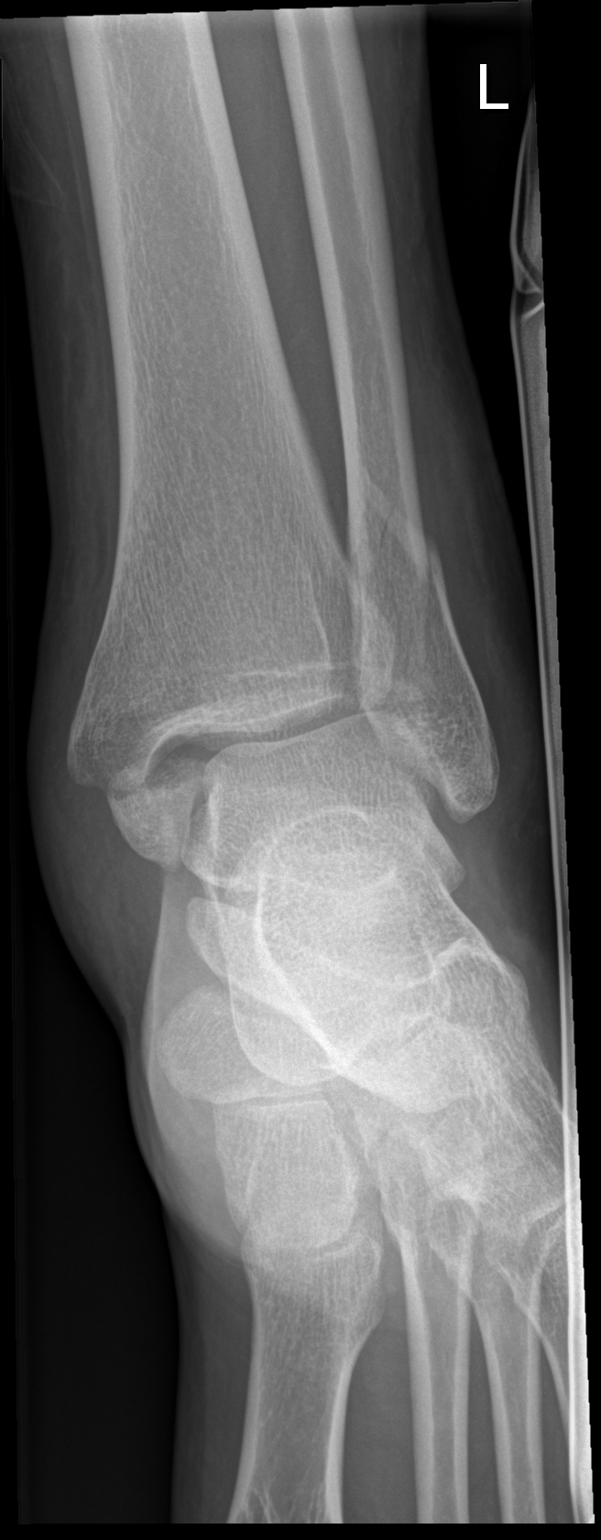

[2 of 2 positions shown; findings below may reference images not displayed]

FINDINGS: There are fractures through the distal fibula, medial malleolus, and
posterior tibia compatible with trimalleolar fracture. Widening of
the ankle mortise medially. Diffuse soft tissue swelling.
IMPRESSION: Trimalleolar fracture with disruption of the ankle mortise.

## 2017-06-28 IMAGING — CR DG ANKLE PORT 2V*L*
3 series · 3 of 3 positions shown · non-contrast
Comparison: Prior today

CLINICAL DATA: Left ankle fracture-dislocation. Status
postreduction. Initial encounter.

EXAM:
PORTABLE LEFT ANKLE - 2 VIEW

[AP (1 of 2)]
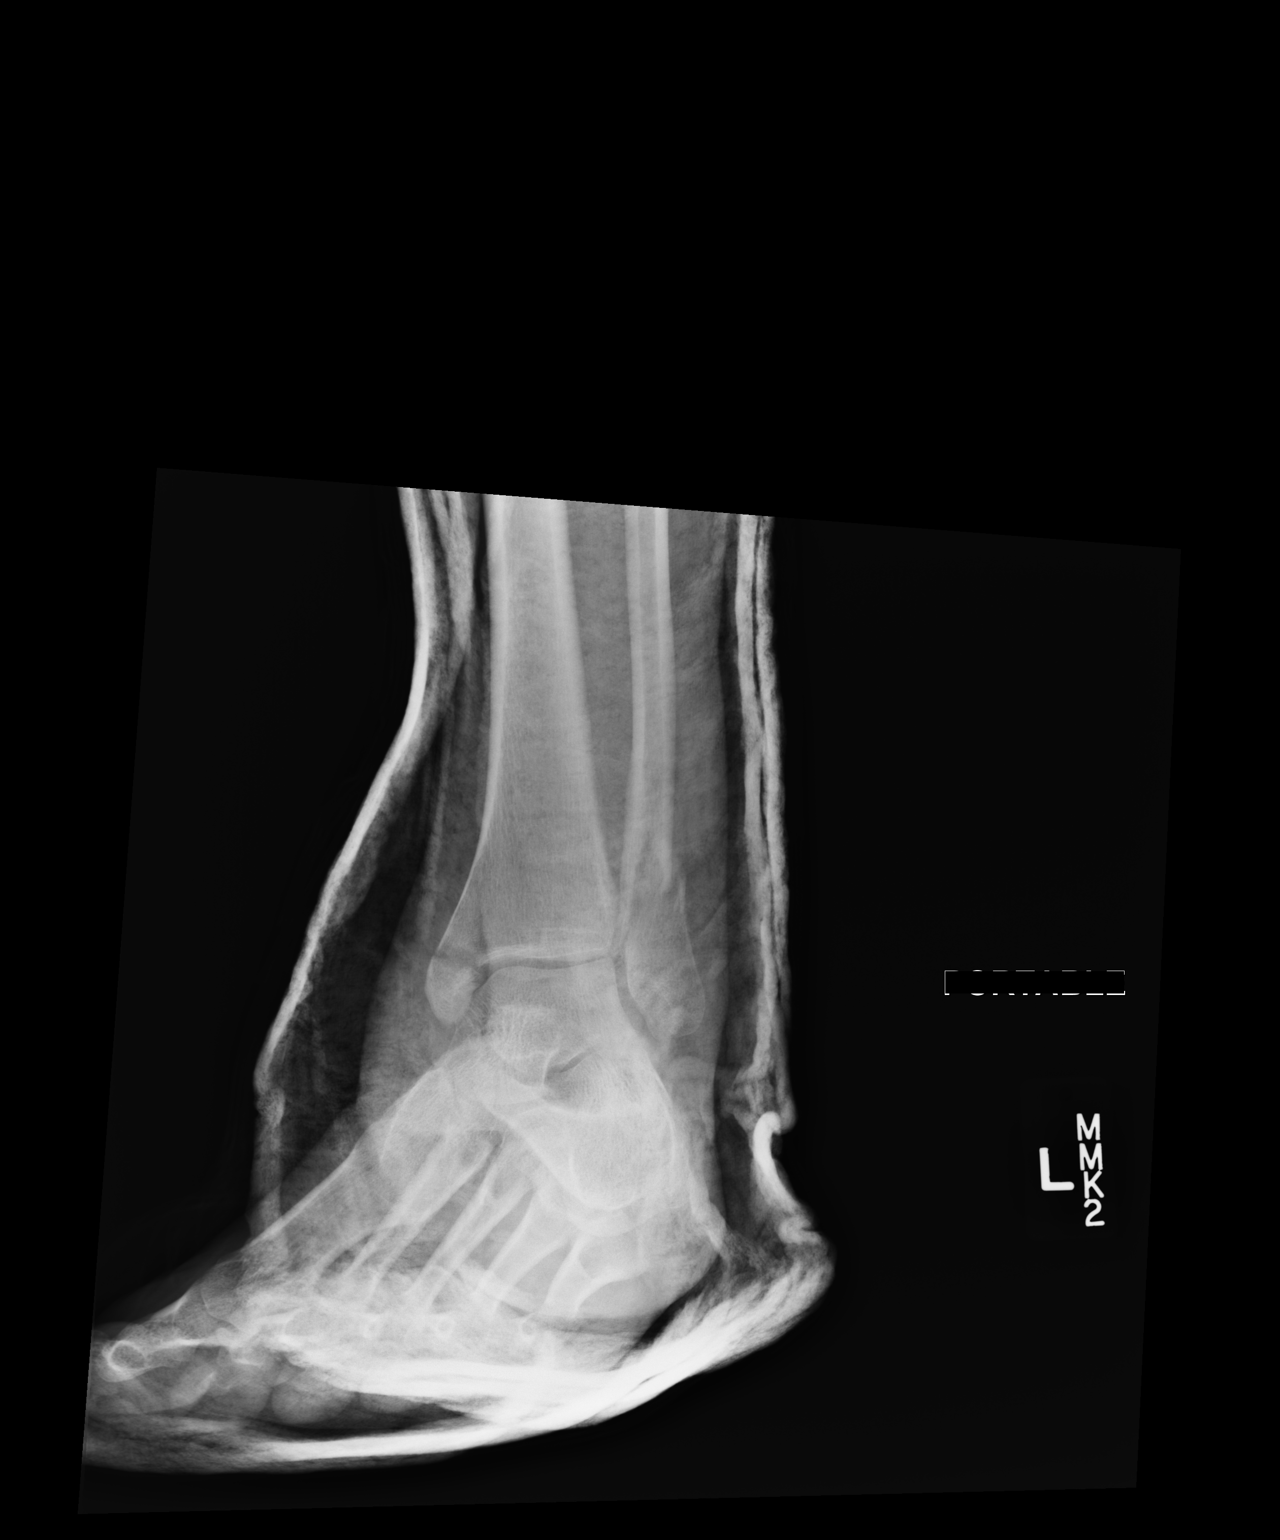

[lateral]
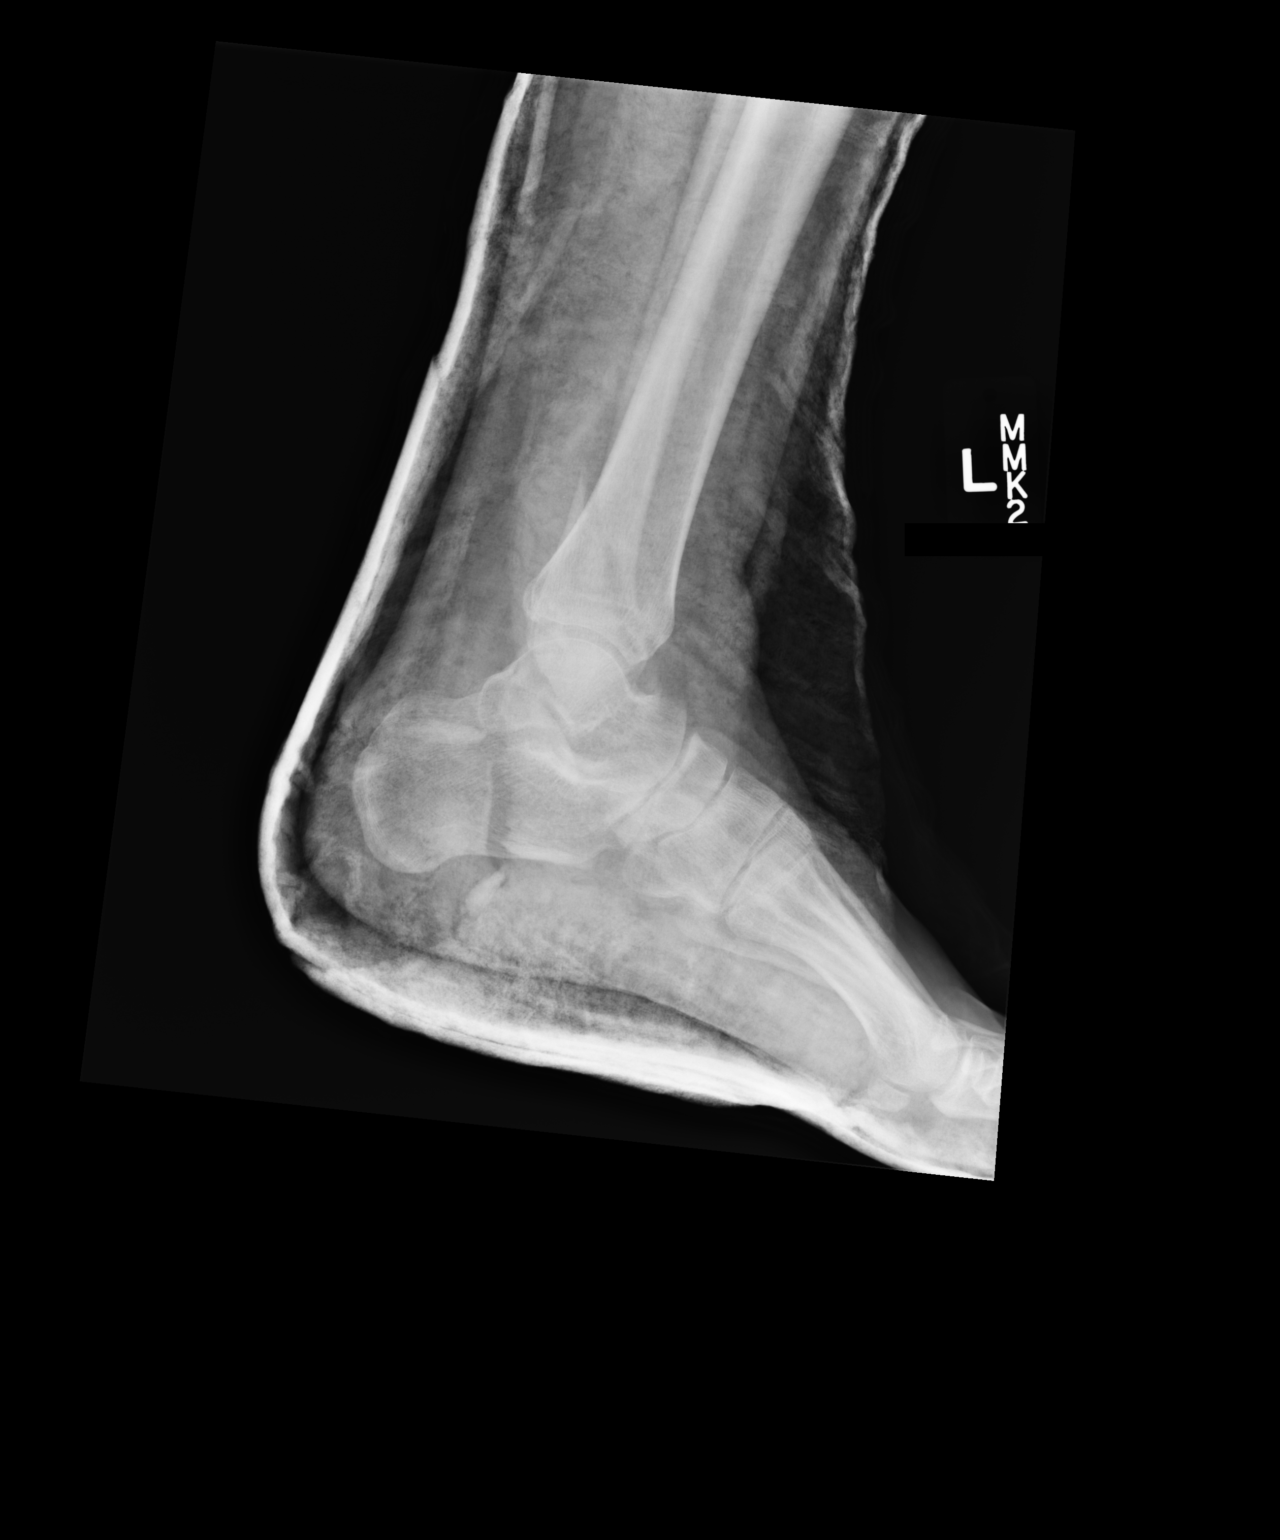

[AP (2 of 2)]
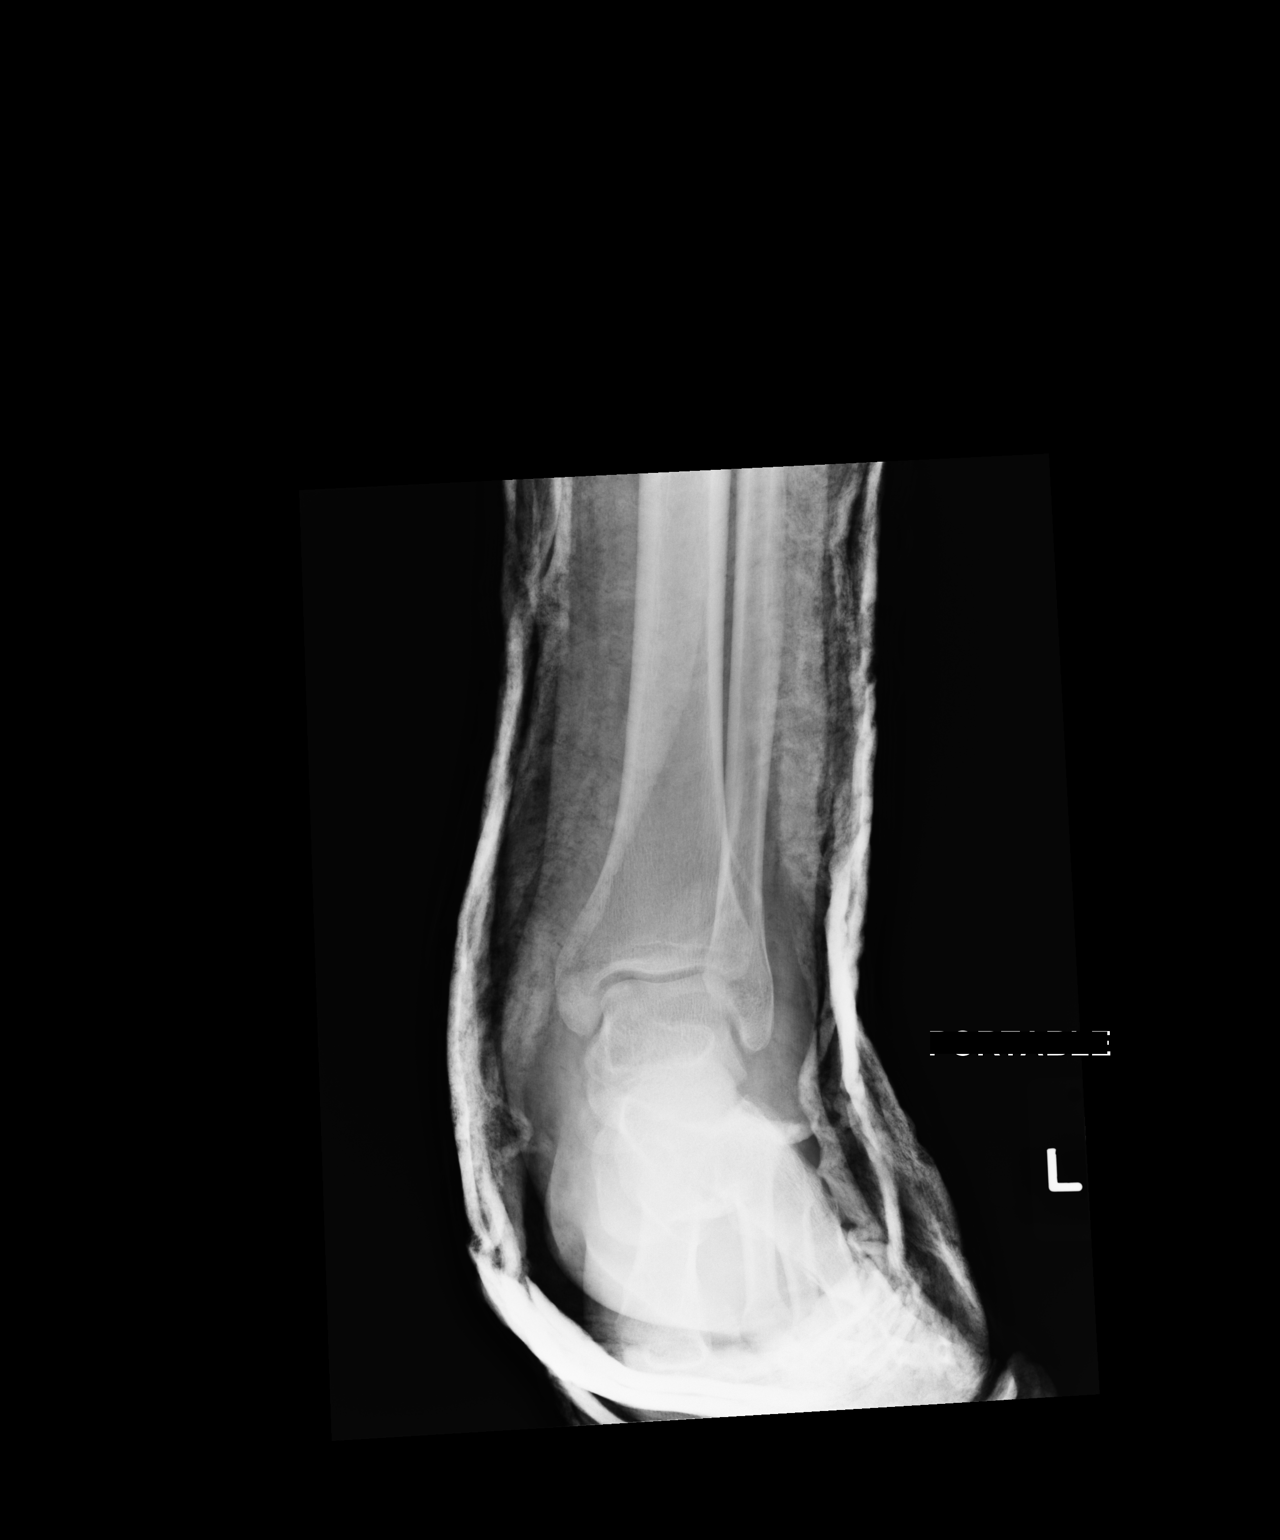

[3 of 3 positions shown; findings below may reference images not displayed]

FINDINGS: Trimalleolar ankle fracture is now in near anatomic alignment. The
talus is now centered within the ankle mortise. A cast has been
applied.
IMPRESSION: Successful reduction of trimalleolar ankle fracture, now in near
anatomic alignment.

## 2017-07-11 IMAGING — RF DG C-ARM 61-120 MIN
1 series · 4 of 4 positions shown · non-contrast
Comparison: Previous ankle films from 02/15/16.

CLINICAL DATA: Pain, fell

EXAM:
DG C-ARM 61-120 MIN; LEFT ANKLE COMPLETE - 3+ VIEW

[Series 1: run · 4 of 4 slices shown]
[im 1/4]
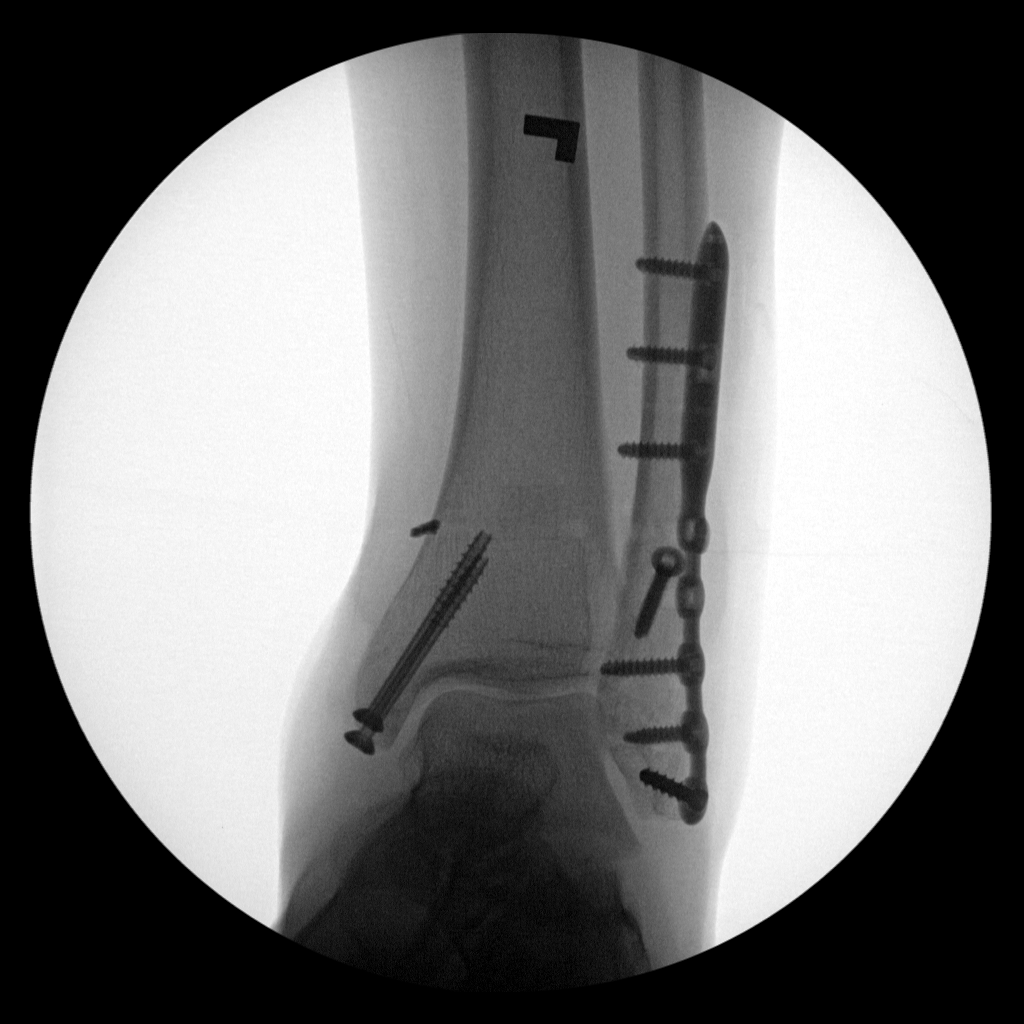
[im 2/4]
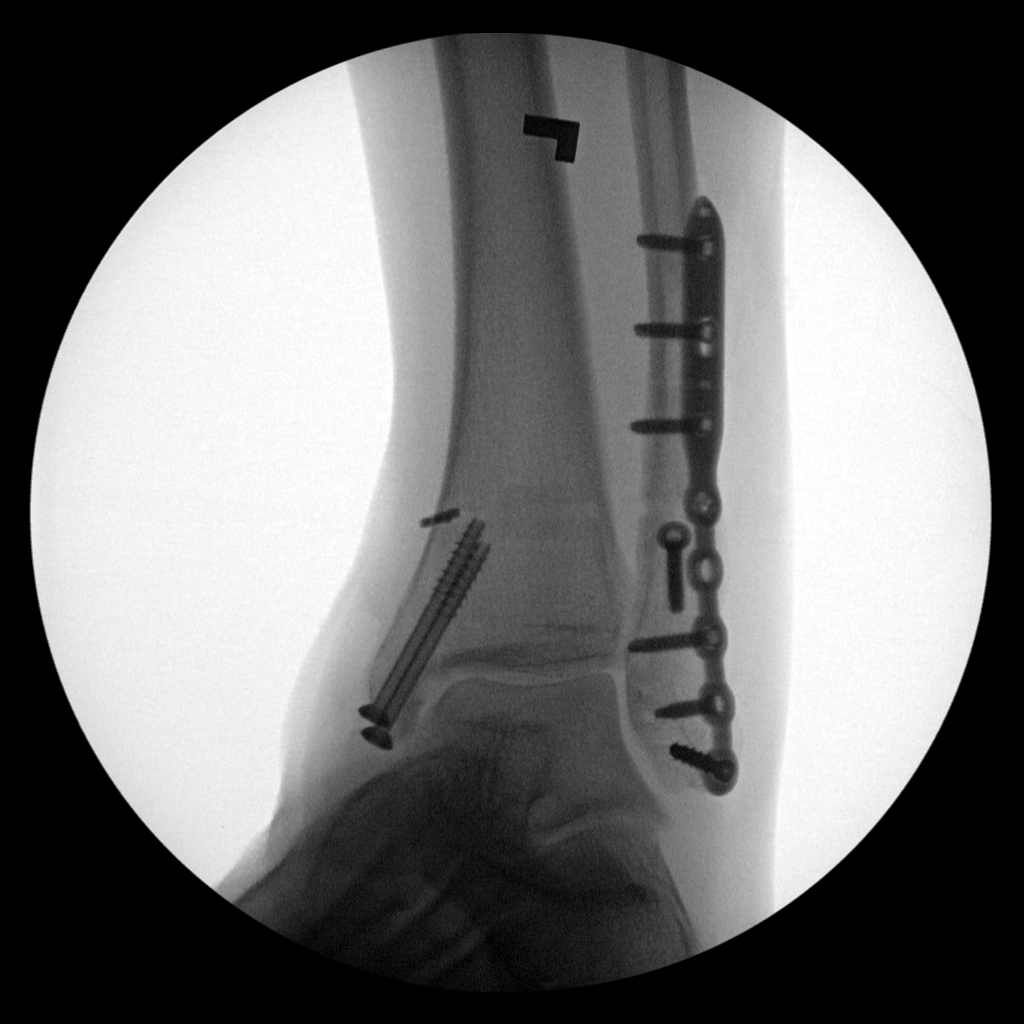
[im 3/4]
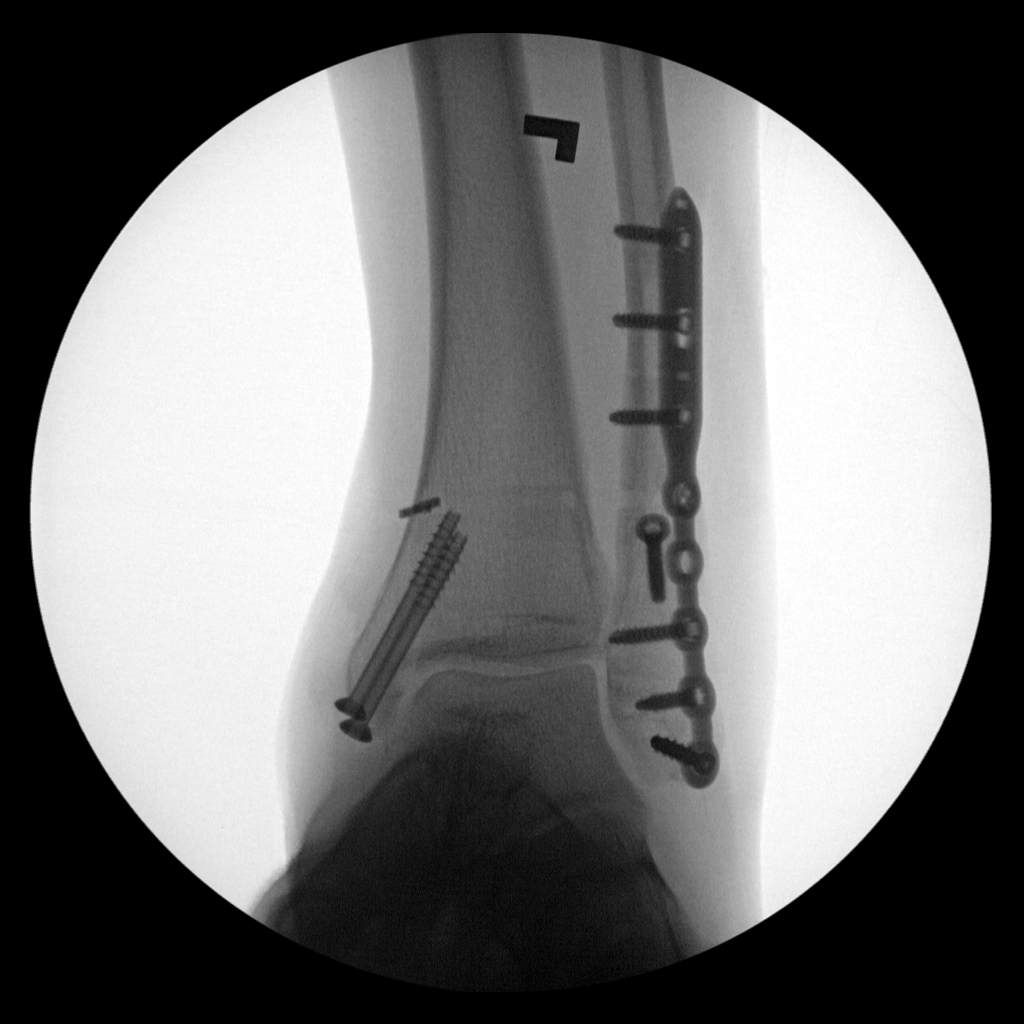
[im 4/4]
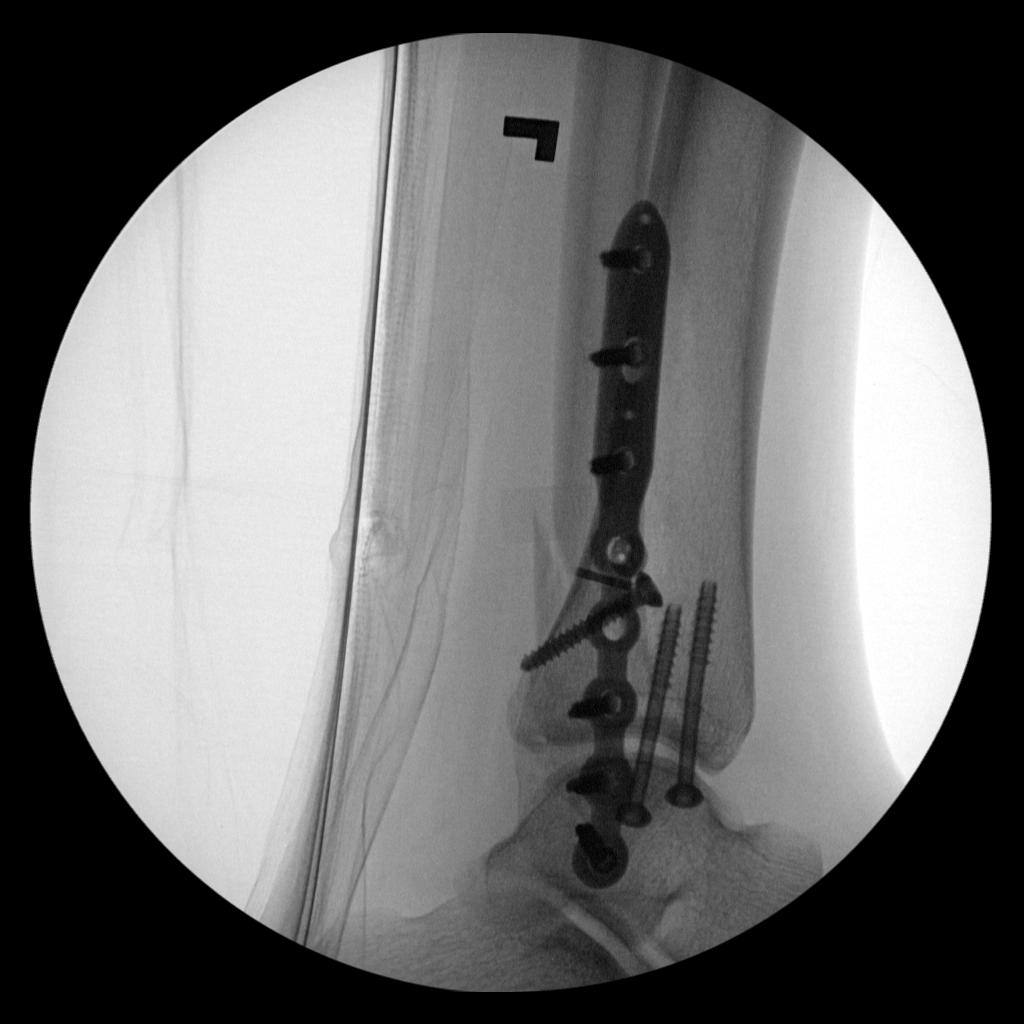

[4 of 4 positions shown; findings below may reference images not displayed]

FINDINGS: Open reduction internal fixation of a trimalleolar fracture with
plate and screw fixation.
IMPRESSION: Satisfactory position and alignment.

## 2017-07-12 ENCOUNTER — Encounter (HOSPITAL_COMMUNITY): Payer: Self-pay | Admitting: *Deleted

## 2017-07-12 ENCOUNTER — Inpatient Hospital Stay (HOSPITAL_COMMUNITY)
Admission: AD | Admit: 2017-07-12 | Discharge: 2017-07-12 | Disposition: A | Discharge status: Home / Self Care | Payer: Medicaid Other | Source: Ambulatory Visit | Attending: Obstetrics and Gynecology | Admitting: Obstetrics and Gynecology

## 2017-07-12 DIAGNOSIS — N9489 Other specified conditions associated with female genital organs and menstrual cycle: Secondary | ICD-10-CM | POA: Diagnosis not present

## 2017-07-12 DIAGNOSIS — O26893 Other specified pregnancy related conditions, third trimester: Secondary | ICD-10-CM | POA: Diagnosis not present

## 2017-07-12 DIAGNOSIS — O9989 Other specified diseases and conditions complicating pregnancy, childbirth and the puerperium: Secondary | ICD-10-CM

## 2017-07-12 DIAGNOSIS — D573 Sickle-cell trait: Secondary | ICD-10-CM | POA: Insufficient documentation

## 2017-07-12 DIAGNOSIS — Z91040 Latex allergy status: Secondary | ICD-10-CM | POA: Insufficient documentation

## 2017-07-12 DIAGNOSIS — N909 Noninflammatory disorder of vulva and perineum, unspecified: Secondary | ICD-10-CM | POA: Diagnosis not present

## 2017-07-12 DIAGNOSIS — R102 Pelvic and perineal pain: Secondary | ICD-10-CM | POA: Diagnosis present

## 2017-07-12 DIAGNOSIS — Z87891 Personal history of nicotine dependence: Secondary | ICD-10-CM | POA: Diagnosis not present

## 2017-07-12 DIAGNOSIS — Z3A32 32 weeks gestation of pregnancy: Secondary | ICD-10-CM | POA: Insufficient documentation

## 2017-07-12 DIAGNOSIS — O99013 Anemia complicating pregnancy, third trimester: Secondary | ICD-10-CM | POA: Insufficient documentation

## 2017-07-12 LAB — URINALYSIS, ROUTINE W REFLEX MICROSCOPIC
Bilirubin Urine: NEGATIVE
Glucose, UA: NEGATIVE mg/dL
Hgb urine dipstick: NEGATIVE
KETONES UR: NEGATIVE mg/dL
LEUKOCYTES UA: NEGATIVE
NITRITE: NEGATIVE
PROTEIN: NEGATIVE mg/dL
Specific Gravity, Urine: 1.009 (ref 1.005–1.030)
pH: 6 (ref 5.0–8.0)

## 2017-07-12 NOTE — Discharge Instructions (Signed)
Eating Plan for Pregnant Women While you are pregnant, your body will require additional nutrition to help support your growing baby. It is recommended that you consume:  150 additional calories each day during your first trimester.  300 additional calories each day during your second trimester.  300 additional calories each day during your third trimester.  Eating a healthy, well-balanced diet is very important for your health and for your baby's health. You also have a higher need for some vitamins and minerals, such as folic acid, calcium, iron, and vitamin D. What do I need to know about eating during pregnancy?  Do not try to lose weight or go on a diet during pregnancy.  Choose healthy, nutritious foods. Choose  of a sandwich with a glass of milk instead of a candy bar or a high-calorie sugar-sweetened beverage.  Limit your overall intake of foods that have "empty calories." These are foods that have little nutritional value, such as sweets, desserts, candies, sugar-sweetened beverages, and fried foods.  Eat a variety of foods, especially fruits and vegetables.  Take a prenatal vitamin to help meet the additional needs during pregnancy, specifically for folic acid, iron, calcium, and vitamin D.  Remember to stay active. Ask your health care provider for exercise recommendations that are specific to you.  Practice good food safety and cleanliness, such as washing your hands before you eat and after you prepare raw meat. This helps to prevent foodborne illnesses, such as listeriosis, that can be very dangerous for your baby. Ask your health care provider for more information about listeriosis. What does 150 extra calories look like? Healthy options for an additional 150 calories each day could be any of the following:  Plain low-fat yogurt (6-8 oz) with  cup of berries.  1 apple with 2 teaspoons of peanut butter.  Cut-up vegetables with  cup of hummus.  Low-fat chocolate  milk (8 oz or 1 cup).  1 string cheese with 1 medium orange.   of a peanut butter and jelly sandwich on whole-wheat bread (1 tsp of peanut butter).  For 300 calories, you could eat two of those healthy options each day. What is a healthy amount of weight to gain? The recommended amount of weight for you to gain is based on your pre-pregnancy BMI. If your pre-pregnancy BMI was:  Less than 18 (underweight), you should gain 28-40 lb.  18-24.9 (normal), you should gain 25-35 lb.  25-29.9 (overweight), you should gain 15-25 lb.  Greater than 30 (obese), you should gain 11-20 lb.  What if I am having twins or multiples? Generally, pregnant women who will be having twins or multiples may need to increase their daily calories by 300-600 calories each day. The recommended range for total weight gain is 25-54 lb, depending on your pre-pregnancy BMI. Talk with your health care provider for specific guidance about additional nutritional needs, weight gain, and exercise during your pregnancy. What foods can I eat? Grains Any grains. Try to choose whole grains, such as whole-wheat bread, oatmeal, or brown rice. Vegetables Any vegetables. Try to eat a variety of colors and types of vegetables to get a full range of vitamins and minerals. Remember to wash your vegetables well before eating. Fruits Any fruits. Try to eat a variety of colors and types of fruit to get a full range of vitamins and minerals. Remember to wash your fruits well before eating. Meats and Other Protein Sources Lean meats, including chicken, turkey, fish, and lean cuts of beef, veal,   or pork. Make sure that all meats are cooked to "well done." Tofu. Tempeh. Beans. Eggs. Peanut butter and other nut butters. Seafood, such as shrimp, crab, and lobster. If you choose fish, select types that are higher in omega-3 fatty acids, including salmon, herring, mussels, trout, sardines, and pollock. Make sure that all meats are cooked to  food-safe temperatures. Dairy Pasteurized milk and milk alternatives. Pasteurized yogurt and pasteurized cheese. Cottage cheese. Sour cream. Beverages Water. Juices that contain 100% fruit juice or vegetable juice. Caffeine-free teas and decaffeinated coffee. Drinks that contain caffeine are okay to drink, but it is better to avoid caffeine. Keep your total caffeine intake to less than 200 mg each day (12 oz of coffee, tea, or soda) or as directed by your health care provider. Condiments Any pasteurized condiments. Sweets and Desserts Any sweets and desserts. Fats and Oils Any fats and oils. The items listed above may not be a complete list of recommended foods or beverages. Contact your dietitian for more options. What foods are not recommended? Vegetables Unpasteurized (raw) vegetable juices. Fruits Unpasteurized (raw) fruit juices. Meats and Other Protein Sources Cured meats that have nitrates, such as bacon, salami, and hotdogs. Luncheon meats, bologna, or other deli meats (unless they are reheated until they are steaming hot). Refrigerated pate, meat spreads from a meat counter, smoked seafood that is found in the refrigerated section of a store. Raw fish, such as sushi or sashimi. High mercury content fish, such as tilefish, shark, swordfish, and king mackerel. Raw meats, such as tuna or beef tartare. Undercooked meats and poultry. Make sure that all meats are cooked to food-safe temperatures. Dairy Unpasteurized (raw) milk and any foods that have raw milk in them. Soft cheeses, such as feta, queso blanco, queso fresco, Brie, Camembert cheeses, blue-veined cheeses, and Panela cheese (unless it is made with pasteurized milk, which must be stated on the label). Beverages Alcohol. Sugar-sweetened beverages, such as sodas, teas, or energy drinks. Condiments Homemade fermented foods and drinks, such as pickles, sauerkraut, or kombucha drinks. (Store-bought pasteurized versions of these are  okay.) Other Salads that are made in the store, such as ham salad, chicken salad, egg salad, tuna salad, and seafood salad. The items listed above may not be a complete list of foods and beverages to avoid. Contact your dietitian for more information. This information is not intended to replace advice given to you by your health care provider. Make sure you discuss any questions you have with your health care provider. Document Released: 06/15/2014 Document Revised: 02/06/2016 Document Reviewed: 02/13/2014 Elsevier Interactive Patient Education  2018 [employer]   

## 2017-07-12 NOTE — MAU Provider Note (Signed)
Pt came in for swollen labia, NST Category 1

## 2017-07-12 NOTE — MAU Provider Note (Signed)
Patient Linda Barry is a 26 y.o. G3P2002 At [id number] here with complaints of swollen vagina and "not feeing right". She denies bleeding, abnormal discharge, decreased fetal movements. She also has occasional nausea in the evenings. Today she ate chili and Wendys.   She denies dysuria; denies itching, burning or blistering on her labia.  History     CSN: [id number]  Arrival date and time: 07/12/17 1919   First Provider Initiated Contact with Patient 07/12/17 2046      Chief Complaint  Patient presents with  . Groin Swelling   Vaginal Pain  The patient's pertinent negatives include no genital itching, genital lesions or genital odor. This is a new problem. The current episode started today. The problem occurs constantly. The problem has been unchanged. The patient is experiencing no pain. Pertinent negatives include no constipation, diarrhea, headaches, hematuria, sore throat, urgency or vomiting.    OB History    Gravida Para Term Preterm AB Living   3 2 2     2    SAB TAB Ectopic Multiple Live Births           2      Past Medical History:  Diagnosis Date  . Depression   . Placenta abruption, delivered, current hospitalization 04/15/2012  . Sickle cell trait (HCC)   . SVD (spontaneous vaginal delivery) 04/15/2012    Past Surgical History:  Procedure Laterality Date  . ORIF ANKLE FRACTURE Left 02/28/2016   Procedure: OPEN REDUCTION INTERNAL FIXATION (ORIF) LEFT ANKLE FRACTURE;  Surgeon: Samson FredericBrian Swinteck, MD;  Location: [facility] OR;  Service: Orthopedics;  Laterality: Left;    Family History  Problem Relation Age of Onset  . Other Neg Hx     Social History  Substance Use Topics  . Smoking status: Former Smoker    Packs/day: 0.25    Types: Cigarettes    Quit date: 02/01/2012  . Smokeless tobacco: Never Used  . Alcohol use No    Allergies:  Allergies  Allergen Reactions  . Latex Other (See Comments)    Gloves irritate eczema on hands, no problems elsewhere per patient     Prescriptions Prior to Admission  Medication Sig Dispense Refill Last Dose  . acetaminophen (TYLENOL) 500 MG tablet Take 500 mg by mouth every 6 (six) hours as needed.   07/12/2017 at Unknown time  . hydrocortisone cream 1 % Apply 1 application topically 2 (two) times daily.   02/04/2017 at Unknown time  . promethazine (PHENERGAN) 25 MG tablet Take 1 tablet (25 mg total) by mouth every 6 (six) hours as needed for nausea or vomiting. 30 tablet 0 More than a month at Unknown time    Review of Systems  Constitutional: Negative.   HENT: Negative.  Negative for sore throat.   Respiratory: Negative.   Cardiovascular: Negative.   Gastrointestinal: Negative.  Negative for constipation, diarrhea and vomiting.  Genitourinary: Positive for vaginal pain. Negative for hematuria and urgency.  Musculoskeletal: Negative.   Neurological: Negative.  Negative for headaches.  Psychiatric/Behavioral: Negative.    Physical Exam   Blood pressure 113/64, pulse 92, temperature 98.4 F (36.9 C), temperature source Oral, resp. rate 16, height 5\' 6"  (1.676 m), weight 186 lb (84.4 kg), last menstrual period 11/26/2016, SpO2 99 %.  Physical Exam  Constitutional: She appears well-developed and well-nourished.  HENT:  Head: Normocephalic.  Neck: Normal range of motion.  Respiratory: Effort normal.  GI: Soft.  Genitourinary:  Genitourinary Comments: NEFG; labia are slightly edematous and ruggaeted but no signs  of lesions or blistering.  Labia are non-tender.   Musculoskeletal: Normal range of motion.  Neurological: She is alert.  Skin: Skin is warm and dry.  Psychiatric: She has a normal mood and affect.    MAU Course  Procedures  MDM -visual inspection of labia show no blistering, lesions or other signs of herpes or infection.  -cervical exam deferred -NST: 140 bpm, present acel, neg decels, mod var, no ctx.  Assessment and Plan   1. Labial swelling    2. Patient is stable for discharge with  reassurance that labia can develop varicosities and swelling towards the end of pregnancy.   3. Recommended that patient incorporate more fruits and vegetables into her diet and decrease the fatty foods; this will help with her nausea and heartburn.   4. Patient stable for discharge with recommendation to keep follow-up appt next week.  5. Plan of care discussed with Dr. Senaida Ores, who agrees.   Charlesetta Garibaldi Djibril Glogowski 07/12/2017, 9:09 PM

## 2017-07-12 NOTE — MAU Note (Signed)
Pt reports vaginal swelling. States she's noticed for several months but today after taking a shower she said it "looked lumpy." Wants to have it checked out. Pt denies vaginal discharge, irritation or itching. Pt denies vaginal bleeding. Pt reports some occasional braxton hicks contractions. Reports good fetal movement. Pt denies urinary s/s.

## 2017-07-12 NOTE — MAU Note (Signed)
Pt reports swelling for vaginal swelling for a few weeks. But tonight, after her bath, she noticed increase in swelling. And she didn't "like how it looked"

## 2017-08-09 LAB — OB RESULTS CONSOLE GC/CHLAMYDIA
Chlamydia: NEGATIVE
Gonorrhea: NEGATIVE

## 2017-08-09 LAB — OB RESULTS CONSOLE RUBELLA ANTIBODY, IGM: RUBELLA: IMMUNE

## 2017-08-09 LAB — OB RESULTS CONSOLE GBS: STREP GROUP B AG: POSITIVE

## 2017-08-26 ENCOUNTER — Inpatient Hospital Stay (HOSPITAL_COMMUNITY)
Admission: AD | Admit: 2017-08-26 | Discharge: 2017-08-28 | DRG: 807 | Disposition: A | Discharge status: Home / Self Care | Payer: Medicaid Other | Source: Ambulatory Visit | Attending: Obstetrics and Gynecology | Admitting: Obstetrics and Gynecology

## 2017-08-26 ENCOUNTER — Other Ambulatory Visit: Payer: Self-pay

## 2017-08-26 ENCOUNTER — Inpatient Hospital Stay (HOSPITAL_COMMUNITY): Payer: Medicaid Other | Admitting: Anesthesiology

## 2017-08-26 ENCOUNTER — Encounter (HOSPITAL_COMMUNITY): Payer: Self-pay | Admitting: *Deleted

## 2017-08-26 DIAGNOSIS — O9902 Anemia complicating childbirth: Secondary | ICD-10-CM | POA: Diagnosis present

## 2017-08-26 DIAGNOSIS — Z3A39 39 weeks gestation of pregnancy: Secondary | ICD-10-CM | POA: Diagnosis not present

## 2017-08-26 DIAGNOSIS — Z3483 Encounter for supervision of other normal pregnancy, third trimester: Secondary | ICD-10-CM | POA: Diagnosis present

## 2017-08-26 DIAGNOSIS — O99824 Streptococcus B carrier state complicating childbirth: Secondary | ICD-10-CM | POA: Diagnosis present

## 2017-08-26 DIAGNOSIS — D573 Sickle-cell trait: Secondary | ICD-10-CM | POA: Diagnosis present

## 2017-08-26 DIAGNOSIS — Z87891 Personal history of nicotine dependence: Secondary | ICD-10-CM | POA: Diagnosis not present

## 2017-08-26 LAB — TYPE AND SCREEN
ABO/RH(D): O POS
ANTIBODY SCREEN: NEGATIVE

## 2017-08-26 LAB — CBC
HCT: 30.9 % — ABNORMAL LOW (ref 36.0–46.0)
Hemoglobin: 10.6 g/dL — ABNORMAL LOW (ref 12.0–15.0)
MCH: 26.9 pg (ref 26.0–34.0)
MCHC: 34.3 g/dL (ref 30.0–36.0)
MCV: 78.4 fL (ref 78.0–100.0)
PLATELETS: 208 10*3/uL (ref 150–400)
RBC: 3.94 MIL/uL (ref 3.87–5.11)
RDW: 14.1 % (ref 11.5–15.5)
WBC: 11 10*3/uL — AB (ref 4.0–10.5)

## 2017-08-26 LAB — RPR: RPR Ser Ql: NONREACTIVE

## 2017-08-26 MED ORDER — OXYCODONE HCL 5 MG PO TABS
10.0000 mg | ORAL_TABLET | ORAL | Status: DC | PRN
  Administered 2017-08-27 (×2): 10 mg via ORAL
  Filled 2017-08-26 (×3): qty 2

## 2017-08-26 MED ORDER — PHENYLEPHRINE 40 MCG/ML (10ML) SYRINGE FOR IV PUSH (FOR BLOOD PRESSURE SUPPORT)
80.0000 ug | PREFILLED_SYRINGE | INTRAVENOUS | Status: DC | PRN
  Filled 2017-08-26: qty 5

## 2017-08-26 MED ORDER — COCONUT OIL OIL: 1.0000 "application " | TOPICAL_OIL | Status: DC | PRN

## 2017-08-26 MED ORDER — EPHEDRINE 5 MG/ML INJ
10.0000 mg | INTRAVENOUS | Status: DC | PRN
  Filled 2017-08-26: qty 2

## 2017-08-26 MED ORDER — LIDOCAINE HCL (PF) 1 % IJ SOLN
INTRAMUSCULAR | Status: DC | PRN
  Administered 2017-08-26: 6 mL via EPIDURAL
  Administered 2017-08-26: 4 mL

## 2017-08-26 MED ORDER — SOD CITRATE-CITRIC ACID 500-334 MG/5ML PO SOLN: 30.0000 mL | ORAL | Status: DC | PRN

## 2017-08-26 MED ORDER — IBUPROFEN 600 MG PO TABS
600.0000 mg | ORAL_TABLET | Freq: Four times a day (QID) | ORAL | Status: DC
  Administered 2017-08-26 – 2017-08-28 (×9): 600 mg via ORAL
  Filled 2017-08-26 (×10): qty 1

## 2017-08-26 MED ORDER — FENTANYL 2.5 MCG/ML BUPIVACAINE 1/10 % EPIDURAL INFUSION (WH - ANES)
INTRAMUSCULAR | Status: AC
Start: 2017-08-26 — End: 2017-08-26
  Filled 2017-08-26: qty 100

## 2017-08-26 MED ORDER — LACTATED RINGERS IV SOLN
500.0000 mL | Freq: Once | INTRAVENOUS | Status: AC
  Administered 2017-08-26: 500 mL via INTRAVENOUS

## 2017-08-26 MED ORDER — FENTANYL 2.5 MCG/ML BUPIVACAINE 1/10 % EPIDURAL INFUSION (WH - ANES)
14.0000 mL/h | INTRAMUSCULAR | Status: DC | PRN
  Administered 2017-08-26: 14 mL/h via EPIDURAL

## 2017-08-26 MED ORDER — OXYTOCIN BOLUS FROM INFUSION
500.0000 mL | Freq: Once | INTRAVENOUS | Status: AC
  Administered 2017-08-26: 500 mL via INTRAVENOUS

## 2017-08-26 MED ORDER — SIMETHICONE 80 MG PO CHEW: 80.0000 mg | CHEWABLE_TABLET | ORAL | Status: DC | PRN

## 2017-08-26 MED ORDER — LIDOCAINE HCL (PF) 1 % IJ SOLN
30.0000 mL | INTRAMUSCULAR | Status: DC | PRN
  Filled 2017-08-26: qty 30

## 2017-08-26 MED ORDER — DIPHENHYDRAMINE HCL 25 MG PO CAPS: 25.0000 mg | ORAL_CAPSULE | Freq: Four times a day (QID) | ORAL | Status: DC | PRN

## 2017-08-26 MED ORDER — TETANUS-DIPHTH-ACELL PERTUSSIS 5-2.5-18.5 LF-MCG/0.5 IM SUSP: 0.5000 mL | Freq: Once | INTRAMUSCULAR | Status: DC

## 2017-08-26 MED ORDER — ACETAMINOPHEN 325 MG PO TABS
650.0000 mg | ORAL_TABLET | ORAL | Status: DC | PRN
  Administered 2017-08-26 – 2017-08-28 (×3): 650 mg via ORAL
  Filled 2017-08-26 (×2): qty 2

## 2017-08-26 MED ORDER — DIPHENHYDRAMINE HCL 50 MG/ML IJ SOLN: 12.5000 mg | INTRAMUSCULAR | Status: DC | PRN

## 2017-08-26 MED ORDER — OXYCODONE-ACETAMINOPHEN 5-325 MG PO TABS: 1.0000 | ORAL_TABLET | ORAL | Status: DC | PRN

## 2017-08-26 MED ORDER — DIBUCAINE 1 % RE OINT: 1.0000 "application " | TOPICAL_OINTMENT | RECTAL | Status: DC | PRN

## 2017-08-26 MED ORDER — OXYCODONE HCL 5 MG PO TABS
5.0000 mg | ORAL_TABLET | ORAL | Status: DC | PRN
Start: 2017-08-26 — End: 2017-08-28
  Administered 2017-08-26 – 2017-08-28 (×4): 5 mg via ORAL
  Filled 2017-08-26 (×4): qty 1

## 2017-08-26 MED ORDER — LIDOCAINE HCL (PF) 1 % IJ SOLN
INTRAMUSCULAR | Status: AC
  Filled 2017-08-26: qty 30

## 2017-08-26 MED ORDER — LACTATED RINGERS IV SOLN: 500.0000 mL | INTRAVENOUS | Status: DC | PRN

## 2017-08-26 MED ORDER — ACETAMINOPHEN 325 MG PO TABS
650.0000 mg | ORAL_TABLET | ORAL | Status: DC | PRN
  Filled 2017-08-26: qty 2

## 2017-08-26 MED ORDER — SENNOSIDES-DOCUSATE SODIUM 8.6-50 MG PO TABS
2.0000 | ORAL_TABLET | ORAL | Status: DC
  Administered 2017-08-26 – 2017-08-27 (×2): 2 via ORAL
  Filled 2017-08-26 (×2): qty 2

## 2017-08-26 MED ORDER — DEXTROSE 5 % IV SOLN
5.0000 10*6.[IU] | Freq: Once | INTRAVENOUS | Status: AC
  Administered 2017-08-26: 5 10*6.[IU] via INTRAVENOUS
  Filled 2017-08-26: qty 5

## 2017-08-26 MED ORDER — OXYTOCIN 40 UNITS IN LACTATED RINGERS INFUSION - SIMPLE MED
INTRAVENOUS | Status: AC
  Administered 2017-08-26: 500 mL via INTRAVENOUS
  Filled 2017-08-26: qty 1000

## 2017-08-26 MED ORDER — PHENYLEPHRINE 40 MCG/ML (10ML) SYRINGE FOR IV PUSH (FOR BLOOD PRESSURE SUPPORT)
PREFILLED_SYRINGE | INTRAVENOUS | Status: AC
  Filled 2017-08-26: qty 10

## 2017-08-26 MED ORDER — PRENATAL MULTIVITAMIN CH
1.0000 | ORAL_TABLET | Freq: Every day | ORAL | Status: DC
  Administered 2017-08-26 – 2017-08-28 (×3): 1 via ORAL
  Filled 2017-08-26 (×3): qty 1

## 2017-08-26 MED ORDER — ZOLPIDEM TARTRATE 5 MG PO TABS: 5.0000 mg | ORAL_TABLET | Freq: Every evening | ORAL | Status: DC | PRN

## 2017-08-26 MED ORDER — WITCH HAZEL-GLYCERIN EX PADS: 1.0000 "application " | MEDICATED_PAD | CUTANEOUS | Status: DC | PRN

## 2017-08-26 MED ORDER — BENZOCAINE-MENTHOL 20-0.5 % EX AERO
1.0000 "application " | INHALATION_SPRAY | CUTANEOUS | Status: DC | PRN
  Administered 2017-08-28: 1 via TOPICAL
  Filled 2017-08-26: qty 56

## 2017-08-26 MED ORDER — ONDANSETRON HCL 4 MG PO TABS
4.0000 mg | ORAL_TABLET | ORAL | Status: DC | PRN
  Filled 2017-08-26: qty 1

## 2017-08-26 MED ORDER — PENICILLIN G POT IN DEXTROSE 60000 UNIT/ML IV SOLN
3.0000 10*6.[IU] | INTRAVENOUS | Status: DC
  Filled 2017-08-26 (×5): qty 50

## 2017-08-26 MED ORDER — ONDANSETRON HCL 4 MG/2ML IJ SOLN: 4.0000 mg | Freq: Four times a day (QID) | INTRAMUSCULAR | Status: DC | PRN

## 2017-08-26 MED ORDER — OXYTOCIN 40 UNITS IN LACTATED RINGERS INFUSION - SIMPLE MED: 2.5000 [IU]/h | INTRAVENOUS | Status: DC

## 2017-08-26 MED ORDER — OXYCODONE-ACETAMINOPHEN 5-325 MG PO TABS: 2.0000 | ORAL_TABLET | ORAL | Status: DC | PRN

## 2017-08-26 MED ORDER — ONDANSETRON HCL 4 MG/2ML IJ SOLN: 4.0000 mg | INTRAMUSCULAR | Status: DC | PRN

## 2017-08-26 MED ORDER — LACTATED RINGERS IV SOLN
INTRAVENOUS | Status: DC
  Administered 2017-08-26: 03:00:00 via INTRAVENOUS

## 2017-08-26 NOTE — Anesthesia Procedure Notes (Signed)
Epidural Patient location during procedure: OB  Staffing Anesthesiologist: Daelon Dunivan, MD  Preanesthetic Checklist Completed: patient identified, pre-op evaluation, timeout performed, IV checked, risks and benefits discussed and monitors and equipment checked  Epidural Patient position: sitting Prep: DuraPrep Patient monitoring: blood pressure and continuous pulse ox Approach: right paramedian Location: L3-L4 Injection technique: LOR air  Needle:  Needle type: Tuohy  Needle gauge: 17 G Needle insertion depth: 5 cm Catheter type: closed end flexible Catheter size: 19 Gauge Catheter at skin depth: 10 cm Test dose: negative  Assessment Sensory level: T8  Additional Notes   Dosing of Epidural:  1st dose, through catheter .............................................  Xylocaine 40 mg  2nd dose, through catheter, after waiting 3 minutes.........Xylocaine 60 mg    As each dose occurred, patient was free of IV sx; and patient exhibited no evidence of SA injection.  Patient is more comfortable after epidural dosed. Please see RN's note for documentation of vital signs,and FHR which are stable.  Patient reminded not to try to ambulate with numb legs, and that an RN must be present when she attempts to get up.          

## 2017-08-26 NOTE — Anesthesia Preprocedure Evaluation (Signed)
Anesthesia Evaluation  Patient identified by MRN, date of birth, ID band Patient awake    Reviewed: Allergy & Precautions, H&P , Patient's Chart, lab work & pertinent test results  Airway Mallampati: II  TM Distance: >3 FB Neck ROM: full    Dental  (+) Teeth Intact   Pulmonary former smoker,    breath sounds clear to auscultation       Cardiovascular  Rhythm:regular Rate:Normal     Neuro/Psych    GI/Hepatic   Endo/Other    Renal/GU      Musculoskeletal   Abdominal   Peds  Hematology   Anesthesia Other Findings       Reproductive/Obstetrics (+) Pregnancy                             Anesthesia Physical  Anesthesia Plan  ASA: II  Anesthesia Plan: Epidural   Post-op Pain Management:    Induction:   PONV Risk Score and Plan:   Airway Management Planned:   Additional Equipment:   Intra-op Plan:   Post-operative Plan:   Informed Consent: I have reviewed the patients History and Physical, chart, labs and discussed the procedure including the risks, benefits and alternatives for the proposed anesthesia with the patient or authorized representative who has indicated his/her understanding and acceptance.   Dental Advisory Given  Plan Discussed with:   Anesthesia Plan Comments: (Labs checked- platelets confirmed with RN in room. Fetal heart tracing, per RN, reported to be stable enough for sitting procedure. Discussed epidural, and patient consents to the procedure:  included risk of possible headache,backache, failed block, allergic reaction, and nerve injury. This patient was asked if she had any questions or concerns before the procedure started.)        Anesthesia Quick Evaluation  
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
## 2017-08-26 NOTE — Lactation Note (Signed)
This note was copied from a baby's chart. Lactation Consultation Note  Patient Name: Linda Barry UJWJX'BToday's Date: 08/26/2017 Reason for consult: Initial assessment;1st time breastfeeding;Mother's request;Nipple pain/trauma  Initial visit at 9 hours of age. RN requested visit due to nipples pain for mom.   Mom reports good feedings, but having sore nipples with feedings.  Lc noted mom holding baby in cross cradle hold with good pillow support.  Baby has wide gape and flanged lips.  Mom reports pain of "6" on scale of 1-10.  LC continued to work on positioning. LC removed baby from breast and noted nipple compression.  With re-latching mom reports some improvement in comfort.  Mom is also having uterine cramping during this feeding.   Baby continued feeding with some stimulation for additional 10 minutes with few swallows audible.  LC worked with mom to wait for wide open mouth and positioning baby with nose at nipple level for feedings.  LC assisted with blankets and pillows for support.   [facility] resources given and discussed.  Encouraged to feed with early cues on demand.  Early newborn behavior discussed.  Hand expression demonstrated by mom with colostrum visible.  Mom to call for assist as needed.     Maternal Data Has patient been taught Hand Expression?: Yes Does the patient have breastfeeding experience prior to this delivery?: No  Feeding Feeding Type: Breast Fed Length of feed: 10 min  LATCH Score Latch: Grasps breast easily, tongue down, lips flanged, rhythmical sucking.  Audible Swallowing: A few with stimulation  Type of Nipple: Everted at rest and after stimulation  Comfort (Breast/Nipple): Filling, red/small blisters or bruises, mild/mod discomfort  Hold (Positioning): Assistance needed to correctly position infant at breast and maintain latch.  LATCH Score: 7  Interventions Interventions: Breast feeding basics reviewed;Support pillows;Assisted with latch;Skin to  skin;Expressed milk;Breast massage;Hand express;Breast compression;Adjust position  Lactation Tools Discussed/Used WIC Program: Yes   Consult Status Consult Status: Follow-up    Franz DellJana Libi Corso 08/26/2017, 2:34 PM

## 2017-08-26 NOTE — H&P (Signed)
Linda Barry is a 26 y.o. female G3P2002 at 7539 0/7 weeks (EDD 09/02/17 by LMP c/w 16 week US)  presenting for painful contractions and cervical change to 4cm. 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  OB History    Gravida Para Term Preterm AB Living   3 2 2     2    SAB TAB Ectopic Multiple Live Births           2    Vacuum delivery 2010 7 1/2lbs NSVD 2013 7 1/2 lbs   Past Medical History:  Diagnosis Date  . Depression   . Placenta abruption, delivered, current hospitalization 04/15/2012  . Sickle cell trait (HCC)   . SVD (spontaneous vaginal delivery) 04/15/2012   Past Surgical History:  Procedure Laterality Date  . ORIF ANKLE FRACTURE Left 02/28/2016   Procedure: OPEN REDUCTION INTERNAL FIXATION (ORIF) LEFT ANKLE FRACTURE;  Surgeon: Samson FredericBrian Swinteck, MD;  Location: [facility] OR;  Service: Orthopedics;  Laterality: Left;   Family History: family history is not on file. Social History:  reports that she quit smoking about 5 years ago. Her smoking use included cigarettes. She smoked 0.25 packs per day. she has never used smokeless tobacco. She reports that she does not drink alcohol or use drugs.     Maternal Diabetes: No Genetic Screening: Normal Tetra Maternal Ultrasounds/Referrals: Normal Fetal Ultrasounds or other Referrals:  None Maternal Substance Abuse:  No Significant Maternal Medications:  None Significant Maternal Lab Results:  Lab values include: Group B Strep positive, Other:  hemoglobin C carrier Other Comments:  None  Review of Systems  Gastrointestinal: Positive for abdominal pain. Negative for diarrhea.   Maternal Medical History:  Reason for admission: Contractions.   Contractions: Onset was 3-5 hours ago.   Frequency: regular.   Perceived severity is moderate.    Fetal activity: Perceived fetal activity is normal.    Prenatal Complications - Diabetes:  none.    Dilation: 4 Effacement (%): 90 Station: -1 Exam by:: lauren fields rn  Blood pressure 131/73, pulse 97, temperature 97.7 F (36.5 C), temperature source Oral, resp. rate 19, height 5\' 6"  (1.676 m), weight 88.5 kg (195 lb), last menstrual period 11/26/2016. Maternal Exam:  Uterine Assessment: Contraction strength is moderate.  Contraction frequency is regular.   Abdomen: Patient reports no abdominal tenderness. Fetal presentation: vertex  Introitus: Normal vulva. Normal vagina.    Physical Exam  Constitutional: She appears well-developed.  Cardiovascular: Normal rate and regular rhythm.  Respiratory: Effort normal.  GI: Soft.  Genitourinary: Vagina normal.  Neurological: She is alert.  Psychiatric: She has a normal mood and affect.    Prenatal labs: ABO, Rh: --/--/O POS (04/30 1242) Antibody:   negative Rubella:  Immune RPR:   NR HBsAg:   Neg HIV:   NR GBS:   Positive One hour GCT 115 Tetra screen negative SMA, Fragile X and Cf screen negative  Assessment/Plan: Pt admitted for early labor and requesting epidural. Will allow and get PCN going for + GBS.  AROM after PCN on board.    Oliver PilaKathy W Shomari Matusik 08/26/2017, 2:29 AM

## 2017-08-26 NOTE — Anesthesia Postprocedure Evaluation (Signed)
Anesthesia Post Note  Patient: Linda Barry  Procedure(s) Performed: AN AD HOC LABOR EPIDURAL     Patient location during evaluation: Mother Baby Anesthesia Type: Epidural Level of consciousness: awake and alert and oriented Pain management: pain level controlled Vital Signs Assessment: post-procedure vital signs reviewed and stable Respiratory status: spontaneous breathing and nonlabored ventilation Cardiovascular status: stable Postop Assessment: no headache, adequate PO intake, no backache, epidural receding, patient able to bend at knees and no apparent nausea or vomiting Anesthetic complications: no    Last Vitals:  Vitals:   08/26/17 0641 08/26/17 0750  BP: 114/71 109/70  Pulse: 84 73  Resp: 18 20  Temp: 36.4 C 36.7 C    Last Pain:  Vitals:   08/26/17 0750  TempSrc: Axillary  PainSc: 0-No pain   Pain Goal: Patients Stated Pain Goal: 0 (08/26/17 0301)               Laban EmperorMalinova,Jaevin Medearis Hristova

## 2017-08-26 NOTE — Plan of Care (Signed)
Progressing appropriately. Asking appropriate questions about condition.

## 2017-08-27 LAB — CBC
HCT: 32.5 % — ABNORMAL LOW (ref 36.0–46.0)
Hemoglobin: 11.2 g/dL — ABNORMAL LOW (ref 12.0–15.0)
MCH: 27.2 pg (ref 26.0–34.0)
MCHC: 34.5 g/dL (ref 30.0–36.0)
MCV: 78.9 fL (ref 78.0–100.0)
PLATELETS: 235 10*3/uL (ref 150–400)
RBC: 4.12 MIL/uL (ref 3.87–5.11)
RDW: 14.4 % (ref 11.5–15.5)
WBC: 12.8 10*3/uL — AB (ref 4.0–10.5)

## 2017-08-27 NOTE — Progress Notes (Signed)
Post Partum Day 1 Subjective: no complaints, up ad lib, voiding, tolerating PO and nl lochia, pain controlled  Objective: Blood pressure 124/74, pulse 89, temperature 98.2 F (36.8 C), temperature source Oral, resp. rate 18, height 5\' 6"  (1.676 m), weight 88.5 kg (195 lb), last menstrual period 11/26/2016, unknown if currently breastfeeding.  Physical Exam:  General: alert and no distress Lochia: appropriate Uterine Fundus: firm  Recent Labs    08/26/17 0240 08/27/17 0534  HGB 10.6* 11.2*  HCT 30.9* 32.5*    Assessment/Plan: Plan for discharge tomorrow, Breastfeeding and Lactation consult.  D/C home tomorrow.     LOS: 1 day   Shauna Bodkins Bovard-Stuckert 08/27/2017, 7:12 AM

## 2017-08-28 MED ORDER — IBUPROFEN 600 MG PO TABS: 600.0000 mg | ORAL_TABLET | Freq: Four times a day (QID) | ORAL | 1 refills | Status: DC | PRN

## 2017-08-28 MED ORDER — PRENATAL MULTIVITAMIN CH: 1.0000 | ORAL_TABLET | Freq: Every day | ORAL | 3 refills | Status: DC

## 2017-08-28 NOTE — Progress Notes (Addendum)
Post Partum Day 2 Subjective: no complaints, up ad lib, voiding, tolerating PO and nl lochia, pain controlled  Objective: Blood pressure 103/66, pulse 70, temperature 98.6 F (37 C), temperature source Oral, resp. rate 20, height 5\' 6"  (1.676 m), weight 88.5 kg (195 lb), last menstrual period 11/26/2016, SpO2 98 %, unknown if currently breastfeeding.  Physical Exam:  General: alert, cooperative and no distress Lochia: appropriate Uterine Fundus: firm  Recent Labs    08/26/17 0240 08/27/17 0534  HGB 10.6* 11.2*  HCT 30.9* 32.5*    Assessment/Plan: Plan for discharge tomorrow, Breastfeeding and Lactation consult.  Routine PP care, d/c home with motrin and PNV.     LOS: 2 days   Noma Quijas Bovard-Stuckert 08/28/2017, 8:12 AM

## 2017-08-28 NOTE — Discharge Summary (Signed)
OB Discharge Summary     Patient Name: Linda Barry DOB: 02/09/1991 MRN: [id number]  Date of admission: 08/26/2017 Delivering MD: Huel CoteICHARDSON, KATHY   Date of discharge: 08/28/2017  Admitting diagnosis: 39 WEEKS CTX  Intrauterine pregnancy: 426w0d     Secondary diagnosis:  Active Problems:   Indication for care in labor and delivery, antepartum   NSVD (normal spontaneous vaginal delivery)  Additional problems: N/A     Discharge diagnosis: Term Pregnancy Delivered                                                                                                Post partum procedures:N/A  Augmentation: AROM  Complications: None  Hospital course:  Onset of Labor With Vaginal Delivery     26 y.o. yo G3P3003 at 376w0d was admitted in Latent Labor on 08/26/2017. Patient had an uncomplicated labor course as follows:  Membrane Rupture Time/Date: 4:25 AM ,08/26/2017   Intrapartum Procedures: Episiotomy: None [1]                                         Lacerations:  None [1]  Patient had a delivery of a Viable infant. 08/26/2017  Information for the patient's newborn:  Mariah Milling[place], Boy Leyana [[id number]][[id number]]  Delivery Method: Vaginal, Spontaneous(Filed from Delivery Summary)    Pateint had an uncomplicated postpartum course.  She is ambulating, tolerating a regular diet, passing flatus, and urinating well. Patient is discharged home in stable condition on 08/28/17.   Physical exam  Vitals:   08/26/17 2003 08/27/17 0515 08/27/17 1900 08/28/17 0530  BP: 115/62 124/74 125/80 103/66  Pulse: 76 89 74 70  Resp: 20 18 20    Temp: 98.4 F (36.9 C) 98.2 F (36.8 C) 98.6 F (37 C) 98.6 F (37 C)  TempSrc: Oral Oral Oral Oral  SpO2:    98%  Weight:      Height:       General: alert and no distress Lochia: appropriate Uterine Fundus: firm  Labs: Lab Results  Component Value Date   WBC 12.8 (H) 08/27/2017   HGB 11.2 (L) 08/27/2017   HCT 32.5 (L) 08/27/2017   MCV 78.9 08/27/2017   PLT  235 08/27/2017   CMP Latest Ref Rng & Units 02/02/2012  Glucose 70 - 99 mg/dL 89  BUN 6 - 23 mg/dL 8  Creatinine 0.860.50 - 5.781.10 mg/dL 4.690.70  Sodium 629135 - 528145 mEq/L 137  Potassium 3.5 - 5.1 mEq/L 3.6  Chloride 96 - 112 mEq/L 106  CO2 19 - 32 mEq/L -  Calcium 8.4 - 10.5 mg/dL -  Total Protein 6.0 - 8.3 g/dL -  Total Bilirubin 0.3 - 1.2 mg/dL -  Alkaline Phos 39 - 413117 U/L -  AST 0 - 37 U/L -  ALT 0 - 35 U/L -    Discharge instruction: per After Visit Summary and "Baby and Me Booklet".  After visit meds:  Allergies as of 08/28/2017      Reactions   Latex Other (See  Comments)   Gloves irritate eczema on hands, no problems elsewhere per patient      Medication List    TAKE these medications   acetaminophen 500 MG tablet Commonly known as:  TYLENOL Take 500 mg by mouth every 6 (six) hours as needed.   ibuprofen 600 MG tablet Commonly known as:  ADVIL,MOTRIN Take 1 tablet (600 mg total) by mouth every 6 (six) hours as needed.   prenatal multivitamin Tabs tablet Take 1 tablet by mouth daily at 12 noon.       Diet: routine diet  Activity: Advance as tolerated. Pelvic rest for 6 weeks.   Outpatient follow up:6 weeks Follow up Appt:No future appointments. Follow up Visit:No Follow-up on file.  Postpartum contraception: Undecided  Newborn Data: Live born female  Birth Weight: 7 lb 12.7 oz (3535 g) APGAR: 9, 9  Newborn Delivery   Birth date/time:  08/26/2017 04:55:00 Delivery type:  Vaginal, Spontaneous     Baby Feeding: Bottle and Breast Disposition:home with mother   08/28/2017 Sherian ReinJody Bovard-Stuckert, MD

## 2017-08-31 ENCOUNTER — Inpatient Hospital Stay (HOSPITAL_COMMUNITY): Payer: Medicaid Other

## 2018-05-25 IMAGING — US US OB TRANSVAGINAL
1 series · 15 of 28 positions shown · non-contrast
Comparison: None.

CLINICAL DATA: Abdominal cramping today

EXAM:
OBSTETRIC <14 WK US AND TRANSVAGINAL OB US
TECHNIQUE: Both transabdominal and transvaginal ultrasound examinations were
performed for complete evaluation of the gestation as well as the
maternal uterus, adnexal regions, and pelvic cul-de-sac.
Transvaginal technique was performed to assess early pregnancy.

[Series 1: us ob transvaginal · 15 of 65 slices shown]
[im 1/65]
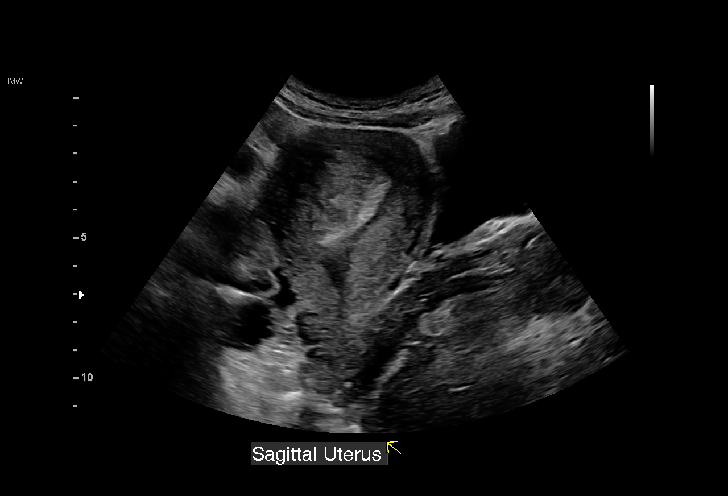
[im 5/65]
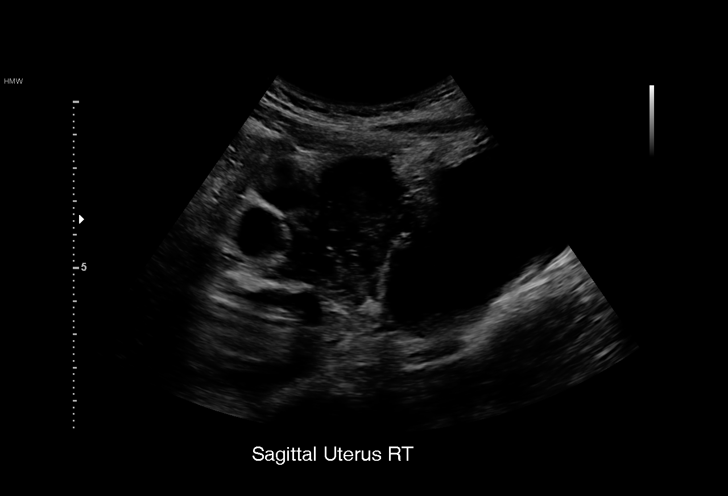
[im 10/65]
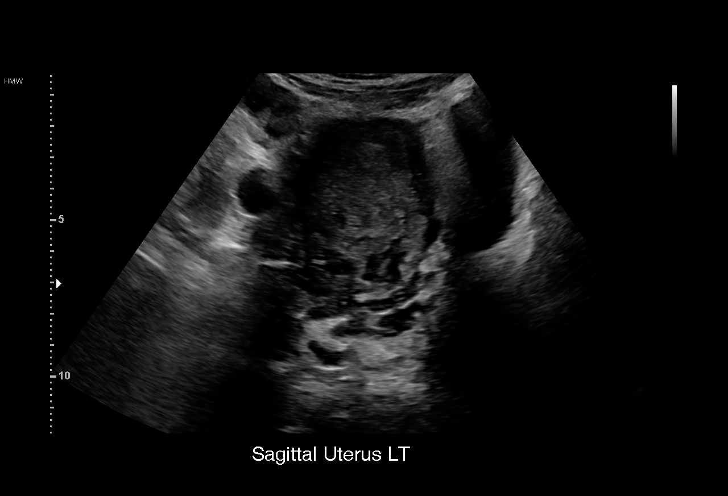
[im 15/65]
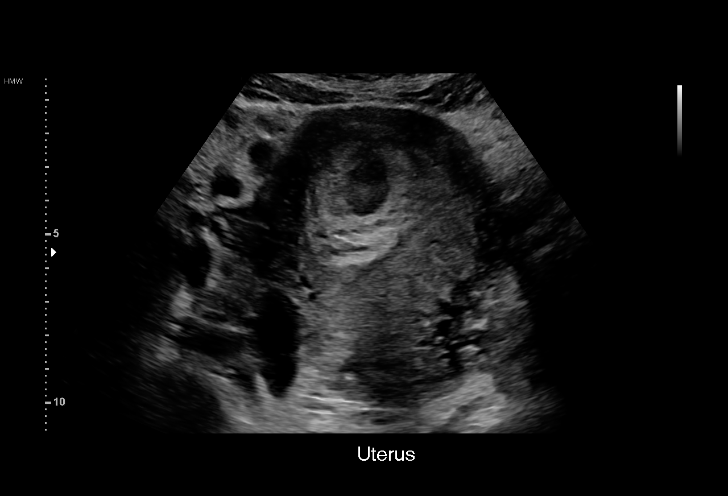
[im 19/65]
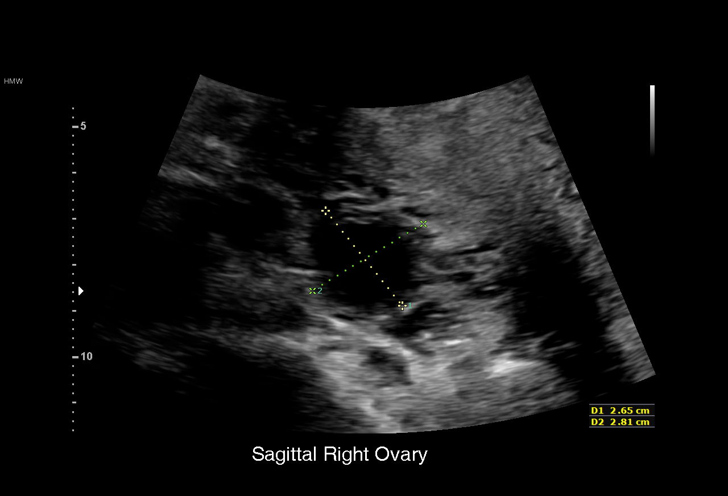
[im 24/65]
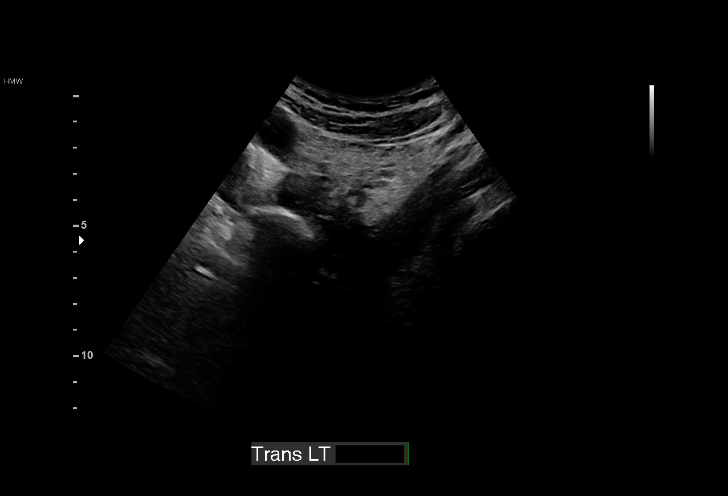
[im 29/65]
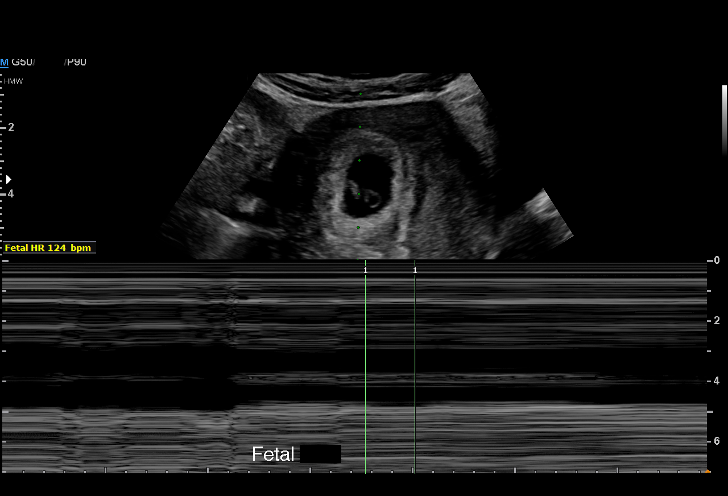
[im 34/65]
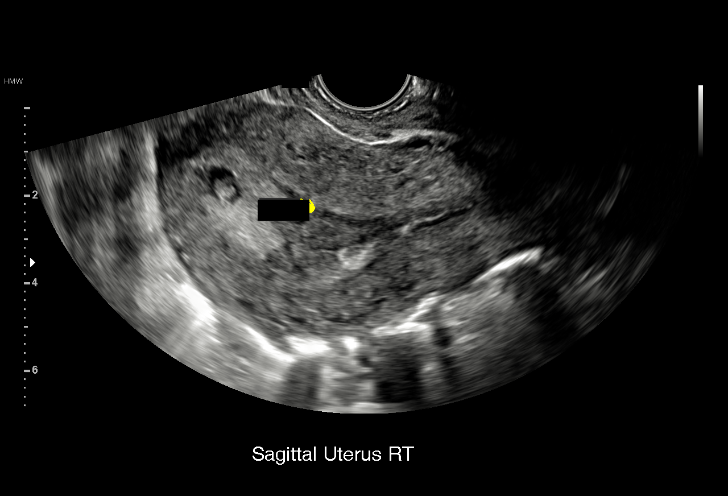
[im 36/65]
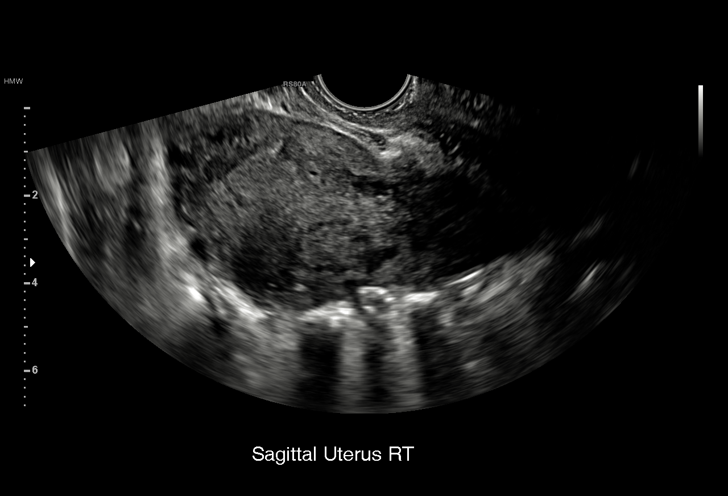
[im 41/65]
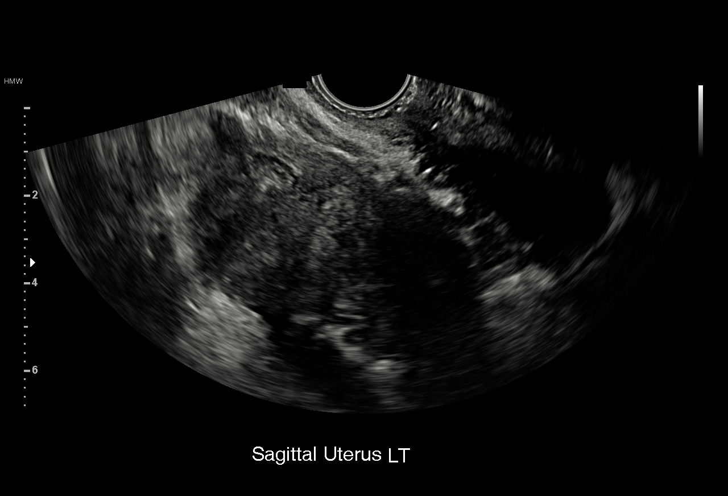
[im 46/65]
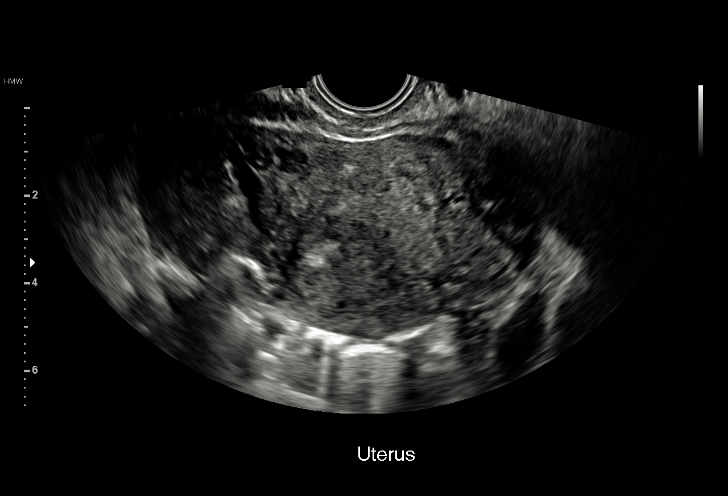
[im 50/65]
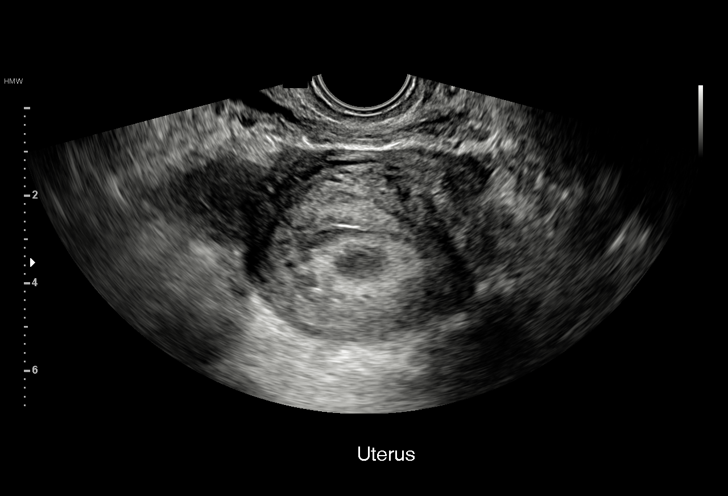
[im 55/65]
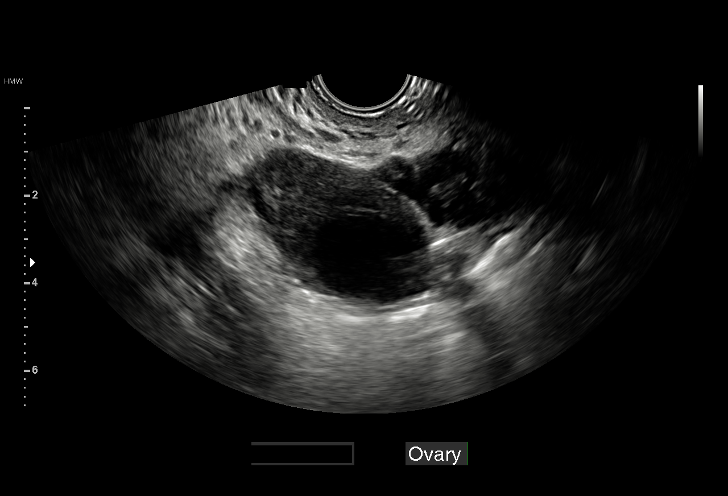
[im 60/65]
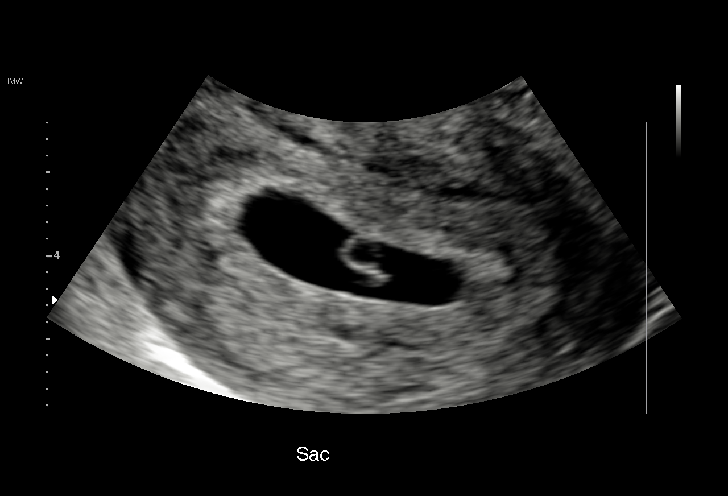
[im 65/65]
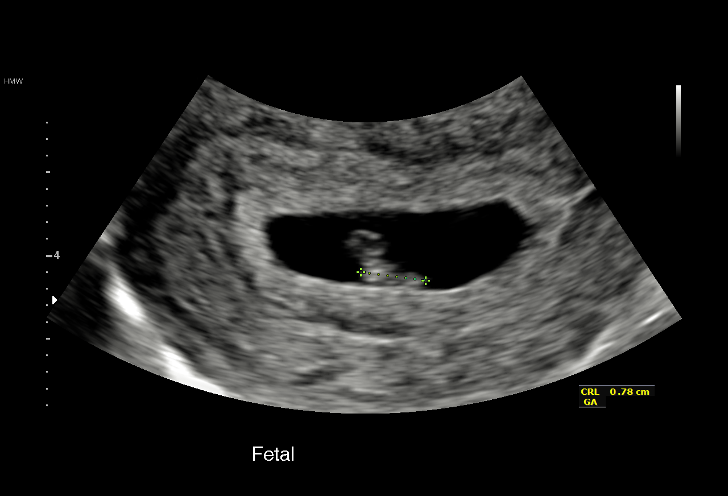

[15 of 28 positions shown; findings below may reference images not displayed]

FINDINGS: Intrauterine gestational sac: Single

Yolk sac:  Visualized

Embryo:  Visualized

Cardiac Activity: Visualized

Heart Rate: 130  bpm

MSD:   mm    w     d

CRL:  7.9  mm   6 w   4 d                  US EDC: 09/02/2017

Subchorionic hemorrhage:  Small subchorionic hemorrhage

Maternal uterus/adnexae: No adnexal masses or free fluid.
IMPRESSION: Six week 4 day intrauterine pregnancy. Fetal heart rate 130 beats
per minute. No acute maternal findings.

## 2018-07-20 ENCOUNTER — Encounter (HOSPITAL_COMMUNITY): Payer: Self-pay | Admitting: *Deleted

## 2018-07-20 ENCOUNTER — Inpatient Hospital Stay (HOSPITAL_COMMUNITY)
Admission: AD | Admit: 2018-07-20 | Discharge: 2018-07-20 | Disposition: A | Discharge status: Home / Self Care | Payer: Medicaid Other | Source: Ambulatory Visit | Attending: Obstetrics and Gynecology | Admitting: Obstetrics and Gynecology

## 2018-07-20 DIAGNOSIS — Z3A01 Less than 8 weeks gestation of pregnancy: Secondary | ICD-10-CM

## 2018-07-20 DIAGNOSIS — O219 Vomiting of pregnancy, unspecified: Secondary | ICD-10-CM

## 2018-07-20 DIAGNOSIS — Z87891 Personal history of nicotine dependence: Secondary | ICD-10-CM | POA: Insufficient documentation

## 2018-07-20 DIAGNOSIS — O210 Mild hyperemesis gravidarum: Secondary | ICD-10-CM | POA: Insufficient documentation

## 2018-07-20 DIAGNOSIS — O26891 Other specified pregnancy related conditions, first trimester: Secondary | ICD-10-CM

## 2018-07-20 LAB — URINALYSIS, ROUTINE W REFLEX MICROSCOPIC
Bilirubin Urine: NEGATIVE
GLUCOSE, UA: NEGATIVE mg/dL
HGB URINE DIPSTICK: NEGATIVE
Ketones, ur: 20 mg/dL — AB
NITRITE: NEGATIVE
Protein, ur: NEGATIVE mg/dL
SPECIFIC GRAVITY, URINE: 1.024 (ref 1.005–1.030)
pH: 6 (ref 5.0–8.0)

## 2018-07-20 LAB — POCT PREGNANCY, URINE: PREG TEST UR: POSITIVE — AB

## 2018-07-20 MED ORDER — PRENATAL VITAMIN PLUS LOW IRON 27-1 MG PO TABS: 1.0000 | ORAL_TABLET | Freq: Every day | ORAL | 11 refills | Status: DC

## 2018-07-20 MED ORDER — PROMETHAZINE HCL 25 MG PO TABS: 12.5000 mg | ORAL_TABLET | Freq: Four times a day (QID) | ORAL | 3 refills | Status: DC | PRN

## 2018-07-20 NOTE — MAU Provider Note (Addendum)
History     CSN: [id number]  Arrival date and time: 07/20/18 1328   First Provider Initiated Contact with Patient 07/20/18 1434    Chief Complaint  Patient presents with  . Possible Pregnancy  . Emesis   Linda Barry is a 27 y.o. 27 y.o. [phone] at [redacted]w[redacted]d who presents today with nausea and vomiting. She states that for approx 1 week she has been vomiting in the morning, and occasionally will vomit in the afternoon. She has not tried anything at this time.   Emesis   This is a new problem. The current episode started in the past 7 days. The problem occurs 2 to 4 times per day. The problem has been unchanged (worse in the morning ). The emesis has an appearance of stomach contents. There has been no fever. Pertinent negatives include no chills, diarrhea or fever. Risk factors: pregnancy  She has tried nothing for the symptoms.    OB History    Gravida  4   Para  3   Term  3   Preterm      AB      Living  3     SAB      TAB      Ectopic      Multiple  0   Live Births  3           Past Medical History:  Diagnosis Date  . Depression   . Placenta abruption, delivered, current hospitalization 04/15/2012  . Sickle cell trait (HCC)   . SVD (spontaneous vaginal delivery) 04/15/2012    Past Surgical History:  Procedure Laterality Date  . ORIF ANKLE FRACTURE Left 02/28/2016   Procedure: OPEN REDUCTION INTERNAL FIXATION (ORIF) LEFT ANKLE FRACTURE;  Surgeon: Samson Frederic, MD;  Location: [facility] OR;  Service: Orthopedics;  Laterality: Left;    Family History  Problem Relation Age of Onset  . Other Neg Hx     Social History   Tobacco Use  . Smoking status: Former Smoker    Packs/day: 0.25    Types: Cigarettes    Last attempt to quit: 02/01/2012    Years since quitting: 6.4  . Smokeless tobacco: Never Used  Substance Use Topics  . Alcohol use: No  . Drug use: No    Allergies:  Allergies  Allergen Reactions  . Latex Other (See Comments)    Gloves irritate  eczema on hands, no problems elsewhere per patient    Medications Prior to Admission  Medication Sig Dispense Refill Last Dose  . acetaminophen (TYLENOL) 500 MG tablet Take 500 mg by mouth every 6 (six) hours as needed.   Past Week at Unknown time  . ibuprofen (ADVIL,MOTRIN) 600 MG tablet Take 1 tablet (600 mg total) by mouth every 6 (six) hours as needed. 45 tablet 1   . Prenatal Vit-Fe Fumarate-FA (PRENATAL MULTIVITAMIN) TABS tablet Take 1 tablet by mouth daily at 12 noon. 100 tablet 3     Review of Systems  Constitutional: Negative for chills and fever.  Gastrointestinal: Positive for nausea and vomiting. Negative for blood in stool, constipation and diarrhea.  Genitourinary: Negative for dysuria, frequency, pelvic pain, vaginal bleeding and vaginal discharge.   Physical Exam   Blood pressure 110/65, pulse (!) 57, temperature 98.7 F (37.1 C), temperature source Oral, resp. rate 16, last menstrual period 06/01/2018, SpO2 100 %, unknown if currently breastfeeding.  Physical Exam  Nursing note and vitals reviewed. Constitutional: She is oriented to person, place, and time. She appears  well-developed and well-nourished. No distress.  HENT:  Head: Normocephalic.  Cardiovascular: Normal rate.  Respiratory: Effort normal.  GI: Soft. There is no tenderness. There is no rebound.  Neurological: She is alert and oriented to person, place, and time.  Skin: Skin is warm and dry.  Psychiatric: She has a normal mood and affect.   Results for orders placed or performed during the hospital encounter of 07/20/18 (from the past 24 hour(s))  Urinalysis, Routine w reflex microscopic     Status: Abnormal   Collection Time: 07/20/18  2:12 PM  Result Value Ref Range   Color, Urine YELLOW YELLOW   APPearance HAZY (A) CLEAR   Specific Gravity, Urine 1.024 1.005 - 1.030   pH 6.0 5.0 - 8.0   Glucose, UA NEGATIVE NEGATIVE mg/dL   Hgb urine dipstick NEGATIVE NEGATIVE   Bilirubin Urine NEGATIVE  NEGATIVE   Ketones, ur 20 (A) NEGATIVE mg/dL   Protein, ur NEGATIVE NEGATIVE mg/dL   Nitrite NEGATIVE NEGATIVE   Leukocytes, UA TRACE (A) NEGATIVE   RBC / HPF 0-5 0 - 5 RBC/hpf   WBC, UA 11-20 0 - 5 WBC/hpf   Bacteria, UA MANY (A) NONE SEEN   Squamous Epithelial / LPF 0-5 0 - 5   Mucus PRESENT   Pregnancy, urine POC     Status: Abnormal   Collection Time: 07/20/18  2:14 PM  Result Value Ref Range   Preg Test, Ur POSITIVE (A) NEGATIVE     MAU Course  Procedures  MDM   Assessment and Plan   1. Nausea/vomiting in pregnancy   2. [redacted] weeks gestation of pregnancy    DC home Comfort measures reviewed  1st Trimester precautions  RX: phenergan PRN #30, prenatal vitamins QD #30 with 11RF  Return to MAU as needed FU with OB as planned  Follow-up Information    Department, [facility] Follow up.   Contact information: [address] [place] [address] [phone]            Thressa Sheller 07/20/2018, 2:52 PM

## 2018-07-20 NOTE — MAU Note (Signed)
Vomiting on Sunday and Monday. Positive preg test last week. Vomited today x one

## 2018-07-20 NOTE — Discharge Instructions (Signed)
[facility] Prenatal Care Providers          Center for [facility] @ [facility]   Phone: [phone]  [facility] for [facility] @ [facility]   Phone: [phone]  [facility] @[place]       Phone: [phone]            [facility] for [facility] @ [place]     Phone: [phone]          [facility] for [facility] @ [place]   Phone: [phone]  Center for [facility] @ [facility]  Phone: [phone]     Family [address] [place])  Phone: [phone] Safe Medications in Pregnancy   Acne: Benzoyl Peroxide Salicylic Acid  Backache/Headache: Tylenol: 2 regular strength every 4 hours OR              2 Extra strength every 6 hours  Colds/Coughs/Allergies: Benadryl (alcohol free) 25 mg every 6 hours as needed Breath right strips Claritin Cepacol throat lozenges Chloraseptic throat spray Cold-Eeze- up to three times per day Cough drops, alcohol free Flonase (by prescription only) Guaifenesin Mucinex Robitussin DM (plain only, alcohol free) Saline nasal spray/drops Sudafed (pseudoephedrine) & Actifed ** use only after [redacted] weeks gestation and if you do not have high blood pressure Tylenol Vicks Vaporub Zinc lozenges Zyrtec   Constipation: Colace Ducolax suppositories Fleet enema Glycerin suppositories Metamucil Milk of magnesia Miralax Senokot Smooth move tea  Diarrhea: Kaopectate Imodium A-D  *NO pepto Bismol  Hemorrhoids: Anusol Anusol HC Preparation H Tucks  Indigestion: Tums Maalox Mylanta Zantac  Pepcid  Insomnia: Benadryl (alcohol free) 25mg  every 6 hours as needed Tylenol PM Unisom, no Gelcaps  Leg Cramps: Tums MagGel  Nausea/Vomiting:  Bonine Dramamine Emetrol Ginger extract Sea bands Meclizine  Nausea medication to take during pregnancy:  Unisom (doxylamine succinate 25 mg tablets) Take one tablet daily at bedtime. If symptoms are not adequately controlled, the dose  can be increased to a maximum recommended dose of two tablets daily (1/2 tablet in the morning, 1/2 tablet mid-afternoon and one at bedtime). Vitamin B6 100mg  tablets. Take one tablet twice a day (up to 200 mg per day).  Skin Rashes: Aveeno products Benadryl cream or 25mg  every 6 hours as needed Calamine Lotion 1% cortisone cream  Yeast infection: Gyne-lotrimin 7 Monistat 7   **If taking multiple medications, please check labels to avoid duplicating the same active ingredients **take medication as directed on the label ** Do not exceed 4000 mg of tylenol in 24 hours **Do not take medications that contain aspirin or ibuprofen      Morning Sickness Morning sickness is when you feel sick to your stomach (nauseous) during pregnancy. This nauseous feeling may or may not come with vomiting. It often occurs in the morning but can be a problem any time of day. Morning sickness is most common during the first trimester, but it may continue throughout pregnancy. While morning sickness is unpleasant, it is usually harmless unless you develop severe and continual vomiting (hyperemesis gravidarum). This condition requires more intense treatment. What are the causes? The cause of morning sickness is not completely known but seems to be related to normal hormonal changes that occur in pregnancy. What increases the risk? You are at greater risk if you:  Experienced nausea or vomiting before your pregnancy.  Had morning sickness during a previous pregnancy.  Are pregnant with more than one baby, such as twins.  How is this treated? Do not use  any medicines (prescription, over-the-counter, or herbal) for morning sickness without first talking to your health care provider. Your health care provider may prescribe or recommend:  Vitamin B6 supplements.  Anti-nausea medicines.  The herbal medicine ginger.  Follow these instructions at home:  Only take over-the-counter or prescription  medicines as directed by your health care provider.  Taking multivitamins before getting pregnant can prevent or decrease the severity of morning sickness in most women.  Eat a piece of dry toast or unsalted crackers before getting out of bed in the morning.  Eat five or six small meals a day.  Eat dry and bland foods (rice, baked potato). Foods high in carbohydrates are often helpful.  Do not drink liquids with your meals. Drink liquids between meals.  Avoid greasy, fatty, and spicy foods.  Get someone to cook for you if the smell of any food causes nausea and vomiting.  If you feel nauseous after taking prenatal vitamins, take the vitamins at night or with a snack.  Snack on protein foods (nuts, yogurt, cheese) between meals if you are hungry.  Eat unsweetened gelatins for desserts.  Wearing an acupressure wristband (worn for sea sickness) may be helpful.  Acupuncture may be helpful.  Do not smoke.  Get a humidifier to keep the air in your house free of odors.  Get plenty of fresh air. Contact a health care provider if:  Your home remedies are not working, and you need medicine.  You feel dizzy or lightheaded.  You are losing weight. Get help right away if:  You have persistent and uncontrolled nausea and vomiting.  You pass out (faint). This information is not intended to replace advice given to you by your health care provider. Make sure you discuss any questions you have with your health care provider. Document Released: 10/22/2006 Document Revised: 02/06/2016 Document Reviewed: 02/15/2013 Elsevier Interactive Patient Education  2017 [employer].

## 2019-05-31 ENCOUNTER — Encounter (HOSPITAL_COMMUNITY): Payer: Self-pay

## 2020-12-07 ENCOUNTER — Encounter (HOSPITAL_COMMUNITY): Payer: Self-pay

## 2020-12-07 ENCOUNTER — Ambulatory Visit (HOSPITAL_COMMUNITY)
Admission: EM | Admit: 2020-12-07 | Discharge: 2020-12-07 | Disposition: A | Discharge status: Home / Self Care | Payer: Medicaid Other

## 2020-12-07 ENCOUNTER — Other Ambulatory Visit: Payer: Self-pay

## 2020-12-07 DIAGNOSIS — L02412 Cutaneous abscess of left axilla: Secondary | ICD-10-CM | POA: Diagnosis not present

## 2020-12-07 MED ORDER — SULFAMETHOXAZOLE-TRIMETHOPRIM 800-160 MG PO TABS: 1.0000 | ORAL_TABLET | Freq: Two times a day (BID) | ORAL | 0 refills | Status: AC

## 2020-12-07 NOTE — ED Provider Notes (Signed)
[facility]    CSN: [id number] Arrival date & time: 12/07/20  1736      History   Chief Complaint Chief Complaint  Patient presents with  . Lump under arm    HPI Linda Barry is a 30 y.o. female.   Patient presents with a painful lump under her left arm x1 week.  She states this is similar to previous abscess in the area.  No wounds or drainage.  She denies fever, chills, numbness, weakness, paresthesias, or other symptoms.  No treatments attempted at home.  Her medical history includes depression.  The history is provided by the patient and medical records.    Past Medical History:  Diagnosis Date  . Depression   . Placenta abruption, delivered, current hospitalization 04/15/2012  . Sickle cell trait (HCC)   . SVD (spontaneous vaginal delivery) 04/15/2012    Patient Active Problem List   Diagnosis Date Noted  . Indication for care in labor and delivery, antepartum 08/26/2017  . NSVD (normal spontaneous vaginal delivery) 08/26/2017  . Normal pregnancy 04/15/2012  . SVD (spontaneous vaginal delivery) 04/15/2012  . Placenta abruption, delivered, current hospitalization 04/15/2012    Past Surgical History:  Procedure Laterality Date  . ORIF ANKLE FRACTURE Left 02/28/2016   Procedure: OPEN REDUCTION INTERNAL FIXATION (ORIF) LEFT ANKLE FRACTURE;  Surgeon: Samson Frederic, MD;  [facility];  Service: Orthopedics;  Laterality: Left;    OB History    Gravida  4   Para  3   Term  3   Preterm      AB      Living  3     SAB      IAB      Ectopic      Multiple  0   Live Births  3            Home Medications    Prior to Admission medications   Medication Sig Start Date End Date Taking? Authorizing Provider  sulfamethoxazole-trimethoprim (BACTRIM DS) 800-160 MG tablet Take 1 tablet by mouth 2 (two) times daily for 7 days. 12/07/20 12/14/20 Yes Mickie Bail, NP  acetaminophen (TYLENOL) 500 MG tablet Take 500 mg by mouth every 6 (six) hours as  needed.    [provider]  Prenatal Vit-Fe Fumarate-FA (PRENATAL VITAMIN PLUS LOW IRON) 27-1 MG TABS Take 1 tablet by mouth daily. 07/20/18   Armando Reichert, CNM  promethazine (PHENERGAN) 25 MG tablet Take 0.5-1 tablets (12.5-25 mg total) by mouth every 6 (six) hours as needed. 07/20/18   Armando Reichert, CNM  [facility] 150-35 MCG/24HR transdermal patch SMARTSIG:Topical 11/11/20   [provider]    Family History Family History  Problem Relation Age of Onset  . Other Neg Hx     Social History Social History   Tobacco Use  . Smoking status: Former Smoker    Packs/day: 0.25    Types: Cigarettes    Quit date: 02/01/2012    Years since quitting: 8.8  . Smokeless tobacco: Never Used  Substance Use Topics  . Alcohol use: No  . Drug use: No     Allergies   Latex   Review of Systems Review of Systems  Constitutional: Negative for chills and fever.  HENT: Negative for ear pain and sore throat.   Eyes: Negative for pain and visual disturbance.  Respiratory: Negative for cough and shortness of breath.   Cardiovascular: Negative for chest pain and palpitations.  Gastrointestinal: Negative for abdominal pain  and vomiting.  Genitourinary: Negative for dysuria and hematuria.  Musculoskeletal: Negative for arthralgias and back pain.  Skin: Positive for wound. Negative for color change.  Neurological: Negative for syncope, weakness and numbness.  All other systems reviewed and are negative.    Physical Exam Triage Vital Signs ED Triage Vitals  Enc Vitals Group     BP      Pulse      Resp      Temp      Temp src      SpO2      Weight      Height      Head Circumference      Peak Flow      Pain Score      Pain Loc      Pain Edu?      Excl. in GC?    No data found.  Updated Vital Signs BP (!) 145/74   Pulse 80   Temp 99 F (37.2 C)   Resp 19   LMP 12/06/2020   SpO2 98%   Breastfeeding No   Visual Acuity Right Eye Distance:   Left Eye  Distance:   Bilateral Distance:    Right Eye Near:   Left Eye Near:    Bilateral Near:     Physical Exam Vitals and nursing note reviewed.  Constitutional:      General: She is not in acute distress.    Appearance: She is well-developed.  HENT:     Head: Normocephalic and atraumatic.     Mouth/Throat:     Mouth: Mucous membranes are moist.  Eyes:     Conjunctiva/sclera: Conjunctivae normal.  Cardiovascular:     Rate and Rhythm: Normal rate and regular rhythm.     Heart sounds: Normal heart sounds.  Pulmonary:     Effort: Pulmonary effort is normal. No respiratory distress.     Breath sounds: Normal breath sounds.  Abdominal:     Palpations: Abdomen is soft.     Tenderness: There is no abdominal tenderness.  Musculoskeletal:        General: Normal range of motion.     Cervical back: Neck supple.  Skin:    General: Skin is warm and dry.     Findings: Lesion present.     Comments: Abscess in left axilla approximately 6 x 4 cm; firm and tender; nonfluctuant.  Neurological:     General: No focal deficit present.     Mental Status: She is alert and oriented to person, place, and time.     Sensory: No sensory deficit.     Motor: No weakness.     Gait: Gait normal.  Psychiatric:        Mood and Affect: Mood normal.        Behavior: Behavior normal.      UC Treatments / Results  Labs (all labs ordered are listed, but only abnormal results are displayed) Labs Reviewed - No data to display  EKG   Radiology No results found.  Procedures Incision and Drainage  Date/Time: 12/07/2020 6:39 PM Performed by: Mickie Bail, NP Authorized by: Mickie Bail, NP   Consent:    Consent obtained:  Verbal   Consent given by:  Patient   Risks discussed:  Bleeding, incomplete drainage, infection and pain Universal protocol:    Procedure explained and questions answered to patient or proxy's satisfaction: yes   Location:    Type:  Abscess   Size:  Approx  6 x 4  cm Pre-procedure details:    Skin preparation:  Povidone-iodine Anesthesia:    Anesthesia method:  Local infiltration   Local anesthetic:  Lidocaine 2% w/o epi Procedure details:    Needle aspiration: yes     Needle size:  22 G   Incision types:  Single straight   Drainage:  Bloody   Drainage amount:  Scant   Wound treatment:  Wound left open Post-procedure details:    Procedure completion:  Tolerated well, no immediate complications   (including critical care time)  Medications Ordered in UC Medications - No data to display  Initial Impression / Assessment and Plan / UC Course  I have reviewed the triage vital signs and the nursing notes.  Pertinent labs & imaging results that were available during my care of the patient were reviewed by me and considered in my medical decision making (see chart for details).   Abscess of left axilla.  Attempted I&D with scant return.  Treating with Septra DS.  Education about abscess provided.  Instructed patient to follow-up with general surgeon next week.  She agrees to plan of care.   Final Clinical Impressions(s) / UC Diagnoses   Final diagnoses:  Abscess of axilla, left     Discharge Instructions     Take the antibiotic as directed.  Encourage drainage from the abscess.  Schedule an appointment with a general surgeon such as the one listed below if your symptoms are not improving.        ED Prescriptions    Medication Sig Dispense Auth. Provider   sulfamethoxazole-trimethoprim (BACTRIM DS) 800-160 MG tablet Take 1 tablet by mouth 2 (two) times daily for 7 days. 14 tablet Mickie Bail, NP     PDMP not reviewed this encounter.   Mickie Bail, NP 12/07/20 [phone]

## 2020-12-07 NOTE — Discharge Instructions (Addendum)
Take the antibiotic as directed.  Encourage drainage from the abscess.  Schedule an appointment with a general surgeon such as the one listed below if your symptoms are not improving.

## 2020-12-07 NOTE — ED Triage Notes (Signed)
Pt in with c/o lump under left arm that she noticed a few days ago  States that she hasn't noticed any drainage

## 2021-06-17 ENCOUNTER — Ambulatory Visit: Payer: PRIVATE HEALTH INSURANCE | Admitting: Family Medicine

## 2021-06-17 VITALS — BP 108/68 | HR 67 | Ht 66.0 in | Wt 205.0 lb

## 2021-06-17 DIAGNOSIS — L308 Other specified dermatitis: Secondary | ICD-10-CM

## 2021-06-17 MED ORDER — TRIAMCINOLONE ACETONIDE 0.1 % EX CREA: TOPICAL_CREAM | CUTANEOUS | 1 refills | Status: DC

## 2021-06-17 NOTE — Progress Notes (Signed)
  Subjective:     Patient ID: Linda Barry, female   DOB: 26-Nov-1990, 30 y.o.   MRN: [id number]  HPI Linda Barry [facility] today for evaluation of a rash to bilateral hands and her required wellness visit for her insurance. She sees her OBGYN, Dr. Ellyn Hack, yearly and her pap smear is UTD. She states she is trying to walk more for exercise and does okay with eating healthy but would like to make improvements.  She reports hx of eczema, has dry scaling patches to bilateral hands and fingers. States the gloves she wears at work breaks her out. She is wearing cotton gloves underneath the ones she wears at work and that has helped some.   Review of Systems  Constitutional:  Negative for chills, fatigue, fever and unexpected weight change.  HENT:  Negative for congestion, ear pain, sinus pressure, sinus pain and sore throat.   Eyes:  Negative for discharge and visual disturbance.  Respiratory:  Negative for cough, shortness of breath and wheezing.   Cardiovascular:  Negative for chest pain and leg swelling.  Gastrointestinal:  Negative for abdominal pain, blood in stool, constipation, diarrhea, nausea and vomiting.  Genitourinary:  Negative for difficulty urinating and hematuria.  Skin:  Positive for rash.  Neurological:  Negative for dizziness, weakness, light-headedness and headaches.  Hematological:  Negative for adenopathy.  All other systems reviewed and are negative.     Objective:   Physical Exam Vitals reviewed.  Constitutional:      General: She is not in acute distress.    Appearance: Normal appearance. She is well-developed.  HENT:     Head: Normocephalic and atraumatic.  Eyes:     General:        Right eye: No discharge.        Left eye: No discharge.  Cardiovascular:     Rate and Rhythm: Normal rate and regular rhythm.     Heart sounds: Normal heart sounds.  Pulmonary:     Effort: Pulmonary effort is normal. No respiratory distress.     Breath  sounds: Normal breath sounds.  Musculoskeletal:     Cervical back: Neck supple.  Skin:    General: Skin is warm and dry.     Findings: Rash present.     Comments: Patches of scaling and mild erythema noted to bilateral hands and fingers. Mildly pruritic. No evidence of infection.   Neurological:     Mental Status: She is alert and oriented to person, place, and time.  Psychiatric:        Mood and Affect: Mood normal.        Behavior: Behavior normal.   Today's Vitals   06/17/21 1343  BP: 108/68  Pulse: 67  SpO2: 100%  Weight: 205 lb (93 kg)  Height: 5\' 6"  (1.676 m)   Body mass index is 33.09 kg/m.     Assessment:     Other eczema     Plan:     Will trial triamcinolone cream to affected areas. Recommend continuing to use a barrier between her skin and the gloves at work. Keep skin well hydrated between hand washing. F/u prn.

## 2022-06-11 ENCOUNTER — Ambulatory Visit: Payer: PRIVATE HEALTH INSURANCE | Admitting: Nurse Practitioner

## 2022-06-11 ENCOUNTER — Encounter: Payer: Self-pay | Admitting: Nurse Practitioner

## 2022-06-11 VITALS — BP 112/77 | HR 75

## 2022-06-11 DIAGNOSIS — L308 Other specified dermatitis: Secondary | ICD-10-CM

## 2022-06-11 DIAGNOSIS — L309 Dermatitis, unspecified: Secondary | ICD-10-CM | POA: Insufficient documentation

## 2022-06-11 DIAGNOSIS — Z789 Other specified health status: Secondary | ICD-10-CM

## 2022-06-11 MED ORDER — TRIAMCINOLONE ACETONIDE 0.1 % EX CREA: TOPICAL_CREAM | CUTANEOUS | 1 refills | Status: DC

## 2022-06-11 NOTE — Progress Notes (Signed)
Office visit  Subjective:  Patient ID: Linda Barry, female    DOB: 09/01/1991  Age: 31 y.o. MRN: [id number]  CC: wellness exam   HPI Tekelia Kareem presents for wellness exam visit for insurance benefit.  Patient has a PCP[facility]  PMH significant for: reports HLD and low iron  Last labs per PCP were completed: June 2023  Health Maintenance:  Pap Smear: June 2023 Denies family hx of breast or colon cancer.   Smoker: Former  Immunizations:  Tdap: 2018 COVID- x 2  Lifestyle: Diet-  No specific diet Exercise- stays pretty active at work.    Also requesting refill on kenalog cream for eczema today.    Past Medical History:  Diagnosis Date   Depression    Placenta abruption, delivered, current hospitalization 04/15/2012   Sickle cell trait (HCC)    SVD (spontaneous vaginal delivery) 04/15/2012    Past Surgical History:  Procedure Laterality Date   ORIF ANKLE FRACTURE Left 02/28/2016   Procedure: OPEN REDUCTION INTERNAL FIXATION (ORIF) LEFT ANKLE FRACTURE;  Surgeon: Rod Can, MD;  Location: [place];  Service: Orthopedics;  Laterality: Left;    Outpatient Medications Prior to Visit  Medication Sig Dispense Refill   ESTARYLLA 0.25-35 MG-MCG tablet Take 1 tablet by mouth daily.     triamcinolone cream (KENALOG) 0.1 % Apply a thin layer to affected area bid x 7-10 days. 30 g 1   acetaminophen (TYLENOL) 500 MG tablet Take 500 mg by mouth every 6 (six) hours as needed. (Patient not taking: Reported on 06/17/2021)     Prenatal Vit-Fe Fumarate-FA (PRENATAL VITAMIN PLUS LOW IRON) 27-1 MG TABS Take 1 tablet by mouth daily. (Patient not taking: Reported on 06/17/2021) 30 tablet 11   promethazine (PHENERGAN) 25 MG tablet Take 0.5-1 tablets (12.5-25 mg total) by mouth every 6 (six) hours as needed. (Patient not taking: Reported on 06/17/2021) 30 tablet 3   XULANE 150-35 MCG/24HR transdermal patch SMARTSIG:Topical (Patient not taking: Reported on  06/17/2021)     No facility-administered medications prior to visit.    ROS Review of Systems  Respiratory:  Negative for shortness of breath.   Cardiovascular:  Negative for chest pain and leg swelling.  Allergic/Immunologic: Positive for environmental allergies.    Objective:  BP 112/77   Pulse 75   SpO2 98%   Physical Exam Constitutional:      General: She is not in acute distress. HENT:     Head: Normocephalic.  Cardiovascular:     Rate and Rhythm: Normal rate and regular rhythm.     Pulses: Normal pulses.     Heart sounds: Normal heart sounds.  Pulmonary:     Effort: Pulmonary effort is normal.     Breath sounds: Normal breath sounds.  Musculoskeletal:        General: Normal range of motion.     Right lower leg: No edema.     Left lower leg: No edema.  Skin:    General: Skin is warm and dry.     Findings: Lesion present.     Comments: Dry maculopapular rash on bilateral hands, fingers. No erythema.   Neurological:     General: No focal deficit present.     Mental Status: She is alert and oriented to person, place, and time.  Psychiatric:        Mood and Affect: Mood normal.        Behavior: Behavior normal.      Assessment & Plan:  Maris was seen today for wellness exam.  Diagnoses and all orders for this visit:  Participant in health and wellness plan Adult wellness physical was conducted today. Importance of diet and exercise were discussed in detail.  We reviewed immunizations and gave recommendations regarding current immunization needed for age.  Preventative health exams are up to date.   Patient was advised yearly wellness exam  Other eczema -     triamcinolone cream (KENALOG) 0.1 %; Apply a thin layer to affected area bid x 7-10 days.    No orders of the defined types were placed in this encounter.   Meds ordered this encounter  Medications   triamcinolone cream (KENALOG) 0.1 %    Sig: Apply a thin layer to affected area bid x 7-10  days.    Dispense:  30 g    Refill:  1    Follow-up: as needed.

## 2022-09-14 NOTE — L&D Delivery Note (Signed)
OB/[facility]N Faculty Practice Delivery Note  Linda Barry is a 32 y.o. [phone] s/p preterm SVD at [redacted]w[redacted]d. She was admitted for DKA and transitioned to L&D when found to be in PTL and complete cervical exam with bulging bag.   ROM: 0h 3m with clear fluid GBS Status: unknown, on PCN since 2225 11/26 (~1.5hr prior to delivery)   Maximum Maternal Temperature: 98.84F  Labor Progress: Initial SVE: Complete with bulging bag of water. She then progressed to complete.   Delivery Date/Time: 2356 11/26 Delivery: Called to room that patient PPROM after getting epidural. As I entered the room nursing was lifting the infant from in between the legs of pt.  Per nursing report, head delivered DOA and restituted to maternal left. No nuchal cord present. Shoulder and body delivered in usual fashion. Infant with spontaneous cry, placed on mother's abdomen, dried and suctioned. Cord clamped x 2 after 1-minute delay, and cut by FOB. Cord blood drawn along with cord gases for both arterial and venous. Placenta delivered spontaneously with gentle cord traction but with intermittent clotting and clots following placenta. Fundus firm with massage, lower uterine sweep, 1g Rectal Cytotec and Pitocin. Labia, perineum, vagina, and cervix inspected without laceration. Baby did require intubation and transport to NICU.  Mom doing well.    Baby Weight: pending  Placenta: 3 vessel, intact. Sent to pathology Complications: PPROM and PTD  Lacerations: none EBL: 621 mL Analgesia: Epidural   Infant:  APGAR (1 MIN):  pending APGAR (5 MINS):  pending   Hessie Dibble, MD [facility] Fellow, [facility] for [facility], [facility] 08/11/2023, 12:31 AM

## 2023-05-01 ENCOUNTER — Inpatient Hospital Stay (HOSPITAL_COMMUNITY)
Admission: AD | Admit: 2023-05-01 | Discharge: 2023-05-01 | Disposition: A | Discharge status: Home / Self Care | Payer: PRIVATE HEALTH INSURANCE | Attending: Obstetrics and Gynecology | Admitting: Obstetrics and Gynecology

## 2023-05-01 ENCOUNTER — Encounter (HOSPITAL_COMMUNITY): Payer: Self-pay

## 2023-05-01 ENCOUNTER — Inpatient Hospital Stay (HOSPITAL_COMMUNITY): Payer: PRIVATE HEALTH INSURANCE

## 2023-05-01 DIAGNOSIS — D573 Sickle-cell trait: Secondary | ICD-10-CM | POA: Diagnosis not present

## 2023-05-01 DIAGNOSIS — O26891 Other specified pregnancy related conditions, first trimester: Secondary | ICD-10-CM

## 2023-05-01 DIAGNOSIS — R1030 Lower abdominal pain, unspecified: Secondary | ICD-10-CM | POA: Diagnosis present

## 2023-05-01 DIAGNOSIS — O210 Mild hyperemesis gravidarum: Secondary | ICD-10-CM | POA: Insufficient documentation

## 2023-05-01 DIAGNOSIS — Z3491 Encounter for supervision of normal pregnancy, unspecified, first trimester: Secondary | ICD-10-CM

## 2023-05-01 DIAGNOSIS — O99011 Anemia complicating pregnancy, first trimester: Secondary | ICD-10-CM | POA: Insufficient documentation

## 2023-05-01 DIAGNOSIS — Z3A12 12 weeks gestation of pregnancy: Secondary | ICD-10-CM | POA: Diagnosis not present

## 2023-05-01 DIAGNOSIS — O26899 Other specified pregnancy related conditions, unspecified trimester: Secondary | ICD-10-CM

## 2023-05-01 DIAGNOSIS — R109 Unspecified abdominal pain: Secondary | ICD-10-CM | POA: Insufficient documentation

## 2023-05-01 DIAGNOSIS — R112 Nausea with vomiting, unspecified: Secondary | ICD-10-CM | POA: Diagnosis present

## 2023-05-01 LAB — CBC
HCT: 37.4 % (ref 36.0–46.0)
Hemoglobin: 13 g/dL (ref 12.0–15.0)
MCH: 26.3 pg (ref 26.0–34.0)
MCHC: 34.8 g/dL (ref 30.0–36.0)
MCV: 75.6 fL — ABNORMAL LOW (ref 80.0–100.0)
Platelets: 299 10*3/uL (ref 150–400)
RBC: 4.95 MIL/uL (ref 3.87–5.11)
RDW: 13.7 % (ref 11.5–15.5)
WBC: 13.9 10*3/uL — ABNORMAL HIGH (ref 4.0–10.5)
nRBC: 0 % (ref 0.0–0.2)

## 2023-05-01 LAB — WET PREP, GENITAL
Sperm: NONE SEEN
Trich, Wet Prep: NONE SEEN
WBC, Wet Prep HPF POC: <10 (ref <10)
Yeast Wet Prep HPF POC: NONE SEEN

## 2023-05-01 LAB — URINALYSIS, ROUTINE W REFLEX MICROSCOPIC
Bacteria, UA: NONE SEEN
Bilirubin Urine: NEGATIVE
Glucose, UA: NEGATIVE mg/dL
Ketones, ur: NEGATIVE mg/dL
Leukocytes,Ua: NEGATIVE
Nitrite: NEGATIVE
Protein, ur: NEGATIVE mg/dL
Specific Gravity, Urine: 1.009 (ref 1.005–1.030)
pH: 6 (ref 5.0–8.0)

## 2023-05-01 LAB — HCG, QUANTITATIVE, PREGNANCY: hCG, Beta Chain, Quant, S: 79920 m[IU]/mL — ABNORMAL HIGH (ref <5)

## 2023-05-01 LAB — POCT PREGNANCY, URINE: Preg Test, Ur: POSITIVE — AB

## 2023-05-01 MED ORDER — PROMETHAZINE HCL 25 MG PO TABS: 25.0000 mg | ORAL_TABLET | Freq: Four times a day (QID) | ORAL | 0 refills | Status: DC | PRN

## 2023-05-01 MED ORDER — ONDANSETRON 4 MG PO TBDP
4.0000 mg | ORAL_TABLET | Freq: Once | ORAL | Status: AC
  Administered 2023-05-01: 4 mg via ORAL
  Filled 2023-05-01: qty 1

## 2023-05-01 MED ORDER — ONDANSETRON 4 MG PO TBDP: 4.0000 mg | ORAL_TABLET | Freq: Three times a day (TID) | ORAL | 0 refills | Status: DC | PRN

## 2023-05-01 NOTE — MAU Provider Note (Signed)
History     CSN: [id number]  Arrival date and time: 05/01/23 1245   None     Chief Complaint  Patient presents with   Abdominal Pain   Nausea   Emesis   HPI Linda Barry is a 32 y.o. G5P3003 at [redacted]w[redacted]d by LMP who presents to MAU for lower abdominal pain. She reports intermittent lower abdominal cramping since July 9th. She had some spotting back in July as well, however has not had any since. She denies itching, odor, urinary s/s, constipation, or diarrhea. She reports some nausea but has only had 2 episodes of vomiting in the past 24 hours. No heartburn. This is an unplanned pregnancy. MP 03/08/2023.   She has not established [facility] but is planning to go to [facility].    OB History     Gravida  4   Para  3   Term  3   Preterm      AB      Living  3      SAB      IAB      Ectopic      Multiple  0   Live Births  3           Past Medical History:  Diagnosis Date   Depression    Placenta abruption, delivered, current hospitalization 04/15/2012   Sickle cell trait (HCC)    SVD (spontaneous vaginal delivery) 04/15/2012    Past Surgical History:  Procedure Laterality Date   ORIF ANKLE FRACTURE Left 02/28/2016   Procedure: OPEN REDUCTION INTERNAL FIXATION (ORIF) LEFT ANKLE FRACTURE;  Surgeon: Samson Frederic, MD;  Location[facility];  Service: Orthopedics;  Laterality: Left;    Family History  Problem Relation Age of Onset   Other Neg Hx     Social History   Tobacco Use   Smoking status: Former    Current packs/day: 0.00    Types: Cigarettes    Quit date: 02/01/2012    Years since quitting: 11.2   Smokeless tobacco: Never  Substance Use Topics   Alcohol use: No   Drug use: No    Allergies:  Allergies  Allergen Reactions   Latex Other (See Comments)    Gloves irritate eczema on hands, no problems elsewhere per patient    No medications prior to admission.   Review of Systems  Gastrointestinal:  Positive for abdominal pain and nausea.   All other systems reviewed and are negative.  Physical Exam   Blood pressure 126/83, pulse 92, temperature 98.4 F (36.9 C), temperature source Oral, resp. rate 14, last menstrual period 03/08/2023, SpO2 100%.  Physical Exam Vitals and nursing note reviewed.  Constitutional:      General: She is not in acute distress. Cardiovascular:     Rate and Rhythm: Normal rate.  Pulmonary:     Effort: Pulmonary effort is normal. No respiratory distress.  Abdominal:     Palpations: Abdomen is soft.     Tenderness: There is no abdominal tenderness.  Genitourinary:    Comments: Patient self swabbed Skin:    General: Skin is warm and dry.  Neurological:     General: No focal deficit present.     Mental Status: She is alert and oriented to person, place, and time.  Psychiatric:        Mood and Affect: Mood normal.        Behavior: Behavior normal.    Results for orders placed or performed during the hospital encounter of 05/01/23 (  from the past 24 hour(s))  Pregnancy, urine POC     Status: Abnormal   Collection Time: 05/01/23  1:18 PM  Result Value Ref Range   Preg Test, Ur POSITIVE (A) NEGATIVE  Urinalysis, Routine w reflex microscopic -Urine, Clean Catch     Status: Abnormal   Collection Time: 05/01/23  1:37 PM  Result Value Ref Range   Color, Urine STRAW (A) YELLOW   APPearance CLEAR CLEAR   Specific Gravity, Urine 1.009 1.005 - 1.030   pH 6.0 5.0 - 8.0   Glucose, UA NEGATIVE NEGATIVE mg/dL   Hgb urine dipstick MODERATE (A) NEGATIVE   Bilirubin Urine NEGATIVE NEGATIVE   Ketones, ur NEGATIVE NEGATIVE mg/dL   Protein, ur NEGATIVE NEGATIVE mg/dL   Nitrite NEGATIVE NEGATIVE   Leukocytes,Ua NEGATIVE NEGATIVE   RBC / HPF 0-5 0 - 5 RBC/hpf   WBC, UA 0-5 0 - 5 WBC/hpf   Bacteria, UA NONE SEEN NONE SEEN   Squamous Epithelial / HPF 0-5 0 - 5 /HPF   Mucus PRESENT   Wet prep, genital     Status: Abnormal   Collection Time: 05/01/23  1:38 PM   Specimen: Vaginal  Result Value Ref  Range   Yeast Wet Prep HPF POC NONE SEEN NONE SEEN   Trich, Wet Prep NONE SEEN NONE SEEN   Clue Cells Wet Prep HPF POC PRESENT (A) NONE SEEN   WBC, Wet Prep HPF POC <10 <10   Sperm NONE SEEN   CBC     Status: Abnormal   Collection Time: 05/01/23  1:44 PM  Result Value Ref Range   WBC 13.9 (H) 4.0 - 10.5 K/uL   RBC 4.95 3.87 - 5.11 MIL/uL   Hemoglobin 13.0 12.0 - 15.0 g/dL   HCT 09.8 11.9 - 14.7 %   MCV 75.6 (L) 80.0 - 100.0 fL   MCH 26.3 26.0 - 34.0 pg   MCHC 34.8 30.0 - 36.0 g/dL   RDW 82.9 56.2 - 13.0 %   Platelets 299 150 - 400 K/uL   nRBC 0.0 0.0 - 0.2 %  hCG, quantitative, pregnancy     Status: Abnormal   Collection Time: 05/01/23  1:44 PM  Result Value Ref Range   hCG, Beta Chain, Quant, S 79,920 (H) <5 mIU/mL   US OB LESS THAN 14 WEEKS WITH OB TRANSVAGINAL  Result Date: 05/01/2023 CLINICAL DATA:  Abdominal pain EXAM: OBSTETRIC <14 WK [place] AND TRANSVAGINAL OB US TECHNIQUE: Both transabdominal and transvaginal ultrasound examinations were performed for complete evaluation of the gestation as well as the maternal uterus, adnexal regions, and pelvic cul-de-sac. Transvaginal technique was performed to assess early pregnancy. COMPARISON:  None Available. FINDINGS: Intrauterine gestational sac: Single Yolk sac:  Visualized Embryo:  Visualized Cardiac Activity: Visualized Heart Rate: 161  bpm CRL: 54.7  mm   12 w   0 d                  [place] EDC: 11/13/2023 Subchorionic hemorrhage:  None visualized. Maternal uterus/adnexae: No adnexal mass. Ovaries are not visualized. IMPRESSION: Single live intrauterine pregnancy as described above. Electronically Signed   By: Elige Ko M.D.   On: 05/01/2023 16:14    MAU Course  Procedures  MDM UA, CBC, HCG Wet prep, GC/CT [place]  UA with some hgb but otherwise normal. Patient asymptomatic. Will add urine culture. Wet prep negative, GC/CT pending. Labs reassuring. US shows single living IUP at [redacted]w[redacted]d.   Assessment and Plan   1. [redacted]  weeks gestation of  pregnancy   2. Abdominal pain affecting pregnancy   3. Morning sickness   4. Normal IUP (intrauterine pregnancy) on prenatal ultrasound, first trimester    - Discharge home in stable condition - Rx for Zofran and Phenergan - List of safe meds and return precautions given - Establish prenatal care  Brand Males, CNM 05/01/2023, 4:46 PM

## 2023-05-01 NOTE — Discharge Instructions (Addendum)
Safe Medications in Pregnancy    Acne: Benzoyl Peroxide Salicylic Acid  Backache/Headache: Tylenol: 2 regular strength every 4 hours OR              2 Extra strength every 6 hours  Colds/Coughs/Allergies: Benadryl (alcohol free) 25 mg every 6 hours as needed Breath right strips Claritin Cepacol throat lozenges Chloraseptic throat spray Cold-Eeze- up to three times per day Cough drops, alcohol free Flonase (by prescription only) Guaifenesin Mucinex Robitussin DM (plain only, alcohol free) Saline nasal spray/drops Sudafed (pseudoephedrine) & Actifed ** use only after [redacted] weeks gestation and if you do not have high blood pressure Tylenol Vicks Vaporub Zinc lozenges Zyrtec   Constipation: Colace Ducolax suppositories Fleet enema Glycerin suppositories Metamucil Milk of magnesia Miralax Senokot Smooth move tea  Diarrhea: Kaopectate Imodium A-D  *NO pepto Bismol  Hemorrhoids: Anusol Anusol HC Preparation H Tucks  Indigestion: Tums Maalox Mylanta Zantac  Pepcid  Insomnia: Benadryl (alcohol free) 25mg every 6 hours as needed Tylenol PM Unisom, no Gelcaps  Leg Cramps: Tums MagGel  Nausea/Vomiting:  Bonine Dramamine Emetrol Ginger extract Sea bands Meclizine  Nausea medication to take during pregnancy:  Unisom (doxylamine succinate 25 mg tablets) Take one tablet daily at bedtime. If symptoms are not adequately controlled, the dose can be increased to a maximum recommended dose of two tablets daily (1/2 tablet in the morning, 1/2 tablet mid-afternoon and one at bedtime). Vitamin B6 100mg tablets. Take one tablet twice a day (up to 200 mg per day).  Skin Rashes: Aveeno products Benadryl cream or 25mg every 6 hours as needed Calamine Lotion 1% cortisone cream  Yeast infection: Gyne-lotrimin 7 Monistat 7   **If taking multiple medications, please check labels to avoid duplicating the same active ingredients **take  medication as directed on the label ** Do not exceed 4000 mg of tylenol in 24 hours **Do not take medications that contain aspirin or ibuprofen   [place] [facility] Providers   [facility]             [address], [place], [place] [address] [phone]  [facility]                                                             [address], [address], [place], [place], [address] [phone]  [facility] [place]                                    [address], [address], [place], [place], [address] [phone]  [facility] [place] [address], [address], [place], [place], [address] [phone]  [facility] [place]                                 [address], [place], , [address] [phone]  [facility]                                      [address], [place], [place], [address] [phone]  [facility] [address] [address], [address], [place], [place], [address]                              [facility] [address], [place], [place], [address] [phone]  [facility]         Phone: [phone]  [facility]      Phone: [phone]   [facility]      Phone: [phone]  [facility]         Phone: [phone]   [facility]    Phone: [phone]  [facility]      Phone: [phone]  [facility]     Phone: [phone]  [facility]        Phone: [phone]  [facility]      Phone: [phone]  

## 2023-05-01 NOTE — MAU Note (Signed)
.  Linda Barry is a 32 y.o. at [redacted]w[redacted]d here in MAU reporting: since July 9 she started having lower abdominal cramping. Denies VB or abnormal discharge. Also reports n/v that started then as well. States she has thrown up x2 in the last 24 hrs.   Pain score: 8 Vitals:   05/01/23 1317  BP: 126/83  Pulse: 92  Resp: 14  Temp: 98.4 F (36.9 C)  SpO2: 100%      Lab orders placed from triage:  UPT, UA

## 2023-05-02 LAB — CULTURE, OB URINE: Culture: NO GROWTH

## 2023-05-03 LAB — GC/CHLAMYDIA PROBE AMP (~~LOC~~) NOT AT ARMC
Chlamydia: NEGATIVE
Comment: NEGATIVE
Comment: NORMAL
Neisseria Gonorrhea: NEGATIVE

## 2023-05-06 ENCOUNTER — Inpatient Hospital Stay (HOSPITAL_COMMUNITY)
Admission: AD | Admit: 2023-05-06 | Discharge: 2023-05-06 | Disposition: A | Discharge status: Home / Self Care | Payer: PRIVATE HEALTH INSURANCE | Attending: Obstetrics & Gynecology | Admitting: Obstetrics & Gynecology

## 2023-05-06 ENCOUNTER — Other Ambulatory Visit: Payer: Self-pay

## 2023-05-06 ENCOUNTER — Inpatient Hospital Stay (HOSPITAL_COMMUNITY): Payer: PRIVATE HEALTH INSURANCE

## 2023-05-06 DIAGNOSIS — B9689 Other specified bacterial agents as the cause of diseases classified elsewhere: Secondary | ICD-10-CM | POA: Insufficient documentation

## 2023-05-06 DIAGNOSIS — R1030 Lower abdominal pain, unspecified: Secondary | ICD-10-CM | POA: Insufficient documentation

## 2023-05-06 DIAGNOSIS — O99011 Anemia complicating pregnancy, first trimester: Secondary | ICD-10-CM | POA: Diagnosis not present

## 2023-05-06 DIAGNOSIS — N760 Acute vaginitis: Secondary | ICD-10-CM | POA: Insufficient documentation

## 2023-05-06 DIAGNOSIS — O4692 Antepartum hemorrhage, unspecified, second trimester: Secondary | ICD-10-CM

## 2023-05-06 DIAGNOSIS — D573 Sickle-cell trait: Secondary | ICD-10-CM | POA: Insufficient documentation

## 2023-05-06 DIAGNOSIS — Z3A12 12 weeks gestation of pregnancy: Secondary | ICD-10-CM | POA: Insufficient documentation

## 2023-05-06 DIAGNOSIS — Z87891 Personal history of nicotine dependence: Secondary | ICD-10-CM | POA: Diagnosis not present

## 2023-05-06 DIAGNOSIS — R109 Unspecified abdominal pain: Secondary | ICD-10-CM | POA: Diagnosis present

## 2023-05-06 DIAGNOSIS — O4691 Antepartum hemorrhage, unspecified, first trimester: Secondary | ICD-10-CM | POA: Diagnosis not present

## 2023-05-06 DIAGNOSIS — O26891 Other specified pregnancy related conditions, first trimester: Secondary | ICD-10-CM | POA: Insufficient documentation

## 2023-05-06 DIAGNOSIS — O26899 Other specified pregnancy related conditions, unspecified trimester: Secondary | ICD-10-CM

## 2023-05-06 LAB — URINALYSIS, ROUTINE W REFLEX MICROSCOPIC
Bacteria, UA: NONE SEEN
Bilirubin Urine: NEGATIVE
Glucose, UA: NEGATIVE mg/dL
Ketones, ur: NEGATIVE mg/dL
Leukocytes,Ua: NEGATIVE
Nitrite: NEGATIVE
Protein, ur: NEGATIVE mg/dL
Specific Gravity, Urine: 1.012 (ref 1.005–1.030)
pH: 6 (ref 5.0–8.0)

## 2023-05-06 MED ORDER — ACETAMINOPHEN 325 MG PO TABS
650.0000 mg | ORAL_TABLET | Freq: Once | ORAL | Status: AC
  Administered 2023-05-06: 650 mg via ORAL
  Filled 2023-05-06: qty 2

## 2023-05-06 MED ORDER — METRONIDAZOLE 500 MG PO TABS: 500.0000 mg | ORAL_TABLET | Freq: Two times a day (BID) | ORAL | 0 refills | Status: DC

## 2023-05-06 MED ORDER — CYCLOBENZAPRINE HCL 5 MG PO TABS
10.0000 mg | ORAL_TABLET | Freq: Once | ORAL | Status: AC
  Administered 2023-05-06: 10 mg via ORAL
  Filled 2023-05-06: qty 2

## 2023-05-06 MED ORDER — IBUPROFEN 800 MG PO TABS
800.0000 mg | ORAL_TABLET | Freq: Once | ORAL | Status: AC
  Administered 2023-05-06: 800 mg via ORAL
  Filled 2023-05-06: qty 1

## 2023-05-06 NOTE — MAU Provider Note (Signed)
History     CSN: [id number]  Arrival date and time: 05/06/23 1038   Event Date/Time   First Provider Initiated Contact with Patient 05/06/23 1341      Chief Complaint  Patient presents with   Abdominal Pain   Vaginal Bleeding    Linda Barry is a 32 y.o. [id number] at [redacted]w[redacted]d who receives care at [facility].  She presents today for abdominal cramping and vaginal bleeding.  She reports she started cramping around 10am.  She states initially she thought she had to use the restroom and noted blood on the tissue and in the toilet with wiping.  She states she used the bathroom upon arrival and noted a little blood on her pad. She denies passing of clots.  She rates the cramping a 10/10 and denies relieving or aggravating factors.   OB History     Gravida  4   Para  3   Term  3   Preterm      AB      Living  3      SAB      IAB      Ectopic      Multiple  0   Live Births  3           Past Medical History:  Diagnosis Date   Depression    Placenta abruption, delivered, current hospitalization 04/15/2012   Sickle cell trait (HCC)    SVD (spontaneous vaginal delivery) 04/15/2012    Past Surgical History:  Procedure Laterality Date   ORIF ANKLE FRACTURE Left 02/28/2016   Procedure: OPEN REDUCTION INTERNAL FIXATION (ORIF) LEFT ANKLE FRACTURE;  Surgeon: Samson Frederic, MD;  Location: [facility] OR;  Service: Orthopedics;  Laterality: Left;    Family History  Problem Relation Age of Onset   Other Neg Hx     Social History   Tobacco Use   Smoking status: Former    Current packs/day: 0.00    Types: Cigarettes    Quit date: 02/01/2012    Years since quitting: 11.2   Smokeless tobacco: Never  Substance Use Topics   Alcohol use: No   Drug use: No    Allergies:  Allergies  Allergen Reactions   Latex Other (See Comments)    Gloves irritate eczema on hands, no problems elsewhere per patient    Medications Prior to Admission  Medication Sig Dispense Refill Last Dose    ondansetron (ZOFRAN-ODT) 4 MG disintegrating tablet Take 1 tablet (4 mg total) by mouth every 8 (eight) hours as needed for nausea or vomiting. 15 tablet 0    promethazine (PHENERGAN) 25 MG tablet Take 1 tablet (25 mg total) by mouth every 6 (six) hours as needed for nausea or vomiting. 30 tablet 0    triamcinolone cream (KENALOG) 0.1 % Apply a thin layer to affected area bid x 7-10 days. 30 g 1     Review of Systems  Gastrointestinal:  Positive for abdominal pain and nausea (This AM, relieved with promethezine). Negative for vomiting.  Genitourinary:  Positive for vaginal bleeding. Negative for difficulty urinating, dysuria and vaginal discharge.  Neurological:  Negative for dizziness, light-headedness and headaches.   Physical Exam   Blood pressure 116/78, pulse 81, temperature 98 F (36.7 C), temperature source Oral, resp. rate 18, height 5\' 6"  (1.676 m), weight 91.4 kg, last menstrual period 03/08/2023, SpO2 100%.  Physical Exam Vitals reviewed.  Constitutional:      Appearance: Normal appearance. She is well-developed.  HENT:  Head: Normocephalic and atraumatic.  Eyes:     Conjunctiva/sclera: Conjunctivae normal.  Cardiovascular:     Rate and Rhythm: Normal rate.  Pulmonary:     Effort: Pulmonary effort is normal. No respiratory distress.  Abdominal:     Palpations: Abdomen is soft.  Musculoskeletal:        General: Normal range of motion.     Cervical back: Normal range of motion.  Skin:    General: Skin is warm and dry.  Neurological:     Mental Status: She is alert and oriented to person, place, and time.  Psychiatric:        Mood and Affect: Mood normal.        Behavior: Behavior normal.     MAU Course  Procedures Results for orders placed or performed during the hospital encounter of 05/06/23 (from the past 24 hour(s))  Urinalysis, Routine w reflex microscopic -Urine, Clean Catch     Status: Abnormal   Collection Time: 05/06/23 12:20 PM  Result Value Ref  Range   Color, Urine YELLOW YELLOW   APPearance CLEAR CLEAR   Specific Gravity, Urine 1.012 1.005 - 1.030   pH 6.0 5.0 - 8.0   Glucose, UA NEGATIVE NEGATIVE mg/dL   Hgb urine dipstick MODERATE (A) NEGATIVE   Bilirubin Urine NEGATIVE NEGATIVE   Ketones, ur NEGATIVE NEGATIVE mg/dL   Protein, ur NEGATIVE NEGATIVE mg/dL   Nitrite NEGATIVE NEGATIVE   Leukocytes,Ua NEGATIVE NEGATIVE   RBC / HPF 0-5 0 - 5 RBC/hpf   WBC, UA 6-10 0 - 5 WBC/hpf   Bacteria, UA NONE SEEN NONE SEEN   Squamous Epithelial / HPF 0-5 0 - 5 /HPF   Mucus PRESENT      Patient informed that the ultrasound is considered a limited OB ultrasound and is not intended to be a complete ultrasound exam.  Patient also informed that the ultrasound is not being completed with the intent of assessing for fetal or placental anomalies or any pelvic abnormalities.  Explained that the purpose of today's ultrasound is to assess for   reassurance and viability.  Patient acknowledges the purpose of the exam and the limitations of the study.  SIUP with good fetal movement FHR 154   US OB Comp Less 14 Wks  Result Date: 05/06/2023 CLINICAL DATA:  Pelvic pain, vaginal bleeding. EXAM: OBSTETRIC <14 WK ULTRASOUND TECHNIQUE: Transabdominal ultrasound was performed for evaluation of the gestation as well as the maternal uterus and adnexal regions. COMPARISON:  May 01, 2023. FINDINGS: Intrauterine gestational sac: Single Yolk sac:  Not Visualized. Embryo:  Visualized. Cardiac Activity: Visualized. Heart Rate: 158 bpm CRL: 64.9 mm 12 w 6 d [place] EDC: November 12, 2023. Subchorionic hemorrhage: Moderate size subchorionic hemorrhage is noted. Maternal uterus/adnexae: Ovaries unremarkable. No free fluid is noted. IMPRESSION: Single live intra gestation of 12 weeks 6 days. Moderate size subchorionic hemorrhage is noted. Electronically Signed   By: Lupita Raider M.D.   On: 05/06/2023 16:31    MDM BSUS Pain medication Prescription  Assessment and Plan   32 year old, G4P3003  SIUP at 12.5 weeks Vaginal Bleeding Abdominal Cramping  -Reviewed POC with patient. -Exam performed and findings discussed.  -BSUS performed and as above. -Patient offered and accepts pain medication. -Give flexeril and tylenol. -Instructed to change pad and monitor bleeding.  Cherre Robins 05/06/2023, 1:41 PM   Reassessment (2:33 PM) Patient reports pain improving, but only 8/10. -Patient offered additional medications and declines. -Will monitor and reassess.  Reassessment (2:53 PM) -Patient reports pain now 7/10. -Discussed additional medications and patient agreeable. -Will give dose ibuprofen.   Reassessment (3:45 PM) -Patient reports increased bleeding. -Pad evaluated and with moderate saturation. -Discussed sending for official US to evaluate placenta. -Patient agreeable. -Patient also reports pain now 5/10.  Reassessment (4:46 PM) -[place] returns as above. -Educated on [facility], what to expect including bleeding, risks for miscarriage, and resolution.  -Information provided in AVS. -Return precautions reviewed. -Encouraged to call primary office or return to MAU if symptoms worsen or with the onset of new symptoms. -Discharged to home in stable condition.  Cherre Robins MSN, CNM Advanced Practice Provider, [facility] [employer]

## 2023-05-06 NOTE — MAU Note (Signed)
Linda Barry is a 32 y.o. at [redacted]w[redacted]d here in MAU reporting: she's having abdominal cramping and VB.  Reports symptoms began this morning @ 1000.  Reports she's currently wearing a sanitary napkin.  Pad assessed by RN, VB noted to be light.  Denies passing clots LMP: NA Onset of complaint: today Pain score: 10 Vitals:   05/06/23 1152  BP: 116/78  Pulse: 81  Resp: 18  Temp: 98 F (36.7 C)  SpO2: 100%     FHT:155 bpm Lab orders placed from triage:   UA

## 2023-05-20 ENCOUNTER — Telehealth (INDEPENDENT_AMBULATORY_CARE_PROVIDER_SITE_OTHER): Payer: Medicaid Other

## 2023-05-20 DIAGNOSIS — Z3689 Encounter for other specified antenatal screening: Secondary | ICD-10-CM

## 2023-05-20 DIAGNOSIS — Z3480 Encounter for supervision of other normal pregnancy, unspecified trimester: Secondary | ICD-10-CM | POA: Insufficient documentation

## 2023-05-20 NOTE — Progress Notes (Signed)
New OB Intake  I connected with Linda Barry  on 05/20/23 at  3:15 PM EDT by MyChart Video Visit and verified that I am speaking with the correct person using two identifiers. Nurse is located at [facility] and pt is located at home.  I discussed the limitations, risks, security and privacy concerns of performing an evaluation and management service by telephone and the availability of in person appointments. I also discussed with the patient that there may be a patient responsible charge related to this service. The patient expressed understanding and agreed to proceed.  I explained I am completing New OB Intake today. We discussed EDD of 11/13/2023, by Ultrasound. Pt is G4P3003. I reviewed her allergies, medications and Medical/Surgical/OB history.    Patient Active Problem List   Diagnosis Date Noted   Supervision of other normal pregnancy, antepartum 05/20/2023   Eczema 06/11/2022   Indication for care in labor and delivery, antepartum 08/26/2017   NSVD (normal spontaneous vaginal delivery) 08/26/2017   Normal pregnancy 04/15/2012   SVD (spontaneous vaginal delivery) 04/15/2012   Placenta abruption, delivered, current hospitalization 04/15/2012    Concerns addressed today  Delivery Plans Plans to deliver at [facility]. Discussed the nature of our practice with multiple providers including residents and students. Due to the size of the practice, the delivering provider may not be the same as those providing prenatal care.   Patient is not interested in water birth. Offered upcoming OB visit with CNM to discuss further.  MyChart/Babyscripts MyChart access verified. I explained pt will have some visits in office and some virtually. Babyscripts instructions given and order placed. Patient verifies receipt of registration text/e-mail. Account successfully created and app downloaded.  Blood Pressure Cuff/Weight Scale Blood pressure cuff ordered for patient to pick-up from [employer].  Explained after first prenatal appt pt will check weekly and document in Babyscripts. Patient does have weight scale.  Anatomy US Explained first scheduled [place] will be around 19 weeks. Anatomy US scheduled for 06/25/23 at 0815a.  Is patient a CenteringPregnancy candidate?  Accepted   If accepted,    Is patient a Mom+Baby Combined Care candidate?  Not a candidate   If accepted, confirm patient does not intend to move from the area for at least 12 months, then notify Mom+Baby staff  Interested in [place]? If yes, send referral and doula dot phrase.   Is patient a candidate for Babyscripts Optimization? No - Centering Pt  First visit review I reviewed new OB appt with patient. Explained pt will be seen by Dr.Ervin at first visit. Discussed Avelina Laine genetic screening with patient. needs Panorama and Horizon.. Routine prenatal labs  needed at [facility] OB visit.    [facility] year, will reach out to [facility] to get records.  Henrietta Dine, CMA 05/20/2023  3:48 PM

## 2023-05-28 ENCOUNTER — Encounter: Payer: Self-pay | Admitting: Family Medicine

## 2023-05-28 ENCOUNTER — Encounter: Payer: Self-pay | Admitting: Obstetrics and Gynecology

## 2023-05-28 ENCOUNTER — Ambulatory Visit (INDEPENDENT_AMBULATORY_CARE_PROVIDER_SITE_OTHER): Payer: Medicaid Other | Admitting: Obstetrics and Gynecology

## 2023-05-28 ENCOUNTER — Other Ambulatory Visit: Payer: Self-pay

## 2023-05-28 VITALS — BP 120/78 | HR 93 | Wt 204.9 lb

## 2023-05-28 DIAGNOSIS — Z8759 Personal history of other complications of pregnancy, childbirth and the puerperium: Secondary | ICD-10-CM | POA: Diagnosis not present

## 2023-05-28 DIAGNOSIS — Z3A15 15 weeks gestation of pregnancy: Secondary | ICD-10-CM | POA: Diagnosis not present

## 2023-05-28 DIAGNOSIS — D573 Sickle-cell trait: Secondary | ICD-10-CM | POA: Diagnosis not present

## 2023-05-28 DIAGNOSIS — Z3480 Encounter for supervision of other normal pregnancy, unspecified trimester: Secondary | ICD-10-CM | POA: Diagnosis not present

## 2023-05-28 NOTE — Progress Notes (Signed)
Subjective:  Linda Barry is a 32 y.o. [phone] at [redacted]w[redacted]d being seen today for her first OB visit. EDD by first trimester U/S. Denies any chronic medical problems or medications. H/O placental abruption with first pregnancy. care.  She is currently monitored for the following issues for this low-risk pregnancy and has Eczema; Supervision of other normal pregnancy, antepartum; Sickle cell trait (HCC); and History of placenta abruption on their problem list.  Patient reports  occ spotting . [facility] noted on first trimester U/S  . Vag. Bleeding: Scant.  Movement: Absent. Denies leaking of fluid.   The following portions of the patient's history were reviewed and updated as appropriate: allergies, current medications, past family history, past medical history, past social history, past surgical history and problem list. Problem list updated.  Objective:   Vitals:   05/28/23 0946  BP: 120/78  Pulse: 93  Weight: 204 lb 14.4 oz (92.9 kg)    Fetal Status: Fetal Heart Rate (bpm): 155   Movement: Absent     General:  Alert, oriented and cooperative. Patient is in no acute distress.  Skin: Skin is warm and dry. No rash noted.   Cardiovascular: Normal heart rate noted  Respiratory: Normal respiratory effort, no problems with respiration noted  Abdomen: Soft, gravid, appropriate for gestational age. Pain/Pressure: Present     Pelvic:  Cervical exam deferred        Extremities: Normal range of motion.  Edema: None  Mental Status: Normal mood and affect. Normal behavior. Normal judgment and thought content.   Urinalysis:      Assessment and Plan:  Pregnancy: G4P3003 at [redacted]w[redacted]d  1. Supervision of other normal pregnancy, antepartum Prenatal care and labs reviewed with pt Genetic testing discussed Pt reassured in regards to spotting. Bleeding precautions reviewed Declined Flu vaccine Work restrictions note provided at check out - PANORAMA PRENATAL TEST - HORIZON Basic Panel -  CBC/D/Plt+RPR+Rh+ABO+RubIgG... - Hemoglobin A1c  2. Sickle cell trait (HCC) Test kit for FOB provided  3. History of placenta abruption Stable 2 TSVD afterwards without problems  Preterm labor symptoms and general obstetric precautions including but not limited to vaginal bleeding, contractions, leaking of fluid and fetal movement were reviewed in detail with the patient. Please refer to After Visit Summary for other counseling recommendations.  Return in about 4 weeks (around 06/25/2023) for OB visit, face to face, any provider.   Hermina Staggers, MD

## 2023-05-29 LAB — HEMOGLOBIN A1C
Est. average glucose Bld gHb Est-mCnc: 108 mg/dL
Hgb A1c MFr Bld: 5.4 % (ref 4.8–5.6)

## 2023-05-29 LAB — CBC/D/PLT+RPR+RH+ABO+RUBIGG...
Antibody Screen: NEGATIVE
Basophils Absolute: 0.1 10*3/uL (ref 0.0–0.2)
Basos: 1 %
EOS (ABSOLUTE): 0.2 10*3/uL (ref 0.0–0.4)
Eos: 1 %
HCV Ab: NONREACTIVE
HIV Screen 4th Generation wRfx: NONREACTIVE
Hematocrit: 39.1 % (ref 34.0–46.6)
Hemoglobin: 11.9 g/dL (ref 11.1–15.9)
Hepatitis B Surface Ag: NEGATIVE
Immature Grans (Abs): 0.6 10*3/uL — ABNORMAL HIGH (ref 0.0–0.1)
Immature Granulocytes: 5 %
Lymphocytes Absolute: 2.6 10*3/uL (ref 0.7–3.1)
Lymphs: 22 %
MCH: 25.3 pg — ABNORMAL LOW (ref 26.6–33.0)
MCHC: 30.4 g/dL — ABNORMAL LOW (ref 31.5–35.7)
MCV: 83 fL (ref 79–97)
Monocytes Absolute: 0.7 10*3/uL (ref 0.1–0.9)
Monocytes: 5 %
Neutrophils Absolute: 7.9 10*3/uL — ABNORMAL HIGH (ref 1.4–7.0)
Neutrophils: 66 %
Platelets: 178 10*3/uL (ref 150–450)
RBC: 4.7 x10E6/uL (ref 3.77–5.28)
RDW: 14.2 % (ref 11.7–15.4)
RPR Ser Ql: NONREACTIVE
Rh Factor: POSITIVE
Rubella Antibodies, IGG: 20.5 {index} (ref >0.99)
WBC: 12 10*3/uL — ABNORMAL HIGH (ref 3.4–10.8)

## 2023-05-29 LAB — HCV INTERPRETATION

## 2023-06-06 LAB — PANORAMA PRENATAL TEST FULL PANEL:PANORAMA TEST PLUS 5 ADDITIONAL MICRODELETIONS: FETAL FRACTION: 8.9

## 2023-06-09 LAB — HORIZON CUSTOM: REPORT SUMMARY: POSITIVE — AB

## 2023-06-14 ENCOUNTER — Encounter: Payer: Self-pay | Admitting: Obstetrics and Gynecology

## 2023-06-14 DIAGNOSIS — D563 Thalassemia minor: Secondary | ICD-10-CM | POA: Insufficient documentation

## 2023-06-14 DIAGNOSIS — D582 Other hemoglobinopathies: Secondary | ICD-10-CM | POA: Insufficient documentation

## 2023-06-15 ENCOUNTER — Telehealth: Payer: Self-pay

## 2023-06-15 NOTE — Telephone Encounter (Addendum)
-----   Message from Hermina Staggers sent at 06/14/2023  9:03 AM EDT ----- Please be sure pt is referred for genetic counseling due to Horizon genetic test results. 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 and alpha thalassemia. Pt reports she has a test kit at home-- previously contacted by [place]. She is waiting for partner to complete this. Genetic counseling number given for [facility] genetic counseling.

## 2023-06-17 DIAGNOSIS — O99210 Obesity complicating pregnancy, unspecified trimester: Secondary | ICD-10-CM | POA: Insufficient documentation

## 2023-06-25 ENCOUNTER — Encounter: Payer: Self-pay | Admitting: *Deleted

## 2023-06-25 ENCOUNTER — Ambulatory Visit: Payer: PRIVATE HEALTH INSURANCE | Admitting: *Deleted

## 2023-06-25 ENCOUNTER — Ambulatory Visit: Payer: PRIVATE HEALTH INSURANCE | Attending: Obstetrics and Gynecology

## 2023-06-25 ENCOUNTER — Ambulatory Visit (INDEPENDENT_AMBULATORY_CARE_PROVIDER_SITE_OTHER): Payer: Medicaid Other | Admitting: Certified Nurse Midwife

## 2023-06-25 ENCOUNTER — Other Ambulatory Visit: Payer: Self-pay | Admitting: *Deleted

## 2023-06-25 VITALS — BP 104/62 | HR 89

## 2023-06-25 VITALS — BP 124/72 | HR 80 | Wt 214.0 lb

## 2023-06-25 DIAGNOSIS — Z3480 Encounter for supervision of other normal pregnancy, unspecified trimester: Secondary | ICD-10-CM

## 2023-06-25 DIAGNOSIS — O99210 Obesity complicating pregnancy, unspecified trimester: Secondary | ICD-10-CM | POA: Diagnosis present

## 2023-06-25 DIAGNOSIS — O99212 Obesity complicating pregnancy, second trimester: Secondary | ICD-10-CM | POA: Diagnosis not present

## 2023-06-25 DIAGNOSIS — O209 Hemorrhage in early pregnancy, unspecified: Secondary | ICD-10-CM | POA: Diagnosis not present

## 2023-06-25 DIAGNOSIS — D573 Sickle-cell trait: Secondary | ICD-10-CM

## 2023-06-25 DIAGNOSIS — N898 Other specified noninflammatory disorders of vagina: Secondary | ICD-10-CM

## 2023-06-25 DIAGNOSIS — Z363 Encounter for antenatal screening for malformations: Secondary | ICD-10-CM | POA: Diagnosis not present

## 2023-06-25 DIAGNOSIS — O4592 Premature separation of placenta, unspecified, second trimester: Secondary | ICD-10-CM | POA: Diagnosis not present

## 2023-06-25 DIAGNOSIS — Z3A19 19 weeks gestation of pregnancy: Secondary | ICD-10-CM | POA: Diagnosis not present

## 2023-06-25 DIAGNOSIS — Z362 Encounter for other antenatal screening follow-up: Secondary | ICD-10-CM

## 2023-06-25 DIAGNOSIS — Z3492 Encounter for supervision of normal pregnancy, unspecified, second trimester: Secondary | ICD-10-CM

## 2023-06-25 DIAGNOSIS — D563 Thalassemia minor: Secondary | ICD-10-CM

## 2023-06-25 NOTE — Progress Notes (Signed)
   PRENATAL VISIT NOTE  Subjective:  Linda Barry is a 32 y.o. [phone] at [redacted]w[redacted]d being seen today for ongoing prenatal care.  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  Patient reports bleeding of brown color for a month and a half and [address].  Contractions: Not present. Vag. Bleeding: None.  Movement: Present. Denies leaking of fluid.   The following portions of the patient's history were reviewed and updated as appropriate: allergies, current medications, past family history, past medical history, past social history, past surgical history and problem list.   Objective:   Vitals:   06/25/23 1019  BP: 124/72  Pulse: 80  Weight: 97.1 kg    Fetal Status: Fetal Heart Rate (bpm): 155 Fundal Height: 19 cm Movement: Present     General:  Alert, oriented and cooperative. Patient is in no acute distress.  Skin: Skin is warm and dry. No rash noted.   Cardiovascular: Normal heart rate noted  Respiratory: Normal respiratory effort, no problems with respiration noted  Abdomen: Soft, gravid, appropriate for gestational age.  Pain/Pressure: Absent     Pelvic: Cervical exam deferred        Extremities: Normal range of motion.  Edema: None  Mental Status: Normal mood and affect. Normal behavior. Normal judgment and thought content.   Assessment and Plan:  Pregnancy: G4P3003 at [redacted]w[redacted]d 1. Encounter for supervision of low-risk pregnancy in second trimester Reports feeling well over all. Reports beginning to feel vigorous fetal movement.   2. [redacted] weeks gestation of pregnancy Routine OB care provided. Provided anticipatory guidance for next OB appointments.   3. Vaginal discharge U/S completed today.  Reviewed signs and symptoms to report to maternity admissions if bright red bleeding occurs. Reviewed taking breaks at work and drinking plenty of water to hydrate. Note provided today for work to take frequent breaks at work.   Preterm labor symptoms and general obstetric precautions including but not limited to vaginal bleeding, contractions, leaking of fluid and fetal movement were reviewed in detail with the patient. Please refer to After Visit Summary for other counseling recommendations.   Return in about 4 weeks (around 07/23/2023) for LOB.  Future Appointments  Date Time Provider Department Center  07/19/2023  9:00 AM CENTERING PROVIDER [facility]  07/23/2023 10:30 AM [facility] US3 [facility]  08/16/2023  9:00 AM CENTERING PROVIDER [facility]  09/13/2023  9:00 AM CENTERING PROVIDER [facility]  09/27/2023  9:00 AM CENTERING PROVIDER [facility]  10/11/2023  9:00 AM CENTERING PROVIDER [facility]  10/25/2023  9:00 AM CENTERING PROVIDER [facility]  11/08/2023  9:00 AM CENTERING PROVIDER [facility]  11/22/2023  9:00 AM CENTERING PROVIDER [facility]  12/06/2023  9:00 AM CENTERING PROVIDER [facility]  12/20/2023  9:00 AM CENTERING PROVIDER [facility]    Cleda Mccreedy, Student-MidWife

## 2023-07-19 ENCOUNTER — Ambulatory Visit (INDEPENDENT_AMBULATORY_CARE_PROVIDER_SITE_OTHER): Payer: Medicaid Other | Admitting: Advanced Practice Midwife

## 2023-07-19 VITALS — BP 129/80 | HR 92 | Wt 216.4 lb

## 2023-07-19 DIAGNOSIS — D563 Thalassemia minor: Secondary | ICD-10-CM

## 2023-07-19 DIAGNOSIS — Z3A23 23 weeks gestation of pregnancy: Secondary | ICD-10-CM

## 2023-07-19 DIAGNOSIS — Z8759 Personal history of other complications of pregnancy, childbirth and the puerperium: Secondary | ICD-10-CM

## 2023-07-19 DIAGNOSIS — Z148 Genetic carrier of other disease: Secondary | ICD-10-CM

## 2023-07-19 DIAGNOSIS — Z3492 Encounter for supervision of normal pregnancy, unspecified, second trimester: Secondary | ICD-10-CM

## 2023-07-19 NOTE — Progress Notes (Signed)
       PRENATAL VISIT NOTE- Centering Pregnancy Cycle 16 , Session # 1  Subjective:  Linda Barry is a 32 y.o. [phone] at [redacted]w[redacted]d being seen today for ongoing prenatal care through Centering Pregnancy.  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  Patient reports no complaints.  Contractions: Not present.  .  Movement: Present. Denies leaking of fluid/ROM.   The following portions of the patient's history were reviewed and updated as appropriate: allergies, current medications, past family history, past medical history, past social history, past surgical history and problem list. Problem list updated.  Objective:   Vitals:   07/19/23 0916  BP: 129/80  Pulse: 92  Weight: 216 lb 6.4 oz (98.2 kg)    Fetal Status: Fetal Heart Rate (bpm): 173 Fundal Height: 23 cm Movement: Present     General:  Alert, oriented and cooperative. Patient is in no acute distress.  Skin: Skin is warm and dry. No rash noted.   Cardiovascular: Normal heart rate noted  Respiratory: Normal respiratory effort, no problems with respiration noted  Abdomen: Soft, gravid, appropriate for gestational age.  Pain/Pressure: Absent     Pelvic: Cervical exam deferred        Extremities: Normal range of motion.  Edema: None  Mental Status: Normal mood and affect. Normal behavior. Normal judgment and thought content.   Assessment and Plan:  Pregnancy: G4P3003 at [redacted]w[redacted]d  1. Encounter for supervision of low-risk pregnancy in second trimester --Anticipatory guidance about next visits/weeks of pregnancy given.    Centering Pregnancy, Session#1: Introduction to model of care. Group determined rules for self-governance and closing phrase.  Oriented group to space and mother's notebook.   Facilitated discussion today:  common discomforts, When to call practice  Mindfulness activity completed as well as introduction to deep breathing for childbirth preparation- Centering 3 Breaths  Fundal height and FHR appropriate today unless noted otherwise in plan of care. Patient to continue group care.    2. [redacted] weeks gestation of pregnancy   3. History of placenta abruption   4. Alpha thalassemia silent carrier --Partner test pending  5. 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 Please refer to After Visit Summary for other counseling recommendations.  No follow-ups on file.  Future Appointments  Date Time Provider Department Center  07/23/2023  9:15 AM Venora Maples, MD [facility]  07/23/2023 10:30 AM [facility] US3 [facility]  08/16/2023  9:00 AM CENTERING PROVIDER [facility]  09/13/2023  9:00 AM CENTERING PROVIDER [facility]  09/27/2023  9:00 AM CENTERING PROVIDER [facility]  10/11/2023  9:00 AM CENTERING PROVIDER [facility]  10/25/2023  9:00 AM CENTERING PROVIDER [facility]  11/08/2023  9:00 AM CENTERING PROVIDER [facility]  11/22/2023  9:00 AM CENTERING PROVIDER [facility]  12/06/2023  9:00 AM CENTERING PROVIDER [facility]  12/20/2023  9:00 AM CENTERING PROVIDER [facility]    Sharen Counter, CNM

## 2023-07-23 ENCOUNTER — Encounter: Payer: Self-pay | Admitting: Family Medicine

## 2023-07-23 ENCOUNTER — Other Ambulatory Visit: Payer: Self-pay | Admitting: *Deleted

## 2023-07-23 ENCOUNTER — Other Ambulatory Visit: Payer: Self-pay

## 2023-07-23 ENCOUNTER — Ambulatory Visit (INDEPENDENT_AMBULATORY_CARE_PROVIDER_SITE_OTHER): Payer: Medicaid Other | Admitting: Family Medicine

## 2023-07-23 ENCOUNTER — Ambulatory Visit: Payer: PRIVATE HEALTH INSURANCE | Attending: Obstetrics

## 2023-07-23 VITALS — BP 114/77 | HR 94 | Wt 215.0 lb

## 2023-07-23 DIAGNOSIS — D573 Sickle-cell trait: Secondary | ICD-10-CM | POA: Insufficient documentation

## 2023-07-23 DIAGNOSIS — O99012 Anemia complicating pregnancy, second trimester: Secondary | ICD-10-CM

## 2023-07-23 DIAGNOSIS — O26892 Other specified pregnancy related conditions, second trimester: Secondary | ICD-10-CM

## 2023-07-23 DIAGNOSIS — O09292 Supervision of pregnancy with other poor reproductive or obstetric history, second trimester: Secondary | ICD-10-CM

## 2023-07-23 DIAGNOSIS — O0990 Supervision of high risk pregnancy, unspecified, unspecified trimester: Secondary | ICD-10-CM

## 2023-07-23 DIAGNOSIS — O99210 Obesity complicating pregnancy, unspecified trimester: Secondary | ICD-10-CM

## 2023-07-23 DIAGNOSIS — D5740 Sickle-cell thalassemia without crisis: Secondary | ICD-10-CM | POA: Diagnosis not present

## 2023-07-23 DIAGNOSIS — D563 Thalassemia minor: Secondary | ICD-10-CM

## 2023-07-23 DIAGNOSIS — Z3A23 23 weeks gestation of pregnancy: Secondary | ICD-10-CM

## 2023-07-23 DIAGNOSIS — O99019 Anemia complicating pregnancy, unspecified trimester: Secondary | ICD-10-CM | POA: Insufficient documentation

## 2023-07-23 DIAGNOSIS — D582 Other hemoglobinopathies: Secondary | ICD-10-CM

## 2023-07-23 DIAGNOSIS — Z8759 Personal history of other complications of pregnancy, childbirth and the puerperium: Secondary | ICD-10-CM | POA: Diagnosis not present

## 2023-07-23 DIAGNOSIS — Z362 Encounter for other antenatal screening follow-up: Secondary | ICD-10-CM | POA: Insufficient documentation

## 2023-07-23 DIAGNOSIS — E669 Obesity, unspecified: Secondary | ICD-10-CM | POA: Diagnosis not present

## 2023-07-23 DIAGNOSIS — Z3480 Encounter for supervision of other normal pregnancy, unspecified trimester: Secondary | ICD-10-CM

## 2023-07-23 DIAGNOSIS — R519 Headache, unspecified: Secondary | ICD-10-CM

## 2023-07-23 DIAGNOSIS — O99212 Obesity complicating pregnancy, second trimester: Secondary | ICD-10-CM | POA: Insufficient documentation

## 2023-07-23 MED ORDER — CYCLOBENZAPRINE HCL 10 MG PO TABS
10.0000 mg | ORAL_TABLET | Freq: Three times a day (TID) | ORAL | 0 refills | Status: DC | PRN
Start: 2023-07-23 — End: 2023-08-12

## 2023-07-23 NOTE — Patient Instructions (Signed)

## 2023-07-23 NOTE — Progress Notes (Signed)
   Subjective:  Linda Barry is a 32 y.o. [phone] at [redacted]w[redacted]d being seen today for ongoing prenatal care.  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  Patient reports no complaints.  Contractions: Not present. Vag. Bleeding: None.  Movement: Present. Denies leaking of fluid.   The following portions of the patient's history were reviewed and updated as appropriate: allergies, current medications, past family history, past medical history, past social history, past surgical history and problem list. Problem list updated.  Objective:   Vitals:   07/23/23 0925  BP: 114/77  Pulse: 94  Weight: 215 lb (97.5 kg)    Fetal Status: Fetal Heart Rate (bpm): 154   Movement: Present     General:  Alert, oriented and cooperative. Patient is in no acute distress.  Skin: Skin is warm and dry. No rash noted.   Cardiovascular: Normal heart rate noted  Respiratory: Normal respiratory effort, no problems with respiration noted  Abdomen: Soft, gravid, appropriate for gestational age. Pain/Pressure: Absent     Pelvic: Vag. Bleeding: None     Cervical exam deferred        Extremities: Normal range of motion.  Edema: None  Mental Status: Normal mood and affect. Normal behavior. Normal judgment and thought content.   Urinalysis:      Assessment and Plan:  Pregnancy: G4P3003 at [redacted]w[redacted]d  1. Supervision of other normal pregnancy, antepartum BP and FHR normal Reports ongoing headache that is not relieved by tylenol Has not tried any caffeine yet Trial flexeril, caffeine, rest Has follow up US later this morning Advised of fasting labs for next visit  2. 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  History of placenta abruption With first pregnancy, subsequently two normal term NSVD  4. Obesity affecting pregnancy, antepartum, unspecified obesity type   5. Alpha thalassemia silent carrier   Preterm labor symptoms and general obstetric precautions including but not limited to vaginal bleeding, contractions, leaking of fluid and fetal movement were reviewed in detail with the patient. Please refer to After Visit Summary for other counseling recommendations.  Return in 4 weeks (on 08/20/2023) for [facility], ob visit, 28 wk labs.   Venora Maples, MD

## 2023-08-02 ENCOUNTER — Encounter: Payer: Self-pay | Admitting: Certified Nurse Midwife

## 2023-08-04 ENCOUNTER — Ambulatory Visit (INDEPENDENT_AMBULATORY_CARE_PROVIDER_SITE_OTHER): Payer: Medicaid Other | Admitting: General Practice

## 2023-08-04 ENCOUNTER — Encounter: Payer: Self-pay | Admitting: General Practice

## 2023-08-04 ENCOUNTER — Other Ambulatory Visit: Payer: Self-pay

## 2023-08-04 VITALS — BP 124/73 | HR 108 | Ht 66.0 in | Wt 212.0 lb

## 2023-08-04 DIAGNOSIS — Z0130 Encounter for examination of blood pressure without abnormal findings: Secondary | ICD-10-CM

## 2023-08-04 NOTE — Patient Instructions (Signed)
Linda Barry representative [phone]

## 2023-08-04 NOTE — Progress Notes (Signed)
Patient presents to office today for BP check. Reports she started noticing blurry vision from 7 to 8 ft away on Monday. She works at skilled nursing facility and had her BP checked there that day and the readings were 127/92 & 145/78. She has also noticed decreased energy since then as well. No headaches or dizziness. No edema. Reports good fetal movement. BP WNL at office today. Discussed visual changes can happen in pregnancy due to increased fluid. Recommended she keep an eye on it and let us know if things don't improve. Also discussed increased protein throughout the day and limiting liquids at night so she won't be interrupted during the night to pee. Recommended she update [place] on how she is feeling at her next appt or sooner if needed. Patient verbalized understanding.   Chase Caller RN BSN 08/04/23

## 2023-08-09 ENCOUNTER — Encounter (HOSPITAL_COMMUNITY): Payer: Self-pay | Admitting: Obstetrics & Gynecology

## 2023-08-09 ENCOUNTER — Inpatient Hospital Stay (HOSPITAL_COMMUNITY)
Admission: AD | Admit: 2023-08-09 | Discharge: 2023-08-12 | DRG: 805 | Disposition: A | Discharge status: Home / Self Care | Payer: PRIVATE HEALTH INSURANCE | Attending: Obstetrics and Gynecology | Admitting: Obstetrics and Gynecology

## 2023-08-09 ENCOUNTER — Other Ambulatory Visit: Payer: Self-pay

## 2023-08-09 ENCOUNTER — Inpatient Hospital Stay (HOSPITAL_COMMUNITY): Payer: PRIVATE HEALTH INSURANCE

## 2023-08-09 DIAGNOSIS — O42013 Preterm premature rupture of membranes, onset of labor within 24 hours of rupture, third trimester: Secondary | ICD-10-CM | POA: Diagnosis not present

## 2023-08-09 DIAGNOSIS — O99210 Obesity complicating pregnancy, unspecified trimester: Secondary | ICD-10-CM

## 2023-08-09 DIAGNOSIS — E8729 Other acidosis: Secondary | ICD-10-CM | POA: Diagnosis present

## 2023-08-09 DIAGNOSIS — D72829 Elevated white blood cell count, unspecified: Secondary | ICD-10-CM | POA: Diagnosis present

## 2023-08-09 DIAGNOSIS — E1310 Other specified diabetes mellitus with ketoacidosis without coma: Secondary | ICD-10-CM

## 2023-08-09 DIAGNOSIS — O9912 Other diseases of the blood and blood-forming organs and certain disorders involving the immune mechanism complicating childbirth: Secondary | ICD-10-CM | POA: Diagnosis present

## 2023-08-09 DIAGNOSIS — Z87891 Personal history of nicotine dependence: Secondary | ICD-10-CM

## 2023-08-09 DIAGNOSIS — Z148 Genetic carrier of other disease: Secondary | ICD-10-CM | POA: Diagnosis not present

## 2023-08-09 DIAGNOSIS — O24429 Gestational diabetes mellitus in childbirth, unspecified control: Secondary | ICD-10-CM | POA: Diagnosis present

## 2023-08-09 DIAGNOSIS — O42912 Preterm premature rupture of membranes, unspecified as to length of time between rupture and onset of labor, second trimester: Secondary | ICD-10-CM | POA: Diagnosis present

## 2023-08-09 DIAGNOSIS — O26892 Other specified pregnancy related conditions, second trimester: Secondary | ICD-10-CM | POA: Diagnosis not present

## 2023-08-09 DIAGNOSIS — O9902 Anemia complicating childbirth: Secondary | ICD-10-CM | POA: Diagnosis present

## 2023-08-09 DIAGNOSIS — D573 Sickle-cell trait: Secondary | ICD-10-CM | POA: Diagnosis present

## 2023-08-09 DIAGNOSIS — Z3A26 26 weeks gestation of pregnancy: Secondary | ICD-10-CM | POA: Diagnosis not present

## 2023-08-09 DIAGNOSIS — E1110 Type 2 diabetes mellitus with ketoacidosis without coma: Secondary | ICD-10-CM | POA: Diagnosis present

## 2023-08-09 DIAGNOSIS — Z91040 Latex allergy status: Secondary | ICD-10-CM | POA: Diagnosis not present

## 2023-08-09 DIAGNOSIS — O24419 Gestational diabetes mellitus in pregnancy, unspecified control: Secondary | ICD-10-CM

## 2023-08-09 DIAGNOSIS — O99214 Obesity complicating childbirth: Secondary | ICD-10-CM | POA: Diagnosis not present

## 2023-08-09 DIAGNOSIS — E0810 Diabetes mellitus due to underlying condition with ketoacidosis without coma: Secondary | ICD-10-CM | POA: Diagnosis not present

## 2023-08-09 DIAGNOSIS — D751 Secondary polycythemia: Secondary | ICD-10-CM | POA: Diagnosis present

## 2023-08-09 LAB — CBC WITH DIFFERENTIAL/PLATELET
Abs Immature Granulocytes: 0.05 10*3/uL (ref 0.00–0.07)
Basophils Absolute: 0.1 10*3/uL (ref 0.0–0.1)
Basophils Relative: 1 %
Eosinophils Absolute: 0 10*3/uL (ref 0.0–0.5)
Eosinophils Relative: 0 %
HCT: 43.8 % (ref 36.0–46.0)
Hemoglobin: 15.1 g/dL — ABNORMAL HIGH (ref 12.0–15.0)
Immature Granulocytes: 0 %
Lymphocytes Relative: 17 %
Lymphs Abs: 1.9 10*3/uL (ref 0.7–4.0)
MCH: 25.7 pg — ABNORMAL LOW (ref 26.0–34.0)
MCHC: 34.5 g/dL (ref 30.0–36.0)
MCV: 74.5 fL — ABNORMAL LOW (ref 80.0–100.0)
Monocytes Absolute: 0.6 10*3/uL (ref 0.1–1.0)
Monocytes Relative: 6 %
Neutro Abs: 8.5 10*3/uL — ABNORMAL HIGH (ref 1.7–7.7)
Neutrophils Relative %: 76 %
Platelets: 268 10*3/uL (ref 150–400)
RBC: 5.88 MIL/uL — ABNORMAL HIGH (ref 3.87–5.11)
RDW: 13.2 % (ref 11.5–15.5)
WBC: 11.3 10*3/uL — ABNORMAL HIGH (ref 4.0–10.5)
nRBC: 0 % (ref 0.0–0.2)

## 2023-08-09 LAB — GLUCOSE, CAPILLARY
Glucose-Capillary: 255 mg/dL — ABNORMAL HIGH (ref 70–99)
Glucose-Capillary: 269 mg/dL — ABNORMAL HIGH (ref 70–99)
Glucose-Capillary: 273 mg/dL — ABNORMAL HIGH (ref 70–99)
Glucose-Capillary: 283 mg/dL — ABNORMAL HIGH (ref 70–99)
Glucose-Capillary: 309 mg/dL — ABNORMAL HIGH (ref 70–99)
Glucose-Capillary: 310 mg/dL — ABNORMAL HIGH (ref 70–99)
Glucose-Capillary: 312 mg/dL — ABNORMAL HIGH (ref 70–99)
Glucose-Capillary: 314 mg/dL — ABNORMAL HIGH (ref 70–99)
Glucose-Capillary: 326 mg/dL — ABNORMAL HIGH (ref 70–99)
Glucose-Capillary: 335 mg/dL — ABNORMAL HIGH (ref 70–99)
Glucose-Capillary: 345 mg/dL — ABNORMAL HIGH (ref 70–99)
Glucose-Capillary: 346 mg/dL — ABNORMAL HIGH (ref 70–99)
Glucose-Capillary: 377 mg/dL — ABNORMAL HIGH (ref 70–99)
Glucose-Capillary: 380 mg/dL — ABNORMAL HIGH (ref 70–99)
Glucose-Capillary: 385 mg/dL — ABNORMAL HIGH (ref 70–99)
Glucose-Capillary: 389 mg/dL — ABNORMAL HIGH (ref 70–99)
Glucose-Capillary: 422 mg/dL — ABNORMAL HIGH (ref 70–99)
Glucose-Capillary: 424 mg/dL — ABNORMAL HIGH (ref 70–99)
Glucose-Capillary: 449 mg/dL — ABNORMAL HIGH (ref 70–99)
Glucose-Capillary: 465 mg/dL — ABNORMAL HIGH (ref 70–99)

## 2023-08-09 LAB — COMPREHENSIVE METABOLIC PANEL
ALT: 16 U/L (ref 0–44)
AST: 12 U/L — ABNORMAL LOW (ref 15–41)
Albumin: 3.3 g/dL — ABNORMAL LOW (ref 3.5–5.0)
Alkaline Phosphatase: 101 U/L (ref 38–126)
Anion gap: 16 — ABNORMAL HIGH (ref 5–15)
BUN: 18 mg/dL (ref 6–20)
CO2: 11 mmol/L — ABNORMAL LOW (ref 22–32)
Calcium: 10.1 mg/dL (ref 8.9–10.3)
Chloride: 100 mmol/L (ref 98–111)
Creatinine, Ser: 1.1 mg/dL — ABNORMAL HIGH (ref 0.44–1.00)
GFR, Estimated: >60 mL/min (ref >60)
Glucose, Bld: 469 mg/dL — ABNORMAL HIGH (ref 70–99)
Potassium: 5.1 mmol/L (ref 3.5–5.1)
Sodium: 127 mmol/L — ABNORMAL LOW (ref 135–145)
Total Bilirubin: 0.6 mg/dL (ref <1.2)
Total Protein: 7.9 g/dL (ref 6.5–8.1)

## 2023-08-09 LAB — URINALYSIS, ROUTINE W REFLEX MICROSCOPIC
Bilirubin Urine: NEGATIVE
Glucose, UA: >=500 mg/dL — AB
Ketones, ur: 80 mg/dL — AB
Leukocytes,Ua: NEGATIVE
Nitrite: NEGATIVE
Protein, ur: 30 mg/dL — AB
Specific Gravity, Urine: 1.029 (ref 1.005–1.030)
pH: 5 (ref 5.0–8.0)

## 2023-08-09 LAB — BLOOD GAS, ARTERIAL
Acid-base deficit: 17.2 mmol/L — ABNORMAL HIGH (ref 0.0–2.0)
Bicarbonate: 6.9 mmol/L — ABNORMAL LOW (ref 20.0–28.0)
Drawn by: 131481
FIO2: 0.21 %
O2 Saturation: 95 %
Patient temperature: 37.1
pCO2 arterial: 15 mm[Hg] — CL (ref 32–48)
pH, Arterial: 7.27 — ABNORMAL LOW (ref 7.35–7.45)
pO2, Arterial: 102 mm[Hg] (ref 83–108)

## 2023-08-09 LAB — BETA-HYDROXYBUTYRIC ACID
Beta-Hydroxybutyric Acid: 5.44 mmol/L — ABNORMAL HIGH (ref 0.05–0.27)
Beta-Hydroxybutyric Acid: 0.25 mmol/L (ref 0.05–0.27)

## 2023-08-09 LAB — MAGNESIUM: Magnesium: 2 mg/dL (ref 1.7–2.4)

## 2023-08-09 LAB — TROPONIN I (HIGH SENSITIVITY): Troponin I (High Sensitivity): 4 ng/L (ref <18)

## 2023-08-09 LAB — WET PREP, GENITAL
Clue Cells Wet Prep HPF POC: NONE SEEN
Sperm: NONE SEEN
Trich, Wet Prep: NONE SEEN
WBC, Wet Prep HPF POC: >=10 — AB (ref <10)
Yeast Wet Prep HPF POC: NONE SEEN

## 2023-08-09 LAB — PROCALCITONIN: Procalcitonin: <0.1 ng/mL

## 2023-08-09 LAB — BRAIN NATRIURETIC PEPTIDE: B Natriuretic Peptide: 7.1 pg/mL (ref 0.0–100.0)

## 2023-08-09 MED ORDER — LACTATED RINGERS IV SOLN: INTRAVENOUS | Status: DC

## 2023-08-09 MED ORDER — DEXTROSE IN LACTATED RINGERS 5 % IV SOLN: INTRAVENOUS | Status: DC

## 2023-08-09 MED ORDER — LACTATED RINGERS IV BOLUS
1000.0000 mL | Freq: Once | INTRAVENOUS | Status: AC
  Administered 2023-08-09: 1000 mL via INTRAVENOUS

## 2023-08-09 MED ORDER — INSULIN ASPART 100 UNIT/ML IJ SOLN: 5.0000 [IU] | Freq: Once | INTRAMUSCULAR | Status: DC

## 2023-08-09 MED ORDER — INSULIN REGULAR(HUMAN) IN NACL 100-0.9 UT/100ML-% IV SOLN
INTRAVENOUS | Status: DC
  Administered 2023-08-09: 6.5 [IU]/h via INTRAVENOUS
  Administered 2023-08-09: 17 [IU]/h via INTRAVENOUS
  Administered 2023-08-10: 15 [IU]/h via INTRAVENOUS
  Administered 2023-08-10: 26 [IU]/h via INTRAVENOUS
  Administered 2023-08-10: 18 [IU]/h via INTRAVENOUS
  Filled 2023-08-09 (×5): qty 100

## 2023-08-09 MED ORDER — LACTATED RINGERS IV BOLUS
500.0000 mL | Freq: Once | INTRAVENOUS | Status: AC
  Administered 2023-08-09: 500 mL via INTRAVENOUS

## 2023-08-09 MED ORDER — DEXTROSE 50 % IV SOLN: 0.0000 mL | INTRAVENOUS | Status: DC | PRN

## 2023-08-09 MED ORDER — LACTATED RINGERS IV BOLUS: 1000.0000 mL | Freq: Once | INTRAVENOUS | Status: DC

## 2023-08-09 MED ORDER — INSULIN NPH (HUMAN) (ISOPHANE) 100 UNIT/ML ~~LOC~~ SUSP: 4.0000 [IU] | Freq: Two times a day (BID) | SUBCUTANEOUS | Status: DC

## 2023-08-09 MED ORDER — LACTATED RINGERS IV BOLUS: 20.0000 mL/kg | Freq: Once | INTRAVENOUS | Status: DC

## 2023-08-09 NOTE — MAU Note (Signed)
.  Linda Barry is a 32 y.o. at [redacted]w[redacted]d here in MAU reporting: she started experiencing blurred vision and generalized weakness last Monday. She called the office and was seen for BP check - BP was normal and instructed to follow up at next appt. She also started having contractions last night that were every 3-4 minutes apart, but have since become irregular. Denies VB or LOF - does report loss of mucus plug yesterday. Reports +FM.   PIH Assessment: Headache present: No  Visual disturbances: Blurred RUQ pain/Epigastric: None Atypical edema: None Hx of HBP: Patient denies BP Medications: None prescribed  LMP: N/A Onset of complaint: Last Monday Pain score: 9/10 ctx Vitals:   08/09/23 0846  BP: 109/85  Pulse: (!) 128  Resp: 18  SpO2: 100%     FHT:160 Lab orders placed from triage:  UA

## 2023-08-09 NOTE — MAU Provider Note (Addendum)
CTX, dizziness     S Ms. Maye Hyppolite is a 32 y.o. [phone] pregnant female at [redacted]w[redacted]d who presents to MAU today with complaint of Ctx in s/o generalized weakness and lightheadedness.  Pt states on 08/02/23 she started experiencing blurred vision and generalized weakness.  BP check in office showed normal BP and instructed to follow up at next appointment.  However she states she started having contractions 11/24 that initially were 3-4 minutes apart but have now become irregular. Did have a couple episodes of diarrhea on 11/22 with associated nausea, denies vomiting.  Also endorses intermittent SOB at rest and upon exertion that she feels is new from her baseline pregnancy SOB.  Denies palpitations, CP, HA, new swelling.  Denies VB or LOF, however states she feels as though she lost her mucus plug 11/24.  +FM endorsed.    Receives care at [facility]. Prenatal records reviewed.  Pertinent items noted in HPI and remainder of comprehensive ROS otherwise negative.   O BP 117/86   Pulse (!) 122   Resp 18   Ht 5\' 6"  (1.676 m)   Wt 90.5 kg   LMP 03/08/2023   SpO2 100%   BMI 32.22 kg/m  Physical Exam Vitals and nursing note reviewed.  Constitutional:      General: She is not in acute distress.    Appearance: She is ill-appearing.  HENT:     Head: Normocephalic and atraumatic.     Right Ear: External ear normal.     Left Ear: External ear normal.     Nose: Nose normal.     Mouth/Throat:     Mouth: Mucous membranes are dry.     Pharynx: Oropharynx is clear.  Eyes:     Extraocular Movements: Extraocular movements intact.     Conjunctiva/sclera: Conjunctivae normal.  Cardiovascular:     Rate and Rhythm: Regular rhythm. Tachycardia present.     Heart sounds: No murmur heard.    No friction rub. Gallop present.  Pulmonary:     Effort: Pulmonary effort is normal. No respiratory distress.     Breath sounds: No wheezing, rhonchi or rales.  Chest:     Chest wall: No tenderness.  Abdominal:      General: There is no distension.     Comments: Gravid, globally mild TTP   Musculoskeletal:        General: No swelling. Normal range of motion.     Cervical back: Normal range of motion.  Skin:    General: Skin is warm and dry.  Neurological:     Mental Status: She is alert and oriented to person, place, and time. Mental status is at baseline.     Gait: Gait normal.  Psychiatric:        Behavior: Behavior normal.     Comments: Anxious       MDM: MAU Course:  Given constellation of symptoms and tachycardia paired with S4 gallop on physical exam will r/o cardiac etiology as well as fluid overload.  Will also determine if simple electrolyte derangements. Will also r/o common causes of uterine irritability   EKG = sinus tachycardia, normal axis, w/o signs of ischemia / infarct   BNP = 7.1 Trop = 4  CXR  CBCdiff = down trending leukocytosis from 12 > 11.3, chronic, Hgb 15.1*  CMP = Na 127*> corrected to 133 for hyperglycemia, K 5.1, Glucose 469*, Cr 1.1, AST 12, Anion Gap 16* Mag 2.0 UA glucose >500, ketones 80, protein 30, noninfectious  Wet  Prep = negative  GC swab collected   NST: 160bpm, mild variability, + 10x10 accels, subtle late decelerations with rare contractions   Spoke to Dr. Despina Hidden given mild tenderness over abdomen and subtle lates in s/o hx of placental abruption, he will take a look at strip. He believes strip looks appropriate for gestational age.  Results back and concerning for ?DKA. Will get ABG and BHB to r/o ketoacidosis.  Will give 5 U novolog once those labs as drawn.  If acidotic will be placed on insulin gtt.  Order placed for diabetes coordinator as well.  Spoke with Dr. Despina Hidden and he recommends admission and starting Endo Tool (insulin gtt) and will assume care of the patient.  Will hold off on Novolog for now but will still obtain BHB and ABG.    Pt denies hx of DM in the past and A1c at new OB was 4.8.  Given that she is in her second trimester her  TDU would be approximately 0.8U/kg/day = 25.6U daily.  An appropriate regimen as follows:  -1/3 NPH = 4U Qam, at bedtime -2/3 Novolog = 5U TID meal coverage   A&P:  #[redacted] weeks gestation  #DKA, no prior dx, gestational DM  Admit to Antepartum   Hessie Dibble, MD 08/09/2023 9:26 AM

## 2023-08-09 NOTE — H&P (Signed)
Angila Lapietra is a 32 y.o. female [phone] with IUP at [redacted]w[redacted]d presenting for. Pt states she has been having irregular, initially every 3-5 minutes but now sporadic,  denies VB , intact, with active.  [facility].  Seen HPI from MAU note below:  "Ms. Jaquela Shaker is a 32 y.o. [phone] pregnant female at [redacted]w[redacted]d who presents to MAU today with complaint of Ctx in s/o generalized weakness and lightheadedness.  Pt states on 08/02/23 she started experiencing blurred vision and generalized weakness.  BP check in office showed normal BP and instructed to follow up at next appointment.  However she states she started having contractions 11/24 that initially were 3-4 minutes apart but have now become irregular. Did have a couple episodes of diarrhea on 11/22 with associated nausea, denies vomiting.  Also endorses intermittent SOB at rest and upon exertion that she feels is new from her baseline pregnancy SOB.  Denies palpitations, CP, HA, new swelling.  Denies VB or LOF, however states she feels as though she lost her mucus plug 11/24.  +FM endorsed. 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) 06/14/2023   History of placenta abruption 05/28/2023   Supervision of other normal pregnancy, antepartum 05/20/2023     Past Medical History: Past Medical History:  Diagnosis Date   Depression    Placenta abruption, delivered, current hospitalization 04/15/2012   Sickle cell trait (HCC)    SVD (spontaneous vaginal delivery) 04/15/2012    Past Surgical History: Past Surgical History:  Procedure Laterality Date   ORIF ANKLE FRACTURE Left 02/28/2016   Procedure: OPEN REDUCTION INTERNAL FIXATION (ORIF) LEFT ANKLE FRACTURE;  Surgeon: Samson Frederic, MD;  Location: [facility] OR;  Service: Orthopedics;   Laterality: Left;    Obstetrical History: OB History     Gravida  4   Para  3   Term  3   Preterm      AB      Living  3      SAB      IAB      Ectopic      Multiple  0   Live Births  3           Gynecological History: OB History     Gravida  4   Para  3   Term  3   Preterm      AB      Living  3      SAB      IAB      Ectopic      Multiple  0   Live Births  3           Social History: Social History   Socioeconomic History   Marital status: Single    Spouse name: Not on file   Number of children: Not on file   Years of education: Not on file   Highest education level: Not on file  Occupational History   Not on file  Tobacco Use   Smoking status: Former    Current packs/day: 0.00    Types: Cigarettes    Quit date: 02/01/2012    Years since quitting: 11.5   Smokeless tobacco: Never  Vaping Use   Vaping status: Former   Substances: Flavoring  Substance and Sexual Activity  Alcohol use: No   Drug use: No   Sexual activity: Yes    Birth control/protection: None    Comment: implant removed  Other Topics Concern   Not on file  Social History Narrative   Not on file   Social Determinants of Health   Financial Resource Strain: Not on file  Food Insecurity: Not on file  Transportation Needs: Not on file  Physical Activity: Not on file  Stress: Not on file  Social Connections: Not on file    Family History: Family History  Problem Relation Age of Onset   Other Neg Hx     Allergies: Allergies  Allergen Reactions   Latex Other (See Comments)    Gloves irritate eczema on hands, no problems elsewhere per patient    Medications Prior to Admission  Medication Sig Dispense Refill Last Dose   cyclobenzaprine (FLEXERIL) 10 MG tablet Take 1 tablet (10 mg total) by mouth every 8 (eight) hours as needed for muscle spasms. (Patient not taking: Reported on 08/04/2023) 30 tablet 0    Prenatal MV & Min w/FA-DHA  (PRENATAL GUMMIES) 0.18-25 MG CHEW Chew by mouth.       Review of Systems - History obtained from the patient   Blood pressure (!) 108/92, pulse (!) 131, resp. rate 18, height 5\' 6"  (1.676 m), weight 90.5 kg, last menstrual period 03/08/2023, SpO2 100%. BP (!) 108/92   Pulse (!) 131   Resp 18   Ht 5\' 6"  (1.676 m)   Wt 90.5 kg   LMP 03/08/2023   SpO2 100%   BMI 32.22 kg/m  General appearance: alert, cooperative, appears stated age, and appears to feel poorly Lungs: clear to auscultation bilaterally Heart:  tachycardia with ?S4, no murmur or rub  Abdomen:  mild TTP globally to gravid abdomen  Extremities: extremities normal, atraumatic, no cyanosis or edema Skin: Skin color, texture, turgor normal. No rashes or lesions Neurologic: Grossly normal  Baseline: 160 bpm, Variability: Fair (1-6 bpm), Accelerations: 10x10 accels, and Decelerations: variables decelerations, overall appropriate for gestational age None     Prenatal labs: ABO, Rh: O/Positive/-- (09/13 1035) Antibody: Negative (09/13 1035) Rubella: 20.50 (09/13 1035) RPR: Non Reactive (09/13 1035)  HBsAg: Negative (09/13 1035)  HIV: Non Reactive (09/13 1035)  GBS:    1 hr Glucola not done yet  Genetic screening LR, AFP not done, Horizon neg x2, carrier for Alpha thal and Hemoglobin C  Anatomy US normal    Assessment: Krishonda Loeffel is a 32 y.o. female presenting for concern for DKA w/o prior hx of diabetes.   Plan: #[redacted] weeks gestation #DKA: r/o cardiac etiology for constellation of symptoms with normal EKG and normal BNP and Troponin.  Glucose 469, Anion gap 16, ABG 7.27//CO2 15//Po2 102 (saturation 95%)// base def -17//Bicarb 6.9//Hgb 14.4 - Admit for observation and endo tool  - Diabetes coordinator and Dietician consulted   - f/u BHB   #Patchy LLL opacity: atelectasis vs PNA on CXR - f/u procal before antibiotics as down trending chronic leukocytosis   #Polycythemia: likely concentration given DKA - trend  with repeat labs in AM after treatment / hydration for DKA    Hessie Dibble 08/09/2023, 12:22 PM

## 2023-08-09 NOTE — MAU Note (Signed)
RT at bedside.

## 2023-08-09 NOTE — MAU Note (Signed)
RN called RT to inform them of blood gas order. RT aware and will come draw gas.

## 2023-08-10 ENCOUNTER — Inpatient Hospital Stay (HOSPITAL_COMMUNITY): Payer: PRIVATE HEALTH INSURANCE | Admitting: Anesthesiology

## 2023-08-10 DIAGNOSIS — E0810 Diabetes mellitus due to underlying condition with ketoacidosis without coma: Secondary | ICD-10-CM

## 2023-08-10 DIAGNOSIS — O99214 Obesity complicating childbirth: Secondary | ICD-10-CM

## 2023-08-10 DIAGNOSIS — Z3A26 26 weeks gestation of pregnancy: Secondary | ICD-10-CM

## 2023-08-10 DIAGNOSIS — O42013 Preterm premature rupture of membranes, onset of labor within 24 hours of rupture, third trimester: Secondary | ICD-10-CM

## 2023-08-10 DIAGNOSIS — O24429 Gestational diabetes mellitus in childbirth, unspecified control: Secondary | ICD-10-CM

## 2023-08-10 LAB — GLUCOSE, CAPILLARY
Glucose-Capillary: 101 mg/dL — ABNORMAL HIGH (ref 70–99)
Glucose-Capillary: 121 mg/dL — ABNORMAL HIGH (ref 70–99)
Glucose-Capillary: 128 mg/dL — ABNORMAL HIGH (ref 70–99)
Glucose-Capillary: 133 mg/dL — ABNORMAL HIGH (ref 70–99)
Glucose-Capillary: 154 mg/dL — ABNORMAL HIGH (ref 70–99)
Glucose-Capillary: 168 mg/dL — ABNORMAL HIGH (ref 70–99)
Glucose-Capillary: 181 mg/dL — ABNORMAL HIGH (ref 70–99)
Glucose-Capillary: 194 mg/dL — ABNORMAL HIGH (ref 70–99)
Glucose-Capillary: 195 mg/dL — ABNORMAL HIGH (ref 70–99)
Glucose-Capillary: 196 mg/dL — ABNORMAL HIGH (ref 70–99)
Glucose-Capillary: 198 mg/dL — ABNORMAL HIGH (ref 70–99)
Glucose-Capillary: 199 mg/dL — ABNORMAL HIGH (ref 70–99)
Glucose-Capillary: 214 mg/dL — ABNORMAL HIGH (ref 70–99)
Glucose-Capillary: 216 mg/dL — ABNORMAL HIGH (ref 70–99)
Glucose-Capillary: 218 mg/dL — ABNORMAL HIGH (ref 70–99)
Glucose-Capillary: 222 mg/dL — ABNORMAL HIGH (ref 70–99)
Glucose-Capillary: 222 mg/dL — ABNORMAL HIGH (ref 70–99)
Glucose-Capillary: 250 mg/dL — ABNORMAL HIGH (ref 70–99)

## 2023-08-10 LAB — BASIC METABOLIC PANEL
Anion gap: 8 (ref 5–15)
BUN: 11 mg/dL (ref 6–20)
CO2: 18 mmol/L — ABNORMAL LOW (ref 22–32)
Calcium: 9.1 mg/dL (ref 8.9–10.3)
Chloride: 111 mmol/L (ref 98–111)
Creatinine, Ser: 0.71 mg/dL (ref 0.44–1.00)
GFR, Estimated: >60 mL/min (ref >60)
Glucose, Bld: 131 mg/dL — ABNORMAL HIGH (ref 70–99)
Potassium: 3.5 mmol/L (ref 3.5–5.1)
Sodium: 137 mmol/L (ref 135–145)

## 2023-08-10 LAB — CBC
HCT: 31.2 % — ABNORMAL LOW (ref 36.0–46.0)
Hemoglobin: 11.3 g/dL — ABNORMAL LOW (ref 12.0–15.0)
MCH: 26.2 pg (ref 26.0–34.0)
MCHC: 36.2 g/dL — ABNORMAL HIGH (ref 30.0–36.0)
MCV: 72.4 fL — ABNORMAL LOW (ref 80.0–100.0)
Platelets: 184 10*3/uL (ref 150–400)
RBC: 4.31 MIL/uL (ref 3.87–5.11)
RDW: 13.1 % (ref 11.5–15.5)
WBC: 10.7 10*3/uL — ABNORMAL HIGH (ref 4.0–10.5)
nRBC: 0 % (ref 0.0–0.2)

## 2023-08-10 LAB — TYPE AND SCREEN
ABO/RH(D): O POS
Antibody Screen: NEGATIVE

## 2023-08-10 LAB — BETA-HYDROXYBUTYRIC ACID: Beta-Hydroxybutyric Acid: 0.07 mmol/L (ref 0.05–0.27)

## 2023-08-10 LAB — GC/CHLAMYDIA PROBE AMP (~~LOC~~) NOT AT ARMC
Chlamydia: NEGATIVE
Comment: NEGATIVE
Comment: NORMAL
Neisseria Gonorrhea: NEGATIVE

## 2023-08-10 MED ORDER — LACTATED RINGERS IV SOLN: INTRAVENOUS | Status: AC

## 2023-08-10 MED ORDER — EPHEDRINE 5 MG/ML INJ
10.0000 mg | INTRAVENOUS | Status: DC | PRN
Start: 2023-08-10 — End: 2023-08-11

## 2023-08-10 MED ORDER — BETAMETHASONE SOD PHOS & ACET 6 (3-3) MG/ML IJ SUSP
12.0000 mg | Freq: Once | INTRAMUSCULAR | Status: AC
  Administered 2023-08-10: 12 mg via INTRAMUSCULAR
  Filled 2023-08-10: qty 5

## 2023-08-10 MED ORDER — TERBUTALINE SULFATE 1 MG/ML IJ SOLN
0.2500 mg | Freq: Once | INTRAMUSCULAR | Status: AC
  Administered 2023-08-10: 0.25 mg via SUBCUTANEOUS

## 2023-08-10 MED ORDER — OXYCODONE-ACETAMINOPHEN 5-325 MG PO TABS: 2.0000 | ORAL_TABLET | ORAL | Status: DC | PRN

## 2023-08-10 MED ORDER — SOD CITRATE-CITRIC ACID 500-334 MG/5ML PO SOLN
30.0000 mL | ORAL | Status: DC | PRN
Start: 2023-08-10 — End: 2023-08-11

## 2023-08-10 MED ORDER — INSULIN ASPART 100 UNIT/ML IJ SOLN
10.0000 [IU] | Freq: Once | INTRAMUSCULAR | Status: AC
  Administered 2023-08-10: 10 [IU] via SUBCUTANEOUS

## 2023-08-10 MED ORDER — DIPHENHYDRAMINE HCL 50 MG/ML IJ SOLN: 12.5000 mg | INTRAMUSCULAR | Status: DC | PRN

## 2023-08-10 MED ORDER — PENICILLIN G POT IN DEXTROSE 60000 UNIT/ML IV SOLN
3.0000 10*6.[IU] | INTRAVENOUS | Status: DC
  Filled 2023-08-10 (×2): qty 50

## 2023-08-10 MED ORDER — LIDOCAINE HCL (PF) 1 % IJ SOLN
30.0000 mL | INTRAMUSCULAR | Status: DC | PRN
Start: 2023-08-10 — End: 2023-08-11

## 2023-08-10 MED ORDER — INSULIN STARTER KIT- PEN NEEDLES (ENGLISH)
1.0000 | Freq: Once | Status: AC
  Administered 2023-08-10: 1
  Filled 2023-08-10: qty 1

## 2023-08-10 MED ORDER — LACTATED RINGERS IV SOLN: INTRAVENOUS | Status: DC

## 2023-08-10 MED ORDER — OXYTOCIN BOLUS FROM INFUSION
333.0000 mL | Freq: Once | INTRAVENOUS | Status: AC
  Administered 2023-08-11: 333 mL via INTRAVENOUS

## 2023-08-10 MED ORDER — LACTATED RINGERS IV SOLN: 500.0000 mL | INTRAVENOUS | Status: DC | PRN

## 2023-08-10 MED ORDER — PHENYLEPHRINE 80 MCG/ML (10ML) SYRINGE FOR IV PUSH (FOR BLOOD PRESSURE SUPPORT)
80.0000 ug | PREFILLED_SYRINGE | INTRAVENOUS | Status: DC | PRN
Start: 2023-08-10 — End: 2023-08-11

## 2023-08-10 MED ORDER — MAGNESIUM SULFATE 40 GM/1000ML IV SOLN: 2.0000 g/h | INTRAVENOUS | Status: DC

## 2023-08-10 MED ORDER — ONDANSETRON HCL 4 MG/2ML IJ SOLN
4.0000 mg | Freq: Four times a day (QID) | INTRAMUSCULAR | Status: DC | PRN
  Administered 2023-08-11: 4 mg via INTRAVENOUS
  Filled 2023-08-10: qty 2

## 2023-08-10 MED ORDER — ACETAMINOPHEN 325 MG PO TABS: 650.0000 mg | ORAL_TABLET | ORAL | Status: DC | PRN

## 2023-08-10 MED ORDER — MAGNESIUM SULFATE BOLUS VIA INFUSION
6.0000 g | Freq: Once | INTRAVENOUS | Status: AC
  Administered 2023-08-10: 6 g via INTRAVENOUS
  Filled 2023-08-10: qty 1000

## 2023-08-10 MED ORDER — INSULIN NPH (HUMAN) (ISOPHANE) 100 UNIT/ML ~~LOC~~ SUSP
20.0000 [IU] | Freq: Every day | SUBCUTANEOUS | Status: DC
  Administered 2023-08-11: 20 [IU] via SUBCUTANEOUS

## 2023-08-10 MED ORDER — OXYCODONE-ACETAMINOPHEN 5-325 MG PO TABS: 1.0000 | ORAL_TABLET | ORAL | Status: DC | PRN

## 2023-08-10 MED ORDER — INSULIN ASPART 100 UNIT/ML IJ SOLN
10.0000 [IU] | Freq: Two times a day (BID) | INTRAMUSCULAR | Status: DC
  Administered 2023-08-10 – 2023-08-12 (×4): 10 [IU] via SUBCUTANEOUS

## 2023-08-10 MED ORDER — FENTANYL-BUPIVACAINE-NACL 0.5-0.125-0.9 MG/250ML-% EP SOLN
12.0000 mL/h | EPIDURAL | Status: DC | PRN
  Administered 2023-08-10: 12 mL/h via EPIDURAL

## 2023-08-10 MED ORDER — INSULIN NPH (HUMAN) (ISOPHANE) 100 UNIT/ML ~~LOC~~ SUSP
10.0000 [IU] | Freq: Every day | SUBCUTANEOUS | Status: DC
  Administered 2023-08-10 – 2023-08-11 (×2): 10 [IU] via SUBCUTANEOUS
  Filled 2023-08-10: qty 10

## 2023-08-10 MED ORDER — INSULIN ASPART 100 UNIT/ML IJ SOLN
0.0000 [IU] | Freq: Three times a day (TID) | INTRAMUSCULAR | Status: DC
  Administered 2023-08-10: 8 [IU] via SUBCUTANEOUS
  Administered 2023-08-11: 11 [IU] via SUBCUTANEOUS
  Administered 2023-08-12: 3 [IU] via SUBCUTANEOUS
  Administered 2023-08-12: 2 [IU] via SUBCUTANEOUS

## 2023-08-10 MED ORDER — MAGNESIUM SULFATE 40 GM/1000ML IV SOLN
INTRAVENOUS | Status: AC
  Administered 2023-08-10: 2 g/h via INTRAVENOUS
  Filled 2023-08-10: qty 1000

## 2023-08-10 MED ORDER — LACTATED RINGERS IV SOLN
500.0000 mL | Freq: Once | INTRAVENOUS | Status: DC
Start: 2023-08-11 — End: 2023-08-12

## 2023-08-10 MED ORDER — INSULIN ASPART 100 UNIT/ML IJ SOLN: 0.0000 [IU] | Freq: Three times a day (TID) | INTRAMUSCULAR | Status: DC

## 2023-08-10 MED ORDER — OXYTOCIN-SODIUM CHLORIDE 30-0.9 UT/500ML-% IV SOLN
2.5000 [IU]/h | INTRAVENOUS | Status: DC
  Filled 2023-08-10: qty 500

## 2023-08-10 MED ORDER — FENTANYL-BUPIVACAINE-NACL 0.5-0.125-0.9 MG/250ML-% EP SOLN
EPIDURAL | Status: AC
  Filled 2023-08-10: qty 250

## 2023-08-10 MED ORDER — TERBUTALINE SULFATE 1 MG/ML IJ SOLN
INTRAMUSCULAR | Status: AC
  Administered 2023-08-10: 0.25 mg via SUBCUTANEOUS
  Filled 2023-08-10: qty 1

## 2023-08-10 MED ORDER — LIVING WELL WITH DIABETES BOOK
Freq: Once | Status: AC
  Filled 2023-08-10: qty 1

## 2023-08-10 MED ORDER — SODIUM CHLORIDE 0.9 % IV SOLN
5.0000 10*6.[IU] | Freq: Once | INTRAVENOUS | Status: AC
  Administered 2023-08-10: 5 10*6.[IU] via INTRAVENOUS
  Filled 2023-08-10: qty 5

## 2023-08-10 NOTE — Anesthesia Preprocedure Evaluation (Incomplete)
Anesthesia Evaluation    Airway        Dental   Pulmonary former smoker          Cardiovascular      Neuro/Psych    GI/Hepatic   Endo/Other    Renal/GU      Musculoskeletal   Abdominal   Peds  Hematology   Anesthesia Other Findings   Reproductive/Obstetrics                              Anesthesia Physical Anesthesia Plan Anesthesia Quick Evaluation

## 2023-08-10 NOTE — Progress Notes (Signed)
Pt called out requesting for pain medication. Found pt in tears and withering in pain. Attempting to place pt on FM and assessing if pt is contracting. Pt unsure if it's UC or GI pain since she has not had a MB in three days. Pt also want to attempt to have a BM. Called Dr. Jolayne Panther to assess and r/o PTL. MD at bedside SVE 10cm with bulging bag. Called L&D and transferred pt via bed. Report given to Gearldine Bienenstock, rn

## 2023-08-10 NOTE — Progress Notes (Signed)
On further chart review patient had G2 and G3 deliveries requiring GBS ppx.  02/02/12 Ucx also isolated GBS.  Given hx of GBS bacteruria and GBS ppx in last two prior gestations and prematurity gestational age of potential delivery have started PCN ppx for GBS.  Patient was also complaining of "need to push" with ctx, was given another round of terb at 2224.  Will gather labs for potential epidural for pain control, however discussed the risk of maternal hypotension and the concern the baby may not tolerate softer blood pressures.  Pt understanding.    Other Chart Review:  07/23/23 @[redacted]w[redacted]d , cephalic, anterior placental lie, AFI wnl, EFW 775gm, 93%, AC 89%, HC 9%, normal anatomy   #DKA: originally admitted for DKA on 08/09/23, off endo tool 11/26  - NPH 20U Qam, 10U at bedtime - moderate dose SSI  - hypoglycemia protocol   #Hx of placental abruption: 2013 at term, followed by 2 normal TSVDs #Alpha thal silent carrier #Hemoglobin C variant carrier   Mittie Bodo, MD Family Medicine - Obstetrics Fellow

## 2023-08-10 NOTE — Progress Notes (Signed)
Patient ID: Linda Barry, female   DOB: [birth year], 32 y.o.   MRN: [id number] FACULTY PRACTICE ANTEPARTUM(COMPREHENSIVE) NOTE  Linda Barry is a 32 y.o. [phone] with Estimated Date of Delivery: 11/13/23   By  early ultrasound [redacted]w[redacted]d  who is admitted for DKA.    Fetal presentation is unsure. Length of Stay:  1  Days  Date of admission:08/09/2023  Subjective: No complaints, feels better Patient reports the fetal movement as active. Patient reports uterine contraction  activity as none. Patient reports  vaginal bleeding as none. Patient describes fluid per vagina as None.  Vitals:  Blood pressure 114/66, pulse (!) 108, temperature 98.3 F (36.8 C), temperature source Oral, resp. rate 19, height 5\' 6"  (1.676 m), weight 90.5 kg, last menstrual period 03/08/2023, SpO2 100%. Vitals:   08/10/23 0434 08/10/23 0740 08/10/23 1119 08/10/23 1516  BP:  103/62 (!) 98/51 114/66  Pulse:  92 (!) 107 (!) 108  Resp: 18 18 16 19   Temp: 98.4 F (36.9 C) 97.6 F (36.4 C) 97.8 F (36.6 C) 98.3 F (36.8 C)  TempSrc: Oral Oral Oral Oral  SpO2:  100% 95% 100%  Weight:      Height:       Physical Examination:  General appearance - alert, well appearing, and in no distress Abdomen - soft, nontender, nondistended, no masses or organomegaly Fundal Height:  size equals dates Pelvic Exam:  examination not indicated Cervical Exam: Not evaluated.  Extremities: extremities normal, atraumatic, no cyanosis or edema with DTRs 2+ bilaterally Membranes:intact  Fetal Monitoring:     appropriate  Labs:  Results for orders placed or performed during the hospital encounter of 08/09/23 (from the past 24 hour(s))  Glucose, capillary   Collection Time: 08/09/23  3:48 PM  Result Value Ref Range   Glucose-Capillary 422 (H) 70 - 99 mg/dL  Glucose, capillary   Collection Time: 08/09/23  4:26 PM  Result Value Ref Range   Glucose-Capillary 449 (H) 70 - 99 mg/dL  Glucose, capillary   Collection Time: 08/09/23  4:59 PM   Result Value Ref Range   Glucose-Capillary 380 (H) 70 - 99 mg/dL  Glucose, capillary   Collection Time: 08/09/23  5:30 PM  Result Value Ref Range   Glucose-Capillary 424 (H) 70 - 99 mg/dL  Glucose, capillary   Collection Time: 08/09/23  6:07 PM  Result Value Ref Range   Glucose-Capillary 385 (H) 70 - 99 mg/dL  Glucose, capillary   Collection Time: 08/09/23  6:48 PM  Result Value Ref Range   Glucose-Capillary 346 (H) 70 - 99 mg/dL  Glucose, capillary   Collection Time: 08/09/23  7:21 PM  Result Value Ref Range   Glucose-Capillary 312 (H) 70 - 99 mg/dL  Glucose, capillary   Collection Time: 08/09/23  7:56 PM  Result Value Ref Range   Glucose-Capillary 269 (H) 70 - 99 mg/dL  Glucose, capillary   Collection Time: 08/09/23  8:31 PM  Result Value Ref Range   Glucose-Capillary 314 (H) 70 - 99 mg/dL  Beta-hydroxybutyric acid   Collection Time: 08/09/23  8:51 PM  Result Value Ref Range   Beta-Hydroxybutyric Acid 0.25 0.05 - 0.27 mmol/L  Glucose, capillary   Collection Time: 08/09/23  9:04 PM  Result Value Ref Range   Glucose-Capillary 326 (H) 70 - 99 mg/dL  Glucose, capillary   Collection Time: 08/09/23  9:36 PM  Result Value Ref Range   Glucose-Capillary 310 (H) 70 - 99 mg/dL  Glucose, capillary   Collection Time: 08/09/23 10:06 PM  Result Value Ref Range   Glucose-Capillary 309 (H) 70 - 99 mg/dL  Glucose, capillary   Collection Time: 08/09/23 10:39 PM  Result Value Ref Range   Glucose-Capillary 283 (H) 70 - 99 mg/dL  Glucose, capillary   Collection Time: 08/09/23 11:13 PM  Result Value Ref Range   Glucose-Capillary 273 (H) 70 - 99 mg/dL  Glucose, capillary   Collection Time: 08/09/23 11:45 PM  Result Value Ref Range   Glucose-Capillary 255 (H) 70 - 99 mg/dL  Glucose, capillary   Collection Time: 08/10/23 12:19 AM  Result Value Ref Range   Glucose-Capillary 222 (H) 70 - 99 mg/dL  Glucose, capillary   Collection Time: 08/10/23 12:53 AM  Result Value Ref Range    Glucose-Capillary 198 (H) 70 - 99 mg/dL  Glucose, capillary   Collection Time: 08/10/23  1:56 AM  Result Value Ref Range   Glucose-Capillary 214 (H) 70 - 99 mg/dL  Glucose, capillary   Collection Time: 08/10/23  2:29 AM  Result Value Ref Range   Glucose-Capillary 196 (H) 70 - 99 mg/dL  Glucose, capillary   Collection Time: 08/10/23  3:33 AM  Result Value Ref Range   Glucose-Capillary 181 (H) 70 - 99 mg/dL  Beta-hydroxybutyric acid   Collection Time: 08/10/23  4:34 AM  Result Value Ref Range   Beta-Hydroxybutyric Acid 0.07 0.05 - 0.27 mmol/L  Glucose, capillary   Collection Time: 08/10/23  4:34 AM  Result Value Ref Range   Glucose-Capillary 168 (H) 70 - 99 mg/dL  Glucose, capillary   Collection Time: 08/10/23  5:34 AM  Result Value Ref Range   Glucose-Capillary 194 (H) 70 - 99 mg/dL  Glucose, capillary   Collection Time: 08/10/23  6:34 AM  Result Value Ref Range   Glucose-Capillary 121 (H) 70 - 99 mg/dL  Glucose, capillary   Collection Time: 08/10/23  7:34 AM  Result Value Ref Range   Glucose-Capillary 101 (H) 70 - 99 mg/dL  Basic metabolic panel   Collection Time: 08/10/23  7:50 AM  Result Value Ref Range   Sodium 137 135 - 145 mmol/L   Potassium 3.5 3.5 - 5.1 mmol/L   Chloride 111 98 - 111 mmol/L   CO2 18 (L) 22 - 32 mmol/L   Glucose, Bld 131 (H) 70 - 99 mg/dL   BUN 11 6 - 20 mg/dL   Creatinine, Ser 4.40 0.44 - 1.00 mg/dL   Calcium 9.1 8.9 - 10.2 mg/dL   GFR, Estimated >72 >53 mL/min   Anion gap 8 5 - 15  Glucose, capillary   Collection Time: 08/10/23  8:37 AM  Result Value Ref Range   Glucose-Capillary 199 (H) 70 - 99 mg/dL  Glucose, capillary   Collection Time: 08/10/23  9:39 AM  Result Value Ref Range   Glucose-Capillary 218 (H) 70 - 99 mg/dL  Glucose, capillary   Collection Time: 08/10/23 10:15 AM  Result Value Ref Range   Glucose-Capillary 216 (H) 70 - 99 mg/dL  Glucose, capillary   Collection Time: 08/10/23 10:44 AM  Result Value Ref Range    Glucose-Capillary 222 (H) 70 - 99 mg/dL  Glucose, capillary   Collection Time: 08/10/23 11:20 AM  Result Value Ref Range   Glucose-Capillary 195 (H) 70 - 99 mg/dL  Glucose, capillary   Collection Time: 08/10/23 12:18 PM  Result Value Ref Range   Glucose-Capillary 154 (H) 70 - 99 mg/dL  Glucose, capillary   Collection Time: 08/10/23  1:19 PM  Result Value Ref Range   Glucose-Capillary  128 (H) 70 - 99 mg/dL  Glucose, capillary   Collection Time: 08/10/23  2:18 PM  Result Value Ref Range   Glucose-Capillary 133 (H) 70 - 99 mg/dL    Imaging Studies:    DG Chest Port 1V same Day  Result Date: 08/09/2023 CLINICAL DATA:  Shortness of breath EXAM: PORTABLE CHEST 1 VIEW COMPARISON:  None Available. FINDINGS: Normal lung volumes. Patchy left lower lung opacity. No pleural effusion or pneumothorax. The heart size and mediastinal contours are within normal limits. No acute osseous abnormality. IMPRESSION: Patchy left lower lung opacity may represent atelectasis or pneumonia. Electronically Signed   By: Agustin Cree M.D.   On: 08/09/2023 10:19     Medications:  Scheduled  insulin aspart  10 Units Subcutaneous BID AC   insulin NPH Human  10 Units Subcutaneous QAC supper   [START ON 08/11/2023] insulin NPH Human  20 Units Subcutaneous QAC breakfast   insulin starter kit- pen needles  1 kit Other Once   living well with diabetes book   Does not apply Once   I have reviewed the patient's current medications.  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

## 2023-08-10 NOTE — Progress Notes (Signed)
Nutrition consult for Education  Nutrition Dx: Food and nutrition-related knowledge deficit r/t no previous education aeb newly diagnosed GDM.    Nutrition education consult for Carbohydrate Modified Gestational Diabetic Diet completed.  "Meal  plan for gestational diabetics" handout given to patient.   Basic concepts reviewed: 3 meals and 3 snacks. Pt eats late dinner, 10 pm, so may need to incorporate 2 of her snacks in the afternoon. Identification of higher CHO foods Spacing of meals and snacks. Remember to always eat and not skip meals Elimination of sugary beverages, high sugar foods  Meals were planned using food items from her typical diet. Receives lunch at work.  Questions answered.  Patient verbalizes understanding.  Further education outpt is planned

## 2023-08-10 NOTE — Progress Notes (Signed)
Patient ID: Linda Barry, female   DOB: 12/15/1990, 32 y.o.   MRN: [id number] Called by RN to evaluate patient complaining of abdominal pain and in tears. Patient is uncertain if she is having contractions. She reports onset of the pain around 8 pm. She also noted some vaginal bleeding for which she wore a pad. She reports the need to have a bowel movement but feels constipated. She reports good fetal movement. She denies leakage of fluid  Blood pressure 98/61, pulse (!) 107, temperature 98 F (36.7 C), temperature source Oral, resp. rate 18, height 5\' 6"  (1.676 m), weight 90.5 kg, last menstrual period 03/08/2023, SpO2 100%. GENERAL: Well-developed, well-nourished female in no acute distress.  ABDOMEN: Soft, nontender, gravid SVE: bulging bag, no palpable cervix. Chaperone present during the pelvic exam EXTREMITIES: No cyanosis, clubbing, or edema, 2+ distal pulses.  Bedside ultrasound reveals viable fetus in cephalic presentation  A/P 32 yo G4P3 at [redacted]w[redacted]d admitted in DKA now with preterm labor - Patient transferred to labor and delivery - Terbutaline provided for tocolysis - Magnesium sulfate started for neuro-protection - First dose of BMZ given now - Continue close monitoring

## 2023-08-10 NOTE — Anesthesia Preprocedure Evaluation (Addendum)
Anesthesia Evaluation  Patient identified by MRN, date of birth, ID band Patient awake    Reviewed: Allergy & Precautions, NPO status , Patient's Chart, lab work & pertinent test results  Airway Mallampati: III  TM Distance: >3 FB Neck ROM: Full    Dental no notable dental hx.    Pulmonary neg pulmonary ROS, former smoker   Pulmonary exam normal breath sounds clear to auscultation       Cardiovascular negative cardio ROS Normal cardiovascular exam Rhythm:Regular Rate:Normal     Neuro/Psych  PSYCHIATRIC DISORDERS  Depression    negative neurological ROS     GI/Hepatic negative GI ROS, Neg liver ROS,,,  Endo/Other  diabetes, Poorly Controlled    Renal/GU negative Renal ROS  negative genitourinary   Musculoskeletal negative musculoskeletal ROS (+)    Abdominal   Peds  Hematology  (+) Blood dyscrasia, Sickle cell trait   Anesthesia Other Findings [redacted]w[redacted]d gestation presented yesterday in DKA. Anion gap now closed. Began having contractions today and found to be 10cm dilated.   Reproductive/Obstetrics (+) Pregnancy                             Anesthesia Physical Anesthesia Plan  ASA: 3  Anesthesia Plan: Epidural   Post-op Pain Management:    Induction:   PONV Risk Score and Plan: Treatment may vary due to age or medical condition  Airway Management Planned: Natural Airway  Additional Equipment:   Intra-op Plan:   Post-operative Plan:   Informed Consent: I have reviewed the patients History and Physical, chart, labs and discussed the procedure including the risks, benefits and alternatives for the proposed anesthesia with the patient or authorized representative who has indicated his/her understanding and acceptance.       Plan Discussed with: Anesthesiologist  Anesthesia Plan Comments: (Patient identified. Risks, benefits, options discussed with patient including but not  limited to bleeding, infection, nerve damage, paralysis, failed block, incomplete pain control, headache, blood pressure changes, nausea, vomiting, reactions to medication, itching, and post partum back pain. Confirmed with bedside nurse the patient's most recent platelet count. Confirmed with the patient that they are not taking any anticoagulation, have any bleeding history or any family history of bleeding disorders. Patient expressed understanding and wishes to proceed. All questions were answered. )       Anesthesia Quick Evaluation

## 2023-08-10 NOTE — Inpatient Diabetes Management (Addendum)
Inpatient Diabetes Program Recommendations  Diabetes Treatment Program Recommendations  ADA Standards of Care Diabetes in Pregnancy Target Glucose Ranges:  Fasting: 70 - 95 mg/dL 1 hr postprandial: Less than 140mg /dL (from first bite of meal) 2 hr postprandial: Less than 120 mg/dL (from first bite of meal)    Lab Results  Component Value Date   GLUCAP 128 (H) 08/10/2023   HGBA1C 5.4 05/28/2023    Review of Glycemic Control  Latest Reference Range & Units 08/10/23 04:34 08/10/23 05:34 08/10/23 06:34 08/10/23 07:34 08/10/23 08:37  Glucose-Capillary 70 - 99 mg/dL 409 (H) 811 (H) 914 (H) 101 (H) 199 (H)  (H): Data is abnormally high Diabetes history: New onset DM Outpatient Diabetes medications: none Current orders for Inpatient glycemic control: IV insulin  Inpatient Diabetes Program Recommendations:    Noted new onset DM for DKA. Continue IV insulin. Awaiting labs.  Will plan to see.  Ordered LWWDM, insulin starter kit, dietitian and outpatient education.   Addendum: Spoke with patient at length regarding new onset diabetes in pregnancy. Patient admits to recent fatigue and vision changes.  Reviewed patient's last A1c of 5.4% and would anticipate new result to be elevated and overall accuracy in pregnancy. Explained what a A1c is and what it measures. Also reviewed goal A1c with patient, importance of good glucose control @ home, and blood sugar goals. Reviewed patho of DM in pregnancy, differences between T2DM vs GDM, DKA, role of pancreas, role of insulin, survival skills, interventions, how to check CBGs with meter and testing supplies, CGM using Freestyle libre 3, hyperinsulinemia in neonate, increased risk for type 2 DM, hormonal fluctuations in pregnancy, vascular changes and commorbidities from poor glycemic control. Education provided on recommended frequency of CBGs included post prandial checks and when to call MD. CGM placed on patient's right upper arm. Discussed how to  apply, application on device, when to call MD, target goals in pregnancy, and how to obtain additional sensors. Reviewed with RN to verify using fingersticks and percentage of range. Recommend using CGM once off Endotool.  Patient will need meter and testing strips at discharge. Reviewed relion products.  Admits to occasional sodas and sweets, however, now plans to make adjustments.  Reviewed alternatives to sugary beverages, importance of protein, limiting CHO, basic CHO counting and nutritional labels. Educated patient and spouse on insulin pen use at home. Reviewed contents of insulin flexpen starter kit. Reviewed all steps if insulin pen including attachment of needle, 2-unit air shot, dialing up dose, giving injection, removing needle, disposal of sharps, storage of unused insulin, disposal of insulin etc. Patient able to provide successful return demonstration. Also reviewed troubleshooting with insulin pen. MD to give patient Rxs for insulin pens and insulin pen needles.  No questions at this time.  Discussed plan of care with Dr Despina Hidden. Plan for transition off IV insulin with NPH/Regular insulin when appropriate. Continue IV insulin at this time given drip rates.   If rates continue to exceed >28 units/hr can pause IV insulin for 30 minutes. In endotool program can discontinue therapy and restart algorithm.   Plan to have DM coordinator follow up on 11/27.   Thanks, Lujean Rave, MSN, RNC-OB Diabetes Coordinator [phone] (8a-5p)

## 2023-08-10 NOTE — Social Work (Signed)
CSW received consult: SDOH admission screening questions regarding food and housing; 5 P's Prenatal Substance Abuse Screening Questionnaire flagged yes to one of questions. CSW met with MOB to offer support and complete assessment.    CSW met with MOB at bedside and introduced CSW role. CSW observed MOB lying in bed. MOB presented pleasant and was receptive to CSW visit. CSW explained the reason for the visit and assessed further. MOB reported that she receives WIC/FS, however, there are times when she and her family of four run out of food. MOB reported she works for [employer] and her partner is employed but his hours are not consistent. 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 CSW provided MOB with a list of food resources and encouraged her to follow up. MOB reported that she has an appointment with the [facility] program on December 9th and that her Kindergarten age son participates in the program.   CSW inquired about housing concerns. MOB denied housing concerns but stated that her utilities power bill is "extremely high." CSW asked MOB the amount. MOB reported that her bill is about six hundred dollars. MOB reported that she had to ask the company for a two-week extension. MOB reported that she is expecting to use her Christmas Bonus to pay part of the bill. CSW provided resources for utilities assistance. MOB reported that she went to [facility] and was informed to return in January 2025 for assistance. CSW encouraged MOB to reach out to the additional resources as well.   CSW informed MOB about the 5' P's Substance Abuse Questionnaire that asked, "Did any of your parents have a problem with drug use?" MOB stated, yes. MOB denied concerns with substance use.   Vivi Barrack, MSW, LCSW Clinical Social Worker  [phone] 08/10/2023   10:55 AM

## 2023-08-11 ENCOUNTER — Encounter (HOSPITAL_COMMUNITY): Payer: Self-pay | Admitting: Obstetrics and Gynecology

## 2023-08-11 ENCOUNTER — Other Ambulatory Visit (HOSPITAL_COMMUNITY): Payer: Self-pay

## 2023-08-11 LAB — CBC
HCT: 26.2 % — ABNORMAL LOW (ref 36.0–46.0)
Hemoglobin: 9.2 g/dL — ABNORMAL LOW (ref 12.0–15.0)
MCH: 25.7 pg — ABNORMAL LOW (ref 26.0–34.0)
MCHC: 35.1 g/dL (ref 30.0–36.0)
MCV: 73.2 fL — ABNORMAL LOW (ref 80.0–100.0)
Platelets: 173 10*3/uL (ref 150–400)
RBC: 3.58 MIL/uL — ABNORMAL LOW (ref 3.87–5.11)
RDW: 13.2 % (ref 11.5–15.5)
WBC: 13.7 10*3/uL — ABNORMAL HIGH (ref 4.0–10.5)
nRBC: 0 % (ref 0.0–0.2)

## 2023-08-11 LAB — GLUCOSE, CAPILLARY
Glucose-Capillary: 360 mg/dL — ABNORMAL HIGH (ref 70–99)
Glucose-Capillary: 312 mg/dL — ABNORMAL HIGH (ref 70–99)
Glucose-Capillary: 340 mg/dL — ABNORMAL HIGH (ref 70–99)
Glucose-Capillary: 304 mg/dL — ABNORMAL HIGH (ref 70–99)
Glucose-Capillary: 289 mg/dL — ABNORMAL HIGH (ref 70–99)
Glucose-Capillary: 272 mg/dL — ABNORMAL HIGH (ref 70–99)
Glucose-Capillary: 336 mg/dL — ABNORMAL HIGH (ref 70–99)

## 2023-08-11 LAB — HEMOGLOBIN A1C
Hgb A1c MFr Bld: 9.1 % — ABNORMAL HIGH (ref 4.8–5.6)
Mean Plasma Glucose: 214.47 mg/dL

## 2023-08-11 LAB — COMPREHENSIVE METABOLIC PANEL
ALT: 14 U/L (ref 0–44)
AST: 16 U/L (ref 15–41)
Albumin: 2.1 g/dL — ABNORMAL LOW (ref 3.5–5.0)
Alkaline Phosphatase: 61 U/L (ref 38–126)
Anion gap: 11 (ref 5–15)
BUN: 12 mg/dL (ref 6–20)
CO2: 13 mmol/L — ABNORMAL LOW (ref 22–32)
Calcium: 8.1 mg/dL — ABNORMAL LOW (ref 8.9–10.3)
Chloride: 106 mmol/L (ref 98–111)
Creatinine, Ser: 0.69 mg/dL (ref 0.44–1.00)
GFR, Estimated: >60 mL/min (ref >60)
Glucose, Bld: 252 mg/dL — ABNORMAL HIGH (ref 70–99)
Potassium: 2.9 mmol/L — ABNORMAL LOW (ref 3.5–5.1)
Sodium: 130 mmol/L — ABNORMAL LOW (ref 135–145)
Total Bilirubin: 0.5 mg/dL (ref <1.2)
Total Protein: 5.6 g/dL — ABNORMAL LOW (ref 6.5–8.1)

## 2023-08-11 LAB — RPR: RPR Ser Ql: NONREACTIVE

## 2023-08-11 LAB — HIV ANTIBODY (ROUTINE TESTING W REFLEX): HIV Screen 4th Generation wRfx: NONREACTIVE

## 2023-08-11 MED ORDER — SODIUM CHLORIDE 0.9% FLUSH
3.0000 mL | Freq: Two times a day (BID) | INTRAVENOUS | Status: DC
  Administered 2023-08-11 (×2): 3 mL via INTRAVENOUS

## 2023-08-11 MED ORDER — DIBUCAINE (PERIANAL) 1 % EX OINT: 1.0000 | TOPICAL_OINTMENT | CUTANEOUS | Status: DC | PRN

## 2023-08-11 MED ORDER — ACETAMINOPHEN 325 MG PO TABS
650.0000 mg | ORAL_TABLET | ORAL | Status: DC | PRN
  Administered 2023-08-11 – 2023-08-12 (×3): 650 mg via ORAL
  Filled 2023-08-11 (×3): qty 2

## 2023-08-11 MED ORDER — METHYLERGONOVINE MALEATE 0.2 MG/ML IJ SOLN
INTRAMUSCULAR | Status: AC
  Filled 2023-08-11: qty 1

## 2023-08-11 MED ORDER — MISOPROSTOL 200 MCG PO TABS
1000.0000 ug | ORAL_TABLET | Freq: Once | ORAL | Status: AC
  Administered 2023-08-11: 1000 ug via RECTAL

## 2023-08-11 MED ORDER — ONDANSETRON HCL 4 MG/2ML IJ SOLN: 4.0000 mg | INTRAMUSCULAR | Status: DC | PRN

## 2023-08-11 MED ORDER — SENNOSIDES-DOCUSATE SODIUM 8.6-50 MG PO TABS
2.0000 | ORAL_TABLET | ORAL | Status: DC
  Administered 2023-08-11 – 2023-08-12 (×3): 2 via ORAL
  Filled 2023-08-11 (×3): qty 2

## 2023-08-11 MED ORDER — TETANUS-DIPHTH-ACELL PERTUSSIS 5-2.5-18.5 LF-MCG/0.5 IM SUSY
0.5000 mL | PREFILLED_SYRINGE | Freq: Once | INTRAMUSCULAR | Status: DC
Start: 2023-08-12 — End: 2023-08-12

## 2023-08-11 MED ORDER — WITCH HAZEL-GLYCERIN EX PADS: 1.0000 | MEDICATED_PAD | CUTANEOUS | Status: DC | PRN

## 2023-08-11 MED ORDER — LIDOCAINE-EPINEPHRINE (PF) 2 %-1:200000 IJ SOLN
INTRAMUSCULAR | Status: DC | PRN
  Administered 2023-08-10: 5 mL via EPIDURAL

## 2023-08-11 MED ORDER — SODIUM CHLORIDE 0.9 % IV SOLN: 250.0000 mL | INTRAVENOUS | Status: DC | PRN

## 2023-08-11 MED ORDER — BENZOCAINE-MENTHOL 20-0.5 % EX AERO: 1.0000 | INHALATION_SPRAY | CUTANEOUS | Status: DC | PRN

## 2023-08-11 MED ORDER — MISOPROSTOL 200 MCG PO TABS
ORAL_TABLET | ORAL | Status: AC
  Filled 2023-08-11: qty 5

## 2023-08-11 MED ORDER — IBUPROFEN 600 MG PO TABS
600.0000 mg | ORAL_TABLET | Freq: Four times a day (QID) | ORAL | Status: DC
  Administered 2023-08-11 – 2023-08-12 (×6): 600 mg via ORAL
  Filled 2023-08-11 (×6): qty 1

## 2023-08-11 MED ORDER — INSULIN ASPART 100 UNIT/ML IJ SOLN
15.0000 [IU] | Freq: Once | INTRAMUSCULAR | Status: AC
  Administered 2023-08-11: 15 [IU] via SUBCUTANEOUS

## 2023-08-11 MED ORDER — PRENATAL MULTIVITAMIN CH
1.0000 | ORAL_TABLET | Freq: Every day | ORAL | Status: DC
  Administered 2023-08-11 – 2023-08-12 (×2): 1 via ORAL
  Filled 2023-08-11 (×2): qty 1

## 2023-08-11 MED ORDER — SODIUM CHLORIDE 0.9% FLUSH: 3.0000 mL | INTRAVENOUS | Status: DC | PRN

## 2023-08-11 MED ORDER — DIPHENHYDRAMINE HCL 25 MG PO CAPS: 25.0000 mg | ORAL_CAPSULE | Freq: Four times a day (QID) | ORAL | Status: DC | PRN

## 2023-08-11 MED ORDER — ZOLPIDEM TARTRATE 5 MG PO TABS: 5.0000 mg | ORAL_TABLET | Freq: Every evening | ORAL | Status: DC | PRN

## 2023-08-11 MED ORDER — METFORMIN HCL 500 MG PO TABS
500.0000 mg | ORAL_TABLET | Freq: Two times a day (BID) | ORAL | Status: DC
  Administered 2023-08-11 – 2023-08-12 (×2): 500 mg via ORAL
  Filled 2023-08-11 (×2): qty 1

## 2023-08-11 MED ORDER — SIMETHICONE 80 MG PO CHEW: 80.0000 mg | CHEWABLE_TABLET | ORAL | Status: DC | PRN

## 2023-08-11 MED ORDER — INSULIN ASPART 100 UNIT/ML IJ SOLN
10.0000 [IU] | Freq: Once | INTRAMUSCULAR | Status: AC
  Administered 2023-08-11: 10 [IU] via SUBCUTANEOUS

## 2023-08-11 MED ORDER — ONDANSETRON HCL 4 MG PO TABS: 4.0000 mg | ORAL_TABLET | ORAL | Status: DC | PRN

## 2023-08-11 MED ORDER — COCONUT OIL OIL
1.0000 | TOPICAL_OIL | Status: DC | PRN
  Administered 2023-08-12: 1 via TOPICAL

## 2023-08-11 NOTE — Lactation Note (Signed)
This note was copied from a baby's chart.  NICU Lactation Consultation Note  Patient Name: Linda Barry [id number] Date: 08/11/2023 Age:30 hours  Reason for consult: Initial assessment; Infant < 6lbs; NICU baby; Preterm <34wks; Maternal endocrine disorder Type of Endocrine Disorder?: Diabetes (T2DM (insulin))  SUBJECTIVE P4 mom consulted for initial consult of 17 hour old, [redacted]w[redacted]d infant in NICU. Mom desires to provide breast milk to infant. Set up DEBP using size 21mm flanges, discussed sizing down if areola pulls into flange. Reviewed pumping frequency, pump cleaning and sanitation. Discussed importance of breast stimulation and to strive for 8 sessions per 24 hours regardless of milk output. At the time of the consult, mom was preparing to visit baby in NICU so she plans to initiate a pumping session upon returning. Encouraged pumping in NICU at the bedside as an option as well.   Offered a [facility] loaner pump to patient as an option post maternal discharge.  Mom reports not having a pump at home but endorses having [facility] and attempting to call. Mom unable to reach office due to holiday hours. [facility] sent WIC referral to [facility]  OBJECTIVE Infant data: Mother's Current Feeding Choice: -- (NPO)  O2 Device: Jet Vent FiO2 (%): 45 %  Infant feeding assessment No data recorded  Maternal data: [id number] Vaginal, Spontaneous Has patient been taught Hand Expression?: Yes Hand Expression Comments: no colostrum noted yet Significant Breast History:: moderate breast changes during the pregnancy Current breast feeding challenges:: NICU admission Does the patient have breastfeeding experience prior to this delivery?: Yes How long did the patient breastfeed?: only baby # 3 for 4 months Pumping frequency: set up DEBP at 16 hours post-partum Flange Size: 21 (might need # 18 flanges) Risk factor for low/delayed milk supply:: prematurity, infant separation, PPH of 621 cc, T2DM  WIC Program:  Yes WIC Referral Sent?: Yes What county?: [place]  ASSESSMENT Infant:  Feeding Status: NPO  Maternal: No data recorded INTERVENTIONS/PLAN Interventions: Interventions: Breast feeding basics reviewed; Coconut oil; DEBP; Education; NICU Pumping Log; CDC Guidelines for Breast Pump Cleaning; LC Services brochure Tools: Pump; Flanges; Coconut oil Pump Education: Setup, frequency, and cleaning; Milk Storage  Plan:  Pump breasts every 3 hours (at least 8 sessions per 24 hours) Add in breast massage and/or hand expression Clean pump parts after use and sanitize daily Notify lactation if [facility] has contacted mom  Consult Status: NICU follow-up NICU Follow-up type: New admission follow up   Su Grand 08/11/2023, 5:06 PM

## 2023-08-11 NOTE — Progress Notes (Signed)
Chaplain responded to Neonatal Code Blue. Staff advised baby was fine.  No need for a Chaplain at this time.  Vernell Morgans Chaplain

## 2023-08-11 NOTE — Progress Notes (Signed)
Post Partum Day 1 Subjective: no complaints, up ad lib, voiding, tolerating PO, and + flatus  Objective: Blood pressure (!) 101/57, pulse 93, temperature 98.3 F (36.8 C), temperature source Oral, resp. rate 18, height 5\' 6"  (1.676 m), weight 90.5 kg, last menstrual period 03/08/2023, SpO2 100%, unknown if currently breastfeeding.  Physical Exam:  General: alert, cooperative, and no distress Lochia: appropriate Uterine Fundus: firm Incision:  DVT Evaluation: No evidence of DVT seen on physical exam.  Recent Labs    08/10/23 2242 08/11/23 0554  HGB 11.3* 9.2*  HCT 31.2* 26.2*    Assessment/Plan: Plan for discharge tomorrow   LOS: 2 days   Lazaro Arms, MD 08/11/2023, 4:08 PM

## 2023-08-11 NOTE — TOC Benefit Eligibility Note (Signed)
Pharmacy Patient Advocate Encounter  Insurance verification completed.    The patient is insured through  [facility]    Ran test claim for Long-Acting Insulin.  Levemir Vial 10mL per 30 days $0.00 Lantus Vial 10mL per 30 days $0.00  Ran test claim for Rapid-Acting Insulin.  Novolog Flexpen 15mL per 30 days $0.00 Novolog Vial 10mL per 30 days $0.00 Humalog Flexpen (brand and generic) 15mL per 30 days $0.00 Humalog Vial (brand and generic) 10mL per 30 days $0.00  This test claim was processed through [facility]- copay amounts may vary at other pharmacies due to pharmacy/plan contracts, or as the patient moves through the different stages of their insurance plan.

## 2023-08-11 NOTE — Inpatient Diabetes Management (Addendum)
Inpatient Diabetes Program Recommendations  AACE/ADA: New Consensus Statement on Inpatient Glycemic Control (2015)  Target Ranges:  Prepandial:   less than 140 mg/dL      Peak postprandial:   less than 180 mg/dL (1-2 hours)      Critically ill patients:  140 - 180 mg/dL   Lab Results  Component Value Date   GLUCAP 340 (H) 08/11/2023   HGBA1C 9.1 (H) 08/11/2023     Latest Reference Range & Units 08/10/23 15:21 08/11/23 02:00 08/11/23 06:40 08/11/23 11:36  Glucose-Capillary 70 - 99 mg/dL 161 (H) 096 (H) 045 (H) 340 (H)  (H): Data is abnormally high  Diabetes history: New DM Outpatient Diabetes medications: None Current orders for Inpatient glycemic control: NPH 20 units QAM and 10 units at bedtime, Novolog 0-15 TID and Novolog 10 units TID  Inpatient Diabetes Program Recommendations:    Might consider switching to Semglee 30 units QAM.  Asked pharmacy for a benefit check on insulins for discharge.  Lantus and Novolog are both covered at a $0 co-pay.    Met with patient at bedside.  She is very sleepy.  Explained Lantus (long acting) and Novolog (rapid acting) are both covered at 100%.  Explained we are unsure exactly what her doses will be at discharge as she received a steroid injection yesterday and will have to see how her glucose trends are once that is out of her system.  She verbalizes understanding.    Will continue to follow while inpatient.  Thank you, Dulce Sellar, MSN, CDCES Diabetes Coordinator Inpatient Diabetes Program [phone] (team pager from 8a-5p)  Discharge Recommendations: Long acting recommendations: Insulin Glargine (LANTUS) Solostar Pen to be determined  Short acting recommendations:  Meal + Correction coverage Insulin aspart (NOVOLOG) FlexPen  Moderate Scale.  to be determined Supply/Referral recommendations: Glucometer Test strips Lancet device Lancets Pen needles - standard   Use Adult Diabetes Insulin Treatment Post Discharge order  set.  Will continue to follow while inpatient.  Thank you, Dulce Sellar, MSN, CDCES Diabetes Coordinator Inpatient Diabetes Program [phone] (team pager from 8a-5p)

## 2023-08-11 NOTE — Discharge Summary (Signed)
Postpartum Discharge Summary  Date of Service updated***     Patient Name: Knowledge Leaders DOB: [birth year] MRN: [id number]  Date of admission: 08/09/2023 Delivery date:08/10/2023 Delivering provider: CHUBB, CASEY C Date of discharge: 08/11/2023  Admitting diagnosis: DKA (diabetic ketoacidosis) (HCC) [E11.10] Preterm labor [O60.00] Intrauterine pregnancy: [redacted]w[redacted]d     Secondary diagnosis:  Principal Problem:   DKA (diabetic ketoacidosis) (HCC) Active Problems:   Preterm labor  Additional problems: DKA, PPROM, PTD     Discharge diagnosis: Preterm Pregnancy Delivered, GDM A2, and PTD, PPROM                                               Post partum procedures:{Postpartum procedures:23558} Augmentation:  Received roughly 1.5hrs of GBS PCN ppx, about 2hrs Neuroprotective Mag, Only one dose of betamethasone at 2159 so only about 2hrs of dwell time prior to delivery  Complications: None  Hospital course: Originally admitted for DKA at 26 weeks 2 days.  On 11/26 (Day 1 of hospitalization) pt found to be in PTL with a completely dilated cervix and bulging bag.  Decision made to move to L&D and start Neuro-protective Mag, GBS ppx, and BMX protocol.  Onset of Labor With Vaginal Delivery      32 y.o. yo [phone] at [redacted]w[redacted]d was admitted in Active Labor on 08/09/2023. Labor course was complicated by PPROM following epidural with precipitous delivery of infant immediately after.  Infant required intubation and transition to NICU.  Mom required lower uterine sweep, rectal cytotec and pitocin with fundal rubs for PP bleeding.  No PPH immediately following delivery of placenta.  Membrane Rupture Time/Date: 11:54 PM,08/10/2023  Delivery Method:Vaginal, Spontaneous Operative Delivery:N/A Episiotomy: None Lacerations:  None Patient had a postpartum course complicated by ***.  She is ambulating, tolerating a regular diet, passing flatus, and urinating well. Patient is discharged home in stable condition on  08/11/23.  Newborn Data: Birth date:08/10/2023 Birth time:11:56 PM Gender:Female Living status:Living Apgars: ,  Weight:1280 g  Magnesium Sulfate received: Yes: Neuroprotection ~2hrs prior to delivery BMZ received: Yes, only first dose and dwell only ~2hrs prior to delivery  Rhophylac:N/A MMR:N/A T-DaP:{Tdap:23962}ordered *** Flu: {OZD:66440} RSV Vaccine received: {RSV:31013} Transfusion:{Transfusion received:30440034}  Immunizations received: There is no immunization history for the selected administration types on file for this patient.  Physical exam  Vitals:   08/10/23 2349 08/11/23 0000 08/11/23 0015 08/11/23 0034  BP: 119/77 118/88 (!) 133/113 (!) 118/57  Pulse: (!) 128 (!) 118 (!) 119 (!) 131  Resp:      Temp:      TempSrc:      SpO2:      Weight:      Height:       General: {Exam; general:21111117} Lochia: {Desc; appropriate/inappropriate:30686::"appropriate"} Uterine Fundus: {Desc; firm/soft:30687} Incision: {Exam; incision:21111123} DVT Evaluation: {Exam; dvt:2111122} Labs: Lab Results  Component Value Date   WBC 10.7 (H) 08/10/2023   HGB 11.3 (L) 08/10/2023   HCT 31.2 (L) 08/10/2023   MCV 72.4 (L) 08/10/2023   PLT 184 08/10/2023      Latest Ref Rng & Units 08/10/2023    7:50 AM  CMP  Glucose 70 - 99 mg/dL 347   BUN 6 - 20 mg/dL 11   Creatinine 4.25 - 1.00 mg/dL 9.56   Sodium 387 - 564 mmol/L 137   Potassium 3.5 - 5.1 mmol/L 3.5   Chloride  98 - 111 mmol/L 111   CO2 22 - 32 mmol/L 18   Calcium 8.9 - 10.3 mg/dL 9.1    Edinburgh Score:    08/26/2017   12:00 PM  Edinburgh Postnatal Depression Scale Screening Tool  I have been able to laugh and see the funny side of things. 0  I have looked forward with enjoyment to things. 0  I have blamed myself unnecessarily when things went wrong. 0  I have been anxious or worried for no good reason. 0  I have felt scared or panicky for no good reason. 0  Things have been getting on top of me. 0  I have  been so unhappy that I have had difficulty sleeping. 0  I have felt sad or miserable. 1  I have been so unhappy that I have been crying. 0  The thought of harming myself has occurred to me. 0  Edinburgh Postnatal Depression Scale Total 1   No data recorded  After visit meds:  Allergies as of 08/11/2023       Reactions   Latex Other (See Comments)   Gloves irritate eczema on hands, no problems elsewhere per patient     Med Rec must be completed prior to using this [facility]***        Discharge home in stable condition Infant Feeding: {Baby feeding:23562} Infant Disposition:{CHL IP OB HOME WITH NWGNFA:21308} Discharge instruction: per After Visit Summary and Postpartum booklet. Activity: Advance as tolerated. Pelvic rest for 6 weeks.  Diet: {OB MVHQ:46962952} Future Appointments: Future Appointments  Date Time Provider Department Center  08/16/2023  9:00 AM CENTERING PROVIDER [facility]  08/18/2023  8:20 AM [facility] LAB [facility]  09/13/2023  9:00 AM CENTERING PROVIDER [facility]  09/24/2023  9:15 AM [facility] NURSE [facility]  09/24/2023  9:30 AM [facility] US5 [facility]  09/27/2023  9:00 AM CENTERING PROVIDER [facility]  10/11/2023  9:00 AM CENTERING PROVIDER [facility]  10/25/2023  9:00 AM CENTERING PROVIDER [facility]  11/08/2023  9:00 AM CENTERING PROVIDER [facility]  11/22/2023  9:00 AM CENTERING PROVIDER [facility]  12/06/2023  9:00 AM CENTERING PROVIDER [facility]  12/20/2023  9:00 AM CENTERING PROVIDER [facility]   Follow up Visit: Message sent to [facility] 11/27  Please schedule this patient for a In person postpartum visit in 4 weeks with the following provider: Any provider. Additional Postpartum F/U:Postpartum Depression checkup and 2 hour GTT  Low risk pregnancy complicated by: GDM and PTL PPROM and PTD at 26 weeks Delivery mode:  Vaginal, Spontaneous Anticipated Birth Control:  Unsure   08/11/2023 Hessie Dibble, MD

## 2023-08-11 NOTE — Progress Notes (Signed)
This RN was not aware of pt receiving her breakfast tray around 0930, so pt did not receive her novolog before her meal. Her 2 hr PP CBG was 340. Notified Dr. Despina Hidden. Received verbal order to give 15 units of novolog now.

## 2023-08-11 NOTE — Clinical Social Work Maternal (Signed)
CLINICAL SOCIAL WORK MATERNAL/CHILD NOTE  Patient Details  Name: Linda Barry MRN: [id number] Date of Birth: [birth year]  Date:  08/11/2023  Clinical Social Worker Initiating Note:  [place]ivi [place], [place] Date/Time: Initiated:  08/11/23/1146     Child's Name:  Linda Barry   Biological Parents:  Mother, Father (MOB: Katera Achey 10-05-92 FOB: Valeta Harms 08/20/1992)   Need for Interpreter:  None   Reason for Referral:  Other (Comment) (NICU admission)   Address:  [address] [place] [address]    Phone number:  [phone] (home)     Additional phone number:   Household Members/Support Persons (HM/SP):   Household Member/Support Person 1, Household Member/Support Person 2, Household Member/Support Person 3   HM/SP Name Relationship DOB or Age  HM/SP -1 Dellia Nims Son 03/22/2009  HM/SP -2 Promise Mat Carne Daughter 04/15/2012  HM/SP -3 Nolon Price Son 08/26/2017  HM/SP -4        HM/SP -5        HM/SP -6        HM/SP -7        HM/SP -8          Natural Supports (not living in the home):  Spouse/significant other, Immediate Family   Professional Supports: None   Employment: Full-time   Type of Work: [employer]   Education:  Some Automotive engineer   Homebound arranged:    Surveyor, quantity Resources:  [employer]   Other Resources:  Sales executive  , [employer]   Cultural/Religious Considerations Which May Impact Care:  Catholic  Strengths:  Ability to meet basic needs  , Home prepared for child     Psychotropic Medications:         Pediatrician:       Pediatrician List:   Radiographer, therapeutic    [place]    [facility]      Pediatrician Fax Number:    Risk Factors/Current Problems:  None   Cognitive State:  Able to Concentrate  , Goal Oriented  , Alert  , Insightful     Mood/Affect:  Comfortable  , Calm  , Interested     CSW Assessment: CSW received consult for NICU admission. CSW met with MOB to offer  support and complete assessment.   CSW met with MOB on 11/26 for SDOH concerns (see progress note).   CSW met with MOB at bedside. CSW observed MOB lying in bed and FOB [facility]) was present at bedside. CSW explained the reason for this visit. FOB left the room to allow MOB privacy. CSW inquired how has been doing since giving birth. MOB reported, "I am emotional and still in shock." CSW validated MOB emotions. MOB recounted and shared the evening events leading up to the infant's "surprise birth." MOB reported that she had been to the NICU to see the infant and planned to return later today for an update. CSW let MOB know that she could call for updates as well. CSW offered check in with MOB weekly to offer support and resources while the infant remains in NICU. MOB chose to reach out to CSW if needs arise. CSW inquired about MOB supports. MOB identified her partner and sister as supports.   CSW inquired if MOB had mental health history. MOB denied mental health history. CSW asked about the depression diagnosis noted in the chart. MOB reported "it is probably from when I had my babies." MOB  denied current concerns. CSW inquired if it was postpartum depression. MOB denied postpartum depression history. CSW provided education regarding the baby blues period vs. perinatal mood disorders, discussed treatment and gave resources for mental health follow up if concerns arise.  CSW recommended MOB complete a self-evaluation during the postpartum time period using the New Mom Checklist from Postpartum Progress and encouraged MOB to contact a medical professional if symptoms are noted at any time. CSW assessed MOB for safety. MOB denied SI/HI and DV.   MOB reported that she had reached out to her insurance for a breast pump, but the insurance company stated they could not provide a breast pump until January 2025. CSW inquired if MOB received WIC/FS. MOB reported that she receives [facility] and FS benefits. CSW  encouraged MOB to reach out to [facility] to them know that she needed a breast pump. MOB stated that she would call today. MOB reported she will use the hospital breast pump until she is able to get a breast pump.    CSW inquired if MOB would be able to get essential items for the infant. MOB reported, "I am working on it." CSW discussed Family Support Network and MOB gave CSW permission to make a referral to [employer]. CSW provided MOB with information for the [place] [employer] and encouraged MOB to follow up to determine if she would qualify for assistance. MOB thanked CSW for the visit.   CSW provided review of Sudden Infant Death Syndrome (SIDS) precautions. MOB reported understanding.   CSW identifies no further need for intervention and no barriers to discharge at this time.     CSW Plan/Description:  Perinatal Mood and Anxiety Disorder (PMADs) Education, Other Information/Referral to [employer], Sudden Infant Death Syndrome (SIDS) Education    Clearance Coots, LCSW 08/11/2023, 12:44 PM

## 2023-08-11 NOTE — Anesthesia Procedure Notes (Signed)
Epidural Patient location during procedure: OB Start time: 08/10/2023 11:40 PM End time: 08/10/2023 11:50 PM  Staffing Anesthesiologist: Elmer Picker, MD Performed: anesthesiologist   Preanesthetic Checklist Completed: patient identified, IV checked, risks and benefits discussed, monitors and equipment checked, pre-op evaluation and timeout performed  Epidural Patient position: sitting Prep: DuraPrep and site prepped and draped Patient monitoring: continuous pulse ox, blood pressure, heart rate and cardiac monitor Approach: midline Location: L3-L4 Injection technique: LOR air  Needle:  Needle type: Tuohy  Needle gauge: 17 G Needle length: 9 cm Needle insertion depth: 7 cm Catheter type: closed end flexible Catheter size: 19 Gauge Catheter at skin depth: 13 cm Test dose: negative  Assessment Sensory level: T8 Events: blood not aspirated, no cerebrospinal fluid, injection not painful, no injection resistance, no paresthesia and negative IV test  Additional Notes Patient identified. Risks/Benefits/Options discussed with patient including but not limited to bleeding, infection, nerve damage, paralysis, failed block, incomplete pain control, headache, blood pressure changes, nausea, vomiting, reactions to medication both or allergic, itching and postpartum back pain. Confirmed with bedside nurse the patient's most recent platelet count. Confirmed with patient that they are not currently taking any anticoagulation, have any bleeding history or any family history of bleeding disorders. Patient expressed understanding and wished to proceed. All questions were answered. Sterile technique was used throughout the entire procedure. Please see nursing notes for vital signs. Test dose was given through epidural catheter and negative prior to continuing to dose epidural or start infusion. Warning signs of high block given to the patient including shortness of breath, tingling/numbness in  hands, complete motor block, or any concerning symptoms with instructions to call for help. Patient was given instructions on fall risk and not to get out of bed. All questions and concerns addressed with instructions to call with any issues or inadequate analgesia.  Reason for block:procedure for pain

## 2023-08-12 LAB — GLUCOSE, CAPILLARY
Glucose-Capillary: 135 mg/dL — ABNORMAL HIGH (ref 70–99)
Glucose-Capillary: 199 mg/dL — ABNORMAL HIGH (ref 70–99)
Glucose-Capillary: 270 mg/dL — ABNORMAL HIGH (ref 70–99)

## 2023-08-12 MED ORDER — IBUPROFEN 600 MG PO TABS: 600.0000 mg | ORAL_TABLET | Freq: Four times a day (QID) | ORAL | 0 refills | Status: AC

## 2023-08-12 MED ORDER — INSULIN GLARGINE-YFGN 100 UNIT/ML ~~LOC~~ SOLN: 20.0000 [IU] | Freq: Once | SUBCUTANEOUS | Status: DC

## 2023-08-12 MED ORDER — INSULIN NPH (HUMAN) (ISOPHANE) 100 UNIT/ML ~~LOC~~ SUSP: 12.0000 [IU] | Freq: Every day | SUBCUTANEOUS | 11 refills | Status: DC

## 2023-08-12 MED ORDER — LANCETS MISC: 1.0000 | Freq: Three times a day (TID) | 0 refills | Status: DC

## 2023-08-12 MED ORDER — LANCET DEVICE MISC: 1.0000 | Freq: Three times a day (TID) | 0 refills | Status: DC

## 2023-08-12 MED ORDER — INSULIN NPH (HUMAN) (ISOPHANE) 100 UNIT/ML ~~LOC~~ SUSP
12.0000 [IU] | Freq: Every day | SUBCUTANEOUS | Status: DC
  Filled 2023-08-12: qty 10

## 2023-08-12 MED ORDER — INSULIN NPH (HUMAN) (ISOPHANE) 100 UNIT/ML ~~LOC~~ SUSP: 22.0000 [IU] | Freq: Every day | SUBCUTANEOUS | 11 refills | Status: DC

## 2023-08-12 MED ORDER — METFORMIN HCL 500 MG PO TABS: 500.0000 mg | ORAL_TABLET | Freq: Two times a day (BID) | ORAL | 1 refills | Status: DC

## 2023-08-12 MED ORDER — INSULIN ASPART 100 UNIT/ML IJ SOLN: 10.0000 [IU] | Freq: Two times a day (BID) | INTRAMUSCULAR | 11 refills | Status: DC

## 2023-08-12 MED ORDER — INSULIN NPH (HUMAN) (ISOPHANE) 100 UNIT/ML ~~LOC~~ SUSP
22.0000 [IU] | Freq: Every day | SUBCUTANEOUS | Status: DC
  Administered 2023-08-12: 22 [IU] via SUBCUTANEOUS

## 2023-08-12 MED ORDER — BLOOD GLUCOSE MONITORING SUPPL DEVI: 1.0000 | Freq: Three times a day (TID) | 0 refills | Status: AC

## 2023-08-12 MED ORDER — BLOOD GLUCOSE TEST VI STRP: 1.0000 | ORAL_STRIP | Freq: Three times a day (TID) | 0 refills | Status: DC

## 2023-08-12 NOTE — Inpatient Diabetes Management (Signed)
Inpatient Diabetes Program Recommendations  AACE/ADA: New Consensus Statement on Inpatient Glycemic Control (2015)  Target Ranges:  Prepandial:   less than 140 mg/dL      Peak postprandial:   less than 180 mg/dL (1-2 hours)      Critically ill patients:  140 - 180 mg/dL   Lab Results  Component Value Date   GLUCAP 270 (H) 08/12/2023   HGBA1C 9.1 (H) 08/11/2023    Review of Glycemic Control  Diabetes history: New DM Outpatient Diabetes medications: None Current orders for Inpatient glycemic control: NPH 20 units QAM and 10 units at bedtime, Novolog 0-15 TID and Novolog 10 units TID   Inpatient Diabetes Program Recommendations:     Consider switching to Semglee 40 units QAM and adding BMET as last reading had CO2 of 13. Anticipate need for further titration. Secure chat sent to MD & RN.   Asked pharmacy for a benefit check on insulins for discharge.  Lantus and Novolog are both covered at a $0 co-pay.   Discharge Recommendations: Long acting recommendations: Insulin Glargine (LANTUS) Solostar Pen to be determined  Short acting recommendations:  Meal + Correction coverage Insulin aspart (NOVOLOG) FlexPen  Moderate Scale.  to be determined Supply/Referral recommendations: Glucometer Test strips Lancet device Lancets Pen needles - standard   Use Adult Diabetes Insulin Treatment Post Discharge order set.  Thanks, Lujean Rave, MSN, RNC-OB Diabetes Coordinator [phone] (8a-5p)

## 2023-08-12 NOTE — Anesthesia Postprocedure Evaluation (Signed)
Anesthesia Post Note  Patient: Linda Barry  Procedure(s) Performed: AN AD HOC LABOR EPIDURAL     Patient location during evaluation: Mother Baby Anesthesia Type: Epidural Level of consciousness: awake Pain management: satisfactory to patient Vital Signs Assessment: post-procedure vital signs reviewed and stable Respiratory status: spontaneous breathing Cardiovascular status: stable Anesthetic complications: no  No notable events documented.  Last Vitals:  Vitals:   08/12/23 0430 08/12/23 0738  BP: (!) 92/48 (!) 102/52  Pulse: 81 85  Resp: 18 18  Temp: 37 C 37.1 C  SpO2: 100% 99%    Last Pain:  Vitals:   08/12/23 0800  TempSrc:   PainSc: 0-No pain   Pain Goal:                   [employer]

## 2023-08-12 NOTE — Lactation Note (Signed)
This note was copied from a baby'Linda Barry chart.  NICU Lactation Consultation Note  Patient Name: Linda Barry [id number] Date: 08/12/2023 Age:30 hours  Reason for consult: Follow-up assessment; NICU baby; Infant < 6lbs; Maternal endocrine disorder; Maternal discharge; Preterm <34wks Type of Endocrine Disorder?: Diabetes (T2DM (insulin))  SUBJECTIVE Visited with family of 45 hours old pre-term NICU female; Linda Barry is a P4 but doesn't have a lot of experience breastfeeding. She reported she started pumping yesterday but "nothing came out", praised her for her efforts. Re-educated that the purpose of pumping this early on is mainly for breast stimulation and not to get volume. She'll be getting discharged today. Reviewed discharge education, pumping schedule, pump settings, anticipatory guidelines and the importance of consistent pumping for the onset of lactogenesis II and the prevention of engorgement.   OBJECTIVE Infant data: Mother'Linda Barry Current Feeding Choice: -- (NPO)  O2 Device: Jet Vent FiO2 (%): 35 %  Maternal data: [id number] Vaginal, Spontaneous Has patient been taught Hand Expression?: Yes Hand Expression Comments: no colostrum noted yet Significant Breast History:: moderate breast changes during the pregnancy Current breast feeding challenges:: NICU admission Does the patient have breastfeeding experience prior to this delivery?: Yes How long did the patient breastfeed?: only baby # 3 for 4 months Pumping frequency: 2 times/24 hours Pumped volume: 0 mL Flange Size: 21 Risk factor for low/delayed milk supply:: prematurity, infant separation, PPH of 621 cc, T2DM  WIC Program: Yes WIC Referral Sent?: Yes What county?: [place] Pump:  (Offered [facility] loaner pump prior discharge on 08/12/2023)  ASSESSMENT Infant: Feeding Status: NPO  Maternal: Milk volume: Normal  INTERVENTIONS/PLAN Interventions: Interventions: Breast feeding basics reviewed; DEBP; Education; Coconut  oil Discharge Education: Engorgement and breast care Tools: Flanges; Pump; Coconut oil Pump Education: Setup, frequency, and cleaning; Milk Storage  Plan: Encouraged pumping every 3 hours, ideally 8 pumping sessions/24 hours She'll take all pump parts to baby'Linda Barry room after her discharge She'll switch her pump settings from initiate to maintain once she starts getting 20 ml of EBM combined She'll call out of lactation in case she decides on doing the [facility] loaner pump prior her discharge  No other support person at this time. All questions and concerns answered, family to contact [facility] services PRN.  Consult Status: NICU follow-up NICU Follow-up type: Verify absence of engorgement; Verify onset of copious milk   Linda Barry Linda Barry Linda Barry 08/12/2023, 1:47 PM

## 2023-08-12 NOTE — Progress Notes (Signed)
Pt discharged home in stable condition after discharge instructions given. Pt verbalized understanding and all questions were answered. IV was discontinued and pt was sent home with all belongings.

## 2023-08-13 ENCOUNTER — Telehealth: Payer: Medicaid Other | Admitting: Family Medicine

## 2023-08-13 DIAGNOSIS — E119 Type 2 diabetes mellitus without complications: Secondary | ICD-10-CM | POA: Diagnosis not present

## 2023-08-13 LAB — SURGICAL PATHOLOGY

## 2023-08-13 MED ORDER — INSULIN PEN NEEDLE 31G X 5 MM MISC: 0 refills | Status: DC

## 2023-08-13 MED ORDER — INSULIN SYRINGE 31G X 5/16" 1 ML MISC: 0 refills | Status: DC

## 2023-08-13 NOTE — Progress Notes (Signed)
Virtual Visit Consent   Linda Barry, you are scheduled for a virtual visit with a [facility] provider today. Just as with appointments in the office, your consent must be obtained to participate. Your consent will be active for this visit and any virtual visit you may have with one of our providers in the next 365 days. If you have a MyChart account, a copy of this consent can be sent to you electronically.  As this is a virtual visit, video technology does not allow for your provider to perform a traditional examination. This may limit your provider's ability to fully assess your condition. If your provider identifies any concerns that need to be evaluated in person or the need to arrange testing (such as labs, EKG, etc.), we will make arrangements to do so. Although advances in technology are sophisticated, we cannot ensure that it will always work on either your end or our end. If the connection with a video visit is poor, the visit may have to be switched to a telephone visit. With either a video or telephone visit, we are not always able to ensure that we have a secure connection.  By engaging in this virtual visit, you consent to the provision of healthcare and authorize for your insurance to be billed (if applicable) for the services provided during this visit. Depending on your insurance coverage, you may receive a charge related to this service.  I need to obtain your verbal consent now. Are you willing to proceed with your visit today? Linda Barry has provided verbal consent on 08/13/2023 for a virtual visit (video or telephone). Linda Barry, [place]  Date: 08/13/2023 10:11 AM  Virtual Visit via Video Note   I, Linda Barry, connected with  Linda Barry  ([id number], Oct 08, 1990) on 08/13/23 at 10:00 AM EST by a video-enabled telemedicine application and verified that I am speaking with the correct person using two identifiers.  Location: Patient: Virtual Visit Location Patient:  Home Provider: Virtual Visit Location Provider: Home Office   I discussed the limitations of evaluation and management by telemedicine and the availability of in person appointments. The patient expressed understanding and agreed to proceed.    History of Present Illness: Linda Barry is a 32 y.o. who identifies as a female who was assigned female at birth, and is being seen today for c/o she was discharged from the hospital yesterday and prescribed her insulin but did not receive insulin syringes. Pt is requesting refill in both insulin syringes and needles.   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   History of placenta abruption 05/28/2023   Supervision of other normal pregnancy, antepartum 05/20/2023    Allergies:  Allergies  Allergen Reactions   Latex Other (See Comments)    Gloves irritate eczema on hands, no problems elsewhere per patient   Medications:  Current Outpatient Medications:    Insulin Pen Needle 31G X 5 MM MISC, Please dispense insulin syringes to draw insulin out of vial, Disp: 14 each, Rfl: 0   Insulin Syringe-Needle U-100 (INSULIN SYRINGE 1CC/31GX5/16") 31G X 5/16" 1 ML MISC, Please dispense insulin needles to go with insulin syringes, Disp: 14 each, Rfl: 0   Blood Glucose Monitoring Suppl DEVI, 1 each by Does not apply route 3 (three)  times daily. May dispense any manufacturer covered by patient's insurance., Disp: 1 each, Rfl: 0   Glucose Blood (BLOOD GLUCOSE TEST STRIPS) STRP, 1 each by Does not apply route 3 (three) times daily. Use as directed to check blood sugar. May dispense any manufacturer covered by patient's insurance and fits patient's device., Disp: 100 strip, Rfl: 0   ibuprofen (ADVIL) 600 MG  tablet, Take 1 tablet (600 mg total) by mouth every 6 (six) hours., Disp: 30 tablet, Rfl: 0   insulin aspart (NOVOLOG) 100 UNIT/ML injection, Inject 10 Units into the skin 2 (two) times daily before a meal., Disp: 10 mL, Rfl: 11   insulin NPH Human (NOVOLIN N) 100 UNIT/ML injection, Inject 0.12 mLs (12 Units total) into the skin daily before supper., Disp: 10 mL, Rfl: 11   insulin NPH Human (NOVOLIN N) 100 UNIT/ML injection, Inject 0.22 mLs (22 Units total) into the skin daily before breakfast., Disp: 10 mL, Rfl: 11   Lancet Device MISC, 1 each by Does not apply route 3 (three) times daily. May dispense any manufacturer covered by patient's insurance., Disp: 1 each, Rfl: 0   Lancets MISC, 1 each by Does not apply route 3 (three) times daily. Use as directed to check blood sugar. May dispense any manufacturer covered by patient's insurance and fits patient's device., Disp: 100 each, Rfl: 0   metFORMIN (GLUCOPHAGE) 500 MG tablet, Take 1 tablet (500 mg total) by mouth 2 (two) times daily at 8 am and 10 pm., Disp: 60 tablet, Rfl: 1   Prenatal MV & Min w/FA-DHA (PRENATAL GUMMIES) 0.18-25 MG CHEW, Chew by mouth., Disp: , Rfl:   Observations/Objective: Patient is well-developed, well-nourished in no acute distress.  Resting comfortably at home.  Head is normocephalic, atraumatic.  No labored breathing.  Speech is clear and coherent with logical content.  Patient is alert and oriented at baseline.    Assessment and Plan: 1. Type 2 diabetes mellitus without complication, unspecified whether long term insulin use (HCC) - Insulin Syringe-Needle U-100 (INSULIN SYRINGE 1CC/31GX5/16") 31G X 5/16" 1 ML MISC; Please dispense insulin needles to go with insulin syringes  Dispense: 14 each; Refill: 0 - Insulin Pen Needle 31G X 5 MM MISC; Please dispense insulin syringes to draw insulin out of vial  Dispense: 14 each; Refill: 0  -Pt to follow up with OBGYN or PCP for further refills.   Follow Up  Instructions: I discussed the assessment and treatment plan with the patient. The patient was provided an opportunity to ask questions and all were answered. The patient agreed with the plan and demonstrated an understanding of the instructions.  A copy of instructions were sent to the patient via MyChart unless otherwise noted below.     The patient was advised to call back or seek an in-person evaluation if the symptoms worsen or if the condition fails to improve as anticipated.    Linda Pandy, PA-C

## 2023-08-13 NOTE — Patient Instructions (Signed)
Linda Barry, thank you for joining [name], PA-C for today's virtual visit.  While this provider is not your primary care provider (PCP), if your PCP is located in our provider database this encounter information will be shared with them immediately following your visit.   A [facility] MyChart account gives you access to today's visit and all your visits, tests, and labs performed at [facility] " click here if you don't have a [facility] MyChart account or go to [email or web address]  Consent: (Patient) Linda Barry provided verbal consent for this virtual visit at the beginning of the encounter.  Current Medications:  Current Outpatient Medications:    Insulin Pen Needle 31G X 5 MM MISC, Please dispense insulin syringes to draw insulin out of vial, Disp: 14 each, Rfl: 0   Insulin Syringe-Needle U-100 (INSULIN SYRINGE 1CC/31GX5/16") 31G X 5/16" 1 ML MISC, Please dispense insulin needles to go with insulin syringes, Disp: 14 each, Rfl: 0   Blood Glucose Monitoring Suppl DEVI, 1 each by Does not apply route 3 (three) times daily. May dispense any manufacturer covered by patient's insurance., Disp: 1 each, Rfl: 0   Glucose Blood (BLOOD GLUCOSE TEST STRIPS) STRP, 1 each by Does not apply route 3 (three) times daily. Use as directed to check blood sugar. May dispense any manufacturer covered by patient's insurance and fits patient's device., Disp: 100 strip, Rfl: 0   ibuprofen (ADVIL) 600 MG tablet, Take 1 tablet (600 mg total) by mouth every 6 (six) hours., Disp: 30 tablet, Rfl: 0   insulin aspart (NOVOLOG) 100 UNIT/ML injection, Inject 10 Units into the skin 2 (two) times daily before a meal., Disp: 10 mL, Rfl: 11   insulin NPH Human (NOVOLIN N) 100 UNIT/ML injection, Inject 0.12 mLs (12 Units total) into the skin daily before supper., Disp: 10 mL, Rfl: 11   insulin NPH Human (NOVOLIN N) 100 UNIT/ML injection, Inject 0.22 mLs (22 Units total) into the skin daily before  breakfast., Disp: 10 mL, Rfl: 11   Lancet Device MISC, 1 each by Does not apply route 3 (three) times daily. May dispense any manufacturer covered by patient's insurance., Disp: 1 each, Rfl: 0   Lancets MISC, 1 each by Does not apply route 3 (three) times daily. Use as directed to check blood sugar. May dispense any manufacturer covered by patient's insurance and fits patient's device., Disp: 100 each, Rfl: 0   metFORMIN (GLUCOPHAGE) 500 MG tablet, Take 1 tablet (500 mg total) by mouth 2 (two) times daily at 8 am and 10 pm., Disp: 60 tablet, Rfl: 1   Prenatal MV & Min w/FA-DHA (PRENATAL GUMMIES) 0.18-25 MG CHEW, Chew by mouth., Disp: , Rfl:    Medications ordered in this encounter:  Meds ordered this encounter  Medications   Insulin Syringe-Needle U-100 (INSULIN SYRINGE 1CC/31GX5/16") 31G X 5/16" 1 ML MISC    Sig: Please dispense insulin needles to go with insulin syringes    Dispense:  14 each    Refill:  0   Insulin Pen Needle 31G X 5 MM MISC    Sig: Please dispense insulin syringes to draw insulin out of vial    Dispense:  14 each    Refill:  0     *If you need refills on other medications prior to your next appointment, please contact your pharmacy*  Follow-Up: Call back or seek an in-person evaluation if the symptoms worsen or if the condition fails to improve as anticipated.  [facility] [phone])  [phone]  Other Instructions Diabetes Mellitus Basics  Diabetes mellitus, or diabetes, is a long-term (chronic) disease. It occurs when the body does not properly use sugar (glucose) that is released from food after you eat. Diabetes mellitus may be caused by one or both of these problems: Your pancreas does not make enough of a hormone called insulin. Your body does not react in a normal way to the insulin that it makes. Insulin lets glucose enter cells in your body. This gives you energy. If you have diabetes, glucose cannot get into cells. This causes high blood glucose  (hyperglycemia). How to treat and manage diabetes You may need to take insulin or other diabetes medicines daily to keep your glucose in balance. If you are prescribed insulin, you will learn how to give yourself insulin by injection. You may need to adjust the amount of insulin you take based on the foods that you eat. You will need to check your blood glucose levels using a glucose monitor as told by your health care provider. The readings can help determine if you have low or high blood glucose. Generally, you should have these blood glucose levels: Before meals (preprandial): 80-130 mg/dL (1.1-9.1 mmol/L). After meals (postprandial): below 180 mg/dL (10 mmol/L). Hemoglobin A1c (HbA1c) level: less than 7%. Your health care provider will set treatment goals for you. Keep all follow-up visits. This is important. Follow these instructions at home: Diabetes medicines Take your diabetes medicines every day as told by your health care provider. List your diabetes medicines here: Name of medicine: ______________________________ Amount (dose): _______________ Time (a.m./p.m.): _______________ Notes: ___________________________________ Name of medicine: ______________________________ Amount (dose): _______________ Time (a.m./p.m.): _______________ Notes: ___________________________________ Name of medicine: ______________________________ Amount (dose): _______________ Time (a.m./p.m.): _______________ Notes: ___________________________________ Insulin If you use insulin, list the types of insulin you use here: Insulin type: ______________________________ Amount (dose): _______________ Time (a.m./p.m.): _______________Notes: ___________________________________ Insulin type: ______________________________ Amount (dose): _______________ Time (a.m./p.m.): _______________ Notes: ___________________________________ Insulin type: ______________________________ Amount (dose): _______________ Time  (a.m./p.m.): _______________ Notes: ___________________________________ Insulin type: ______________________________ Amount (dose): _______________ Time (a.m./p.m.): _______________ Notes: ___________________________________ Insulin type: ______________________________ Amount (dose): _______________ Time (a.m./p.m.): _______________ Notes: ___________________________________ Managing blood glucose  Check your blood glucose levels using a glucose monitor as told by your health care provider. Write down the times that you check your glucose levels here: Time: _______________ Notes: ___________________________________ Time: _______________ Notes: ___________________________________ Time: _______________ Notes: ___________________________________ Time: _______________ Notes: ___________________________________ Time: _______________ Notes: ___________________________________ Time: _______________ Notes: ___________________________________  Low blood glucose Low blood glucose (hypoglycemia) is when glucose is at or below 70 mg/dL (3.9 mmol/L). Symptoms may include: Feeling: Hungry. Sweaty and clammy. Irritable or easily upset. Dizzy. Sleepy. Having: A fast heartbeat. A headache. A change in your vision. Numbness around the mouth, lips, or tongue. Having trouble with: Moving (coordination). Sleeping. Treating low blood glucose To treat low blood glucose, eat or drink something containing sugar right away. If you can think clearly and swallow safely, follow the 15:15 rule: Take 15 grams of a fast-acting carb (carbohydrate), as told by your health care provider. Some fast-acting carbs are: Glucose tablets: take 3-4 tablets. Hard candy: eat 3-5 pieces. Fruit juice: drink 4 oz (120 mL). Regular (not diet) soda: drink 4-6 oz (120-180 mL). Honey or sugar: eat 1 Tbsp (15 mL). Check your blood glucose levels 15 minutes after you take the carb. If your glucose is still at or below 70 mg/dL  (3.9 mmol/L), take 15 grams of a carb again. If your glucose does not go above  70 mg/dL (3.9 mmol/L) after 3 tries, get help right away. After your glucose goes back to normal, eat a meal or a snack within 1 hour. Treating very low blood glucose If your glucose is at or below 54 mg/dL (3 mmol/L), you have very low blood glucose (severe hypoglycemia). This is an emergency. Do not wait to see if the symptoms will go away. Get medical help right away. Call your local emergency services (911 in the [place] Do not drive yourself to the hospital. Questions to ask your health care provider Should I talk with a diabetes educator? What equipment will I need to care for myself at home? What diabetes medicines do I need? When should I take them? How often do I need to check my blood glucose levels? What number can I call if I have questions? When is my follow-up visit? Where can I find a support group for people with diabetes? Where to find more information American Diabetes Association: www.diabetes.org Association of Diabetes Care and Education Specialists: www.diabeteseducator.org Contact a health care provider if: Your blood glucose is at or above 240 mg/dL (81.1 mmol/L) for 2 days in a row. You have been sick or have had a fever for 2 days or more, and you are not getting better. You have any of these problems for more than 6 hours: You cannot eat or drink. You feel nauseous. You vomit. You have diarrhea. Get help right away if: Your blood glucose is lower than 54 mg/dL (3 mmol/L). You get confused. You have trouble thinking clearly. You have trouble breathing. These symptoms may represent a serious problem that is an emergency. Do not wait to see if the symptoms will go away. Get medical help right away. Call your local emergency services (911 in the [place] Do not drive yourself to the hospital. Summary Diabetes mellitus is a chronic disease that occurs when the body does not properly use  sugar (glucose) that is released from food after you eat. Take insulin and diabetes medicines as told. Check your blood glucose every day, as often as told. Keep all follow-up visits. This is important. This information is not intended to replace advice given to you by your health care provider. Make sure you discuss any questions you have with your health care provider. Document Revised: 01/02/2020 Document Reviewed: 01/02/2020 Elsevier Patient Education  2024 Elsevier Inc.    If you have been instructed to have an in-person evaluation today at a local Urgent Care facility, please use the link below. It will take you to a list of all of our available [facility], including address, phone number and hours of operation. Please do not delay care.  [facility]  If you or a family member do not have a primary care provider, use the link below to schedule a visit and establish care. When you choose a [facility] primary care physician or advanced practice provider, you gain a long-term partner in health. Find a Primary Care Provider  Learn more about [facility] in-office and virtual care options: [facility] - Get Care Now

## 2023-08-16 ENCOUNTER — Telehealth: Payer: Self-pay | Admitting: Family Medicine

## 2023-08-16 NOTE — Telephone Encounter (Signed)
Has questions about her diabetes after birth, would like a nurse to call back.

## 2023-08-17 ENCOUNTER — Telehealth: Payer: Self-pay | Admitting: Lactation Services

## 2023-08-17 MED ORDER — INSULIN SYRINGE-NEEDLE U-100 30G X 5/16" 0.5 ML MISC: 1.0000 | Freq: Two times a day (BID) | 3 refills | Status: DC

## 2023-08-17 NOTE — Telephone Encounter (Signed)
Returned patient's call. She reports she was given syringes (20 pack). Reordered enough for the month.   She reports her blood sugars in the evening have been 67-70. She reports fasting was 90's. She ate with the lows and they did come up.   Reviewed eating protein along with carbs to help sustain glucose over time. Patient voiced understanding.   Reviewed with patient she needs to establish with a PCP asap or can go to an Urgent Care if needed for quicker service.   Reviewed following links on Virtual Visit from 11/29 or going to [email or web address] and click on Find a Doctor. Patient voiced understanding.

## 2023-08-17 NOTE — Telephone Encounter (Signed)
Received PA request for Insulin Aspart, not covered by insurance.   Called Pharmacy and was informed that the Insulin has been picked up on 11/28.   Nothing further needed.

## 2023-08-18 ENCOUNTER — Other Ambulatory Visit: Payer: PRIVATE HEALTH INSURANCE

## 2023-08-20 ENCOUNTER — Telehealth (HOSPITAL_COMMUNITY): Payer: Self-pay | Admitting: *Deleted

## 2023-08-20 NOTE — Telephone Encounter (Signed)
08/20/2023  Name: Linda Barry MRN: [id number] DOB: [birth year]  Reason for Call:  Transition of Care Hospital Discharge Call  Contact Status: Patient Contact Status: Complete  Language assistant needed: Interpreter Mode: Interpreter Not Needed        Follow-Up Questions: Do You Have Any Concerns About Your Health As You Heal From Delivery?: Yes What Concerns Do You Have About Your Health?: Continues to have episodes of hypoglycemia.  Continuous monitor fell off yesterday and she has been doing finger sticks to check CBGs.  Says she has another continuous monitor that she can apply.  Encouraged use of the continuous monitor as it will alert her to hypoglycemia which is especially helpful overnight.  Also encouraged her to eat a snack with protein before bed.  Says she has a f/u appointment on Dec 12 regarding her diabetes managment - encouraged patient to keep appoinment. Do You Have Any Concerns About Your Infants Health?: Infant in NICU  Edinburgh Postnatal Depression Scale:  In the Past 7 Days: I have been able to laugh and see the funny side of things.: As much as I always could I have looked forward with enjoyment to things.: As much as I ever did I have blamed myself unnecessarily when things went wrong.: No, never I have been anxious or worried for no good reason.: Yes, sometimes I have felt scared or panicky for no good reason.: No, not at all Things have been getting on top of me.: No, most of the time I have coped quite well I have been so unhappy that I have had difficulty sleeping.: Not at all I have felt sad or miserable.: Not very often I have been so unhappy that I have been crying.: No, never The thought of harming myself has occurred to me.: Never Edinburgh Postnatal Depression Scale Total: 4  PHQ2-9 Depression Scale:     Discharge Follow-up: Edinburgh score requires follow up?: No  Post-discharge interventions: NA  Salena Saner, RN 08/20/2023 14:51

## 2023-08-24 DIAGNOSIS — Z0289 Encounter for other administrative examinations: Secondary | ICD-10-CM

## 2023-08-25 ENCOUNTER — Ambulatory Visit (HOSPITAL_COMMUNITY): Payer: Self-pay

## 2023-08-25 NOTE — Lactation Note (Signed)
This note was copied from a baby's chart.  NICU Lactation Consultation Note  Patient Name: Linda Barry [id number] Date: 08/25/2023 Age:30 wk.o.  Reason for consult: Weekly NICU follow-up; NICU baby; Maternal endocrine disorder; Preterm <34wks; Infant < 6lbs Type of Endocrine Disorder?: Diabetes (T2DM (insulin))  SUBJECTIVE Visited with family of 82 73/61 weeks old AGA NICU female; Ms. Marston is a P4 and reports she's not pumping every 3 hours due to a hectic schedule of having to come to the hospital and from home; but she's aware that pumping should be done frequently. She denies any S/S of engorgement at this time but voiced that she can feel when her breasts are getting "full" and it's time to pump. Reviewed strategies to increase supply and how consistent pumping will allow her to establish a full supply and aid with the prevention of engorgement.   OBJECTIVE Infant data: Mother's Current Feeding Choice: Breast Milk  O2 Device: Jet Vent FiO2 (%): 40 %  Infant feeding assessment No data recorded  Maternal data: [id number] Vaginal, Spontaneous Pumping frequency: 3-4 times/24 hours Pumped volume: 75 mL Flange Size: 21  WIC Program: Yes WIC Referral Sent?: Yes What county?: [place] Pump: WIC Pump  ASSESSMENT Infant: Feeding Status: Scheduled 9-12-3-6 Feeding method: Tube/Gavage (Bolus)  Maternal: Milk volume: Low  INTERVENTIONS/PLAN Interventions: Interventions: Breast feeding basics reviewed; DEBP; Education; Coconut oil Discharge Education: Engorgement and breast care Tools: Pump; Flanges; Coconut oil Pump Education: Setup, frequency, and cleaning; Milk Storage  Plan: Encouraged pumping every 3 hours, ideally 8 pumping sessions/24 hours She'll try power pumping in the AM STS with baby whenever possible    No other support person at this time. All questions and concerns answered, family to contact [facility] services PRN.  Consult Status: NICU follow-up NICU  Follow-up type: Weekly NICU follow up   Makhiya Coburn S Philis Nettle 08/25/2023, 1:13 PM

## 2023-08-26 ENCOUNTER — Other Ambulatory Visit: Payer: Self-pay

## 2023-08-26 ENCOUNTER — Other Ambulatory Visit: Payer: Self-pay | Admitting: Obstetrics and Gynecology

## 2023-08-26 ENCOUNTER — Other Ambulatory Visit: Payer: PRIVATE HEALTH INSURANCE

## 2023-08-26 DIAGNOSIS — Z3A26 26 weeks gestation of pregnancy: Secondary | ICD-10-CM

## 2023-08-26 NOTE — Progress Notes (Unsigned)
Patient scheduled for postpartum GTT today. Unclear if patient needs GTT today. I reviewed with Donavan Foil, MD. Appears patient has type 2 diabetes and was in DKA during hospital admission for birth. Provider states patient should not have GTT today. While I was speaking

## 2023-08-27 LAB — GLUCOSE TOLERANCE, 2 HOURS
Glucose, 2 hour: 137 mg/dL (ref 70–139)
Glucose, GTT - Fasting: 98 mg/dL (ref 70–99)

## 2023-09-06 ENCOUNTER — Ambulatory Visit (HOSPITAL_COMMUNITY): Payer: Self-pay

## 2023-09-06 NOTE — Lactation Note (Signed)
This note was copied from a baby's chart.  NICU Lactation Consultation Note  Patient Name: Girl Ghianna Devol [id number]'[id number] Date: 09/06/2023 Age:30 wk.o.  Reason for consult: Follow-up assessment; Preterm <34wks; NICU baby; Maternal endocrine disorder Type of Endocrine Disorder?: Diabetes (T2DM (insulin))  SUBJECTIVE  P4 mom consulted at 3-wk old infant's bedside. Mom just arrived to deliver EBM to room. Mom reports pumping every 3-4 hours with an output of 30ml combined per session. Mom expressed difficulties pumping overnight and achieving 8 session per day due to at-home responsibilities and having other children. Mom is also preparing to return to work in January. LC reviewed education on pumping frequency and educated on the Feedback Inhibitor of Lactation hormone (FIL) which releases when the breasts are full signaling for milk production to stop. Recommended that mom continues to strive for 8 pumping sessions, pumping overnight if possible to help keep milk production optimal. Reviewed power pumping and to allow her increase of supply.   Mom reported having a clog in her left breast. LC assessed and discovered palpable clogs in medial outer quadrant of the left breast. Discussed clog-removal strategies according to ABM guidelines: avoid heat, use cold compresses, gentle lymphatic breast massage and to "empty" the breasts every 3 hours if possible. Mom verbalized understanding.  LC briefly hand expressed and collected ~49ml MBM. Mom had to get back home but appreciated LC support.  OBJECTIVE Infant data: Mother's Current Feeding Choice: Breast Milk  O2 Device: Ventilator FiO2 (%): 28 %  Infant feeding assessment No data recorded  Maternal data: [id number] Vaginal, Spontaneous Pumping frequency: mom reports pumping every 3-4 hours (every 3-4 hours) Pumped volume: 30 mL (several EBM bottles brought from home) Flange Size: 21 Risk factor for low/delayed milk supply:: infant  separation  WIC Program: Yes WIC Referral Sent?: Yes What county?: [place] Pump: WIC Pump  ASSESSMENT Infant:   Feeding Status: Continuous gastric feedings Feeding method: Continuous Gastric  Maternal: Milk volume: Low  INTERVENTIONS/PLAN Interventions: Interventions: Breast massage; Breast feeding basics reviewed; Education Pump Education: Setup, frequency, and cleaning  Plan: Consult Status: NICU follow-up NICU Follow-up type: Weekly NICU follow up  Pump every 3 hours (strive for 8+ sessions in 24 hours). Try power pumping in the morning and once in the evening to help with milk supply Add in lymphatic breast massage to help with clogs and breast discomfort. Use cold compresses to reduce inflammation Call lactation for support when visiting baby in the NICU.  T'Errah N Javana Schey 09/06/2023, 1:15 PM

## 2023-09-16 ENCOUNTER — Encounter: Payer: Self-pay | Admitting: Family Medicine

## 2023-09-16 ENCOUNTER — Ambulatory Visit (INDEPENDENT_AMBULATORY_CARE_PROVIDER_SITE_OTHER): Payer: Medicaid Other | Admitting: Family Medicine

## 2023-09-16 VITALS — BP 110/70 | HR 78 | Temp 98.4°F | Ht 66.6 in | Wt 209.0 lb

## 2023-09-16 DIAGNOSIS — Z6833 Body mass index (BMI) 33.0-33.9, adult: Secondary | ICD-10-CM | POA: Diagnosis not present

## 2023-09-16 DIAGNOSIS — E7849 Other hyperlipidemia: Secondary | ICD-10-CM | POA: Diagnosis not present

## 2023-09-16 DIAGNOSIS — E66811 Obesity, class 1: Secondary | ICD-10-CM

## 2023-09-16 DIAGNOSIS — E088 Diabetes mellitus due to underlying condition with unspecified complications: Secondary | ICD-10-CM

## 2023-09-16 DIAGNOSIS — Z794 Long term (current) use of insulin: Secondary | ICD-10-CM | POA: Diagnosis not present

## 2023-09-16 DIAGNOSIS — Z7689 Persons encountering health services in other specified circumstances: Secondary | ICD-10-CM

## 2023-09-16 LAB — POCT URINALYSIS DIPSTICK
Bilirubin, UA: NEGATIVE
Glucose, UA: NEGATIVE
Ketones, UA: NEGATIVE
Leukocytes, UA: NEGATIVE
Nitrite, UA: NEGATIVE
Protein, UA: NEGATIVE
Spec Grav, UA: 1.02 (ref 1.010–1.025)
Urobilinogen, UA: 0.2 U/dL
pH, UA: 5.5 (ref 5.0–8.0)

## 2023-09-16 MED ORDER — LANCET DEVICE MISC: 1.0000 | Freq: Three times a day (TID) | 3 refills | Status: DC

## 2023-09-16 MED ORDER — BLOOD GLUCOSE TEST VI STRP: 1.0000 | ORAL_STRIP | Freq: Three times a day (TID) | 3 refills | Status: DC

## 2023-09-16 NOTE — Progress Notes (Signed)
 Linda Barry, CMA,acting as a neurosurgeon for [employer], NP.,have documented all relevant documentation on the behalf of Linda Creighton, NP,as directed by  Linda Creighton, NP while in the presence of Linda Creighton, NP.  Subjective:  Patient ID: Linda Barry , female    DOB: June 10, 1991 , 33 y.o.   MRN: [id number]  Chief Complaint  Patient presents with   Establish Care   Diabetes    HPI  Patient is a 33 year old female who presents today to establish primary care.  Patient states she had a premature baby on 08/10/23, and the baby is currently in the NICU at [facility] hospital in [place], [place]. Patient states that she had gestational diabetes during her pregnancy and she would loved to be checked for diabetes. She states that she still takes insulin  before meals and takes medication by mouth also.She states that usually before that her insulin  her BS is in the 70s and 100s when she did not take insulin  . Will check labs today.     Past Medical History:  Diagnosis Date   Depression    Placenta abruption, delivered, current hospitalization 04/15/2012   Sickle cell trait (HCC)    SVD (spontaneous vaginal delivery) 04/15/2012     Family History  Problem Relation Age of Onset   Diabetes Maternal Grandmother    Other Neg Hx      Current Outpatient Medications:    Blood Glucose Monitoring Suppl DEVI, 1 each by Does not apply route 3 (three) times daily. May dispense any manufacturer covered by patient's insurance., Disp: 1 each, Rfl: 0   ibuprofen  (ADVIL ) 600 MG tablet, Take 1 tablet (600 mg total) by mouth every 6 (six) hours., Disp: 30 tablet, Rfl: 0   Glucose Blood (BLOOD GLUCOSE TEST STRIPS) STRP, 1 each by Does not apply route 3 (three) times daily. Use as directed to check blood sugar. May dispense any manufacturer covered by patient's insurance and fits patient's device., Disp: 100 strip, Rfl: 3   insulin  aspart (NOVOLOG ) 100 UNIT/ML injection, Inject 10 Units into the skin 2 (two) times  daily before a meal. (Patient not taking: Reported on 09/16/2023), Disp: 10 mL, Rfl: 11   Insulin  Pen Needle 31G X 5 MM MISC, Please dispense insulin  syringes to draw insulin  out of vial (Patient not taking: Reported on 09/16/2023), Disp: 14 each, Rfl: 0   Insulin  Syringe-Needle U-100 (SAFETY INSULIN  SYRINGES) 30G X 5/16 0.5 ML MISC, 1 Syringe by Does not apply route 2 (two) times daily. (Patient not taking: Reported on 09/16/2023), Disp: 100 each, Rfl: 3   Lancet Device MISC, 1 each by Does not apply route 3 (three) times daily. May dispense any manufacturer covered by patient's insurance., Disp: 1 each, Rfl: 3   Lancets MISC, 1 each by Does not apply route 3 (three) times daily. Use as directed to check blood sugar. May dispense any manufacturer covered by patient's insurance and fits patient's device. (Patient not taking: Reported on 09/16/2023), Disp: 100 each, Rfl: 0   metFORMIN  (GLUCOPHAGE ) 500 MG tablet, Take 1 tablet (500 mg total) by mouth 2 (two) times daily at 8 am and 10 pm. (Patient not taking: Reported on 09/16/2023), Disp: 60 tablet, Rfl: 1   Allergies  Allergen Reactions   Latex Other (See Comments)    Gloves irritate eczema on hands, no problems elsewhere per patient     Review of Systems  Constitutional: Negative.   HENT: Negative.    Eyes: Negative.   Respiratory: Negative.  Cardiovascular: Negative.   Gastrointestinal: Negative.   Endocrine: Negative for polydipsia, polyphagia and polyuria.  Neurological: Negative.   Psychiatric/Behavioral: Negative.       Today's Vitals   09/16/23 1053  BP: 110/70  Pulse: 78  Temp: 98.4 F (36.9 C)  Weight: 209 lb (94.8 kg)  Height: 5' 6.6 (1.692 m)  PainSc: 0-No pain   Body mass index is 33.13 kg/m.  Wt Readings from Last 3 Encounters:  09/16/23 209 lb (94.8 kg)  08/09/23 199 lb 9.6 oz (90.5 kg)  08/04/23 212 lb (96.2 kg)    The ASCVD Risk score (Arnett DK, et al., 2019) failed to calculate for the following reasons:   The  2019 ASCVD risk score is only valid for ages 80 to 69  Objective:  Physical Exam HENT:     Head: Normocephalic.  Cardiovascular:     Rate and Rhythm: Normal rate.  Pulmonary:     Effort: Pulmonary effort is normal.     Breath sounds: Normal breath sounds.  Skin:    General: Skin is warm and dry.  Neurological:     Mental Status: She is alert and oriented to person, place, and time.  Psychiatric:        Behavior: Behavior normal.         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 Use as directed to check blood sugar. May dispense any manufacturer covered by patient's insurance and fits patient's device.  Dispense: 100 strip; Refill: 3 -     Lancet Device; 1 each by Does not apply route 3 (three) times daily. May dispense any manufacturer covered by patient's insurance.  Dispense: 1 each; Refill: 3  Hyperlipidemia due to dietary fat intake -     Lipid panel  Class 1 obesity with serious comorbidity and body mass index (BMI) of 33.0 to 33.9 in adult, unspecified obesity type Assessment & Plan: She is encouraged to strive for BMI less than 30 to decrease cardiac risk. Advised to aim for at least 150 minutes of exercise per week.      Return in about 3 months (around 12/15/2023) for diabetes.  Patient was given opportunity to ask questions. Patient verbalized understanding of the plan and was able to repeat key elements of the plan. All questions were answered to their satisfaction.   I, Linda Creighton, NP, have reviewed all documentation for this visit. The documentation on 09/20/2023 for the exam, diagnosis, procedures, and orders are all  accurate and complete.    IF YOU HAVE BEEN REFERRED TO A SPECIALIST, IT MAY TAKE 1-2 WEEKS TO SCHEDULE/PROCESS THE REFERRAL. IF YOU HAVE NOT HEARD FROM US /SPECIALIST IN TWO WEEKS, PLEASE GIVE US  A CALL AT [phone] X [phone].

## 2023-09-17 ENCOUNTER — Encounter: Payer: Self-pay | Admitting: Obstetrics & Gynecology

## 2023-09-17 LAB — CBC
Hematocrit: 33.5 % — ABNORMAL LOW (ref 34.0–46.6)
Hemoglobin: 10 g/dL — ABNORMAL LOW (ref 11.1–15.9)
MCH: 23.6 pg — ABNORMAL LOW (ref 26.6–33.0)
MCHC: 29.9 g/dL — ABNORMAL LOW (ref 31.5–35.7)
MCV: 79 fL (ref 79–97)
Platelets: 285 10*3/uL (ref 150–450)
RBC: 4.23 x10E6/uL (ref 3.77–5.28)
RDW: 14.5 % (ref 11.7–15.4)
WBC: 7.4 10*3/uL (ref 3.4–10.8)

## 2023-09-17 LAB — CMP14+EGFR
ALT: 19 [IU]/L (ref 0–32)
AST: 12 [IU]/L (ref 0–40)
Albumin: 3.9 g/dL — ABNORMAL LOW (ref 4.0–5.0)
Alkaline Phosphatase: 126 [IU]/L — ABNORMAL HIGH (ref 44–121)
BUN/Creatinine Ratio: 16 (ref 9–23)
BUN: 11 mg/dL (ref 6–20)
Bilirubin Total: 0.2 mg/dL (ref 0.0–1.2)
CO2: 20 mmol/L (ref 20–29)
Calcium: 9.2 mg/dL (ref 8.7–10.2)
Chloride: 107 mmol/L — ABNORMAL HIGH (ref 96–106)
Creatinine, Ser: 0.67 mg/dL (ref 0.57–1.00)
Globulin, Total: 2.9 g/dL (ref 1.5–4.5)
Glucose: 96 mg/dL (ref 70–99)
Potassium: 4.5 mmol/L (ref 3.5–5.2)
Sodium: 140 mmol/L (ref 134–144)
Total Protein: 6.8 g/dL (ref 6.0–8.5)
eGFR: 121 mL/min/{1.73_m2} (ref >59)

## 2023-09-17 LAB — MICROALBUMIN / CREATININE URINE RATIO
Creatinine, Urine: 93.8 mg/dL
Microalb/Creat Ratio: <3 mg/g{creat} (ref 0–29)
Microalbumin, Urine: <3 ug/mL

## 2023-09-17 LAB — LIPID PANEL
Chol/HDL Ratio: 4.5 {ratio} — ABNORMAL HIGH (ref 0.0–4.4)
Cholesterol, Total: 232 mg/dL — ABNORMAL HIGH (ref 100–199)
HDL: 51 mg/dL (ref >39)
LDL Chol Calc (NIH): 170 mg/dL — ABNORMAL HIGH (ref 0–99)
Triglycerides: 67 mg/dL (ref 0–149)
VLDL Cholesterol Cal: 11 mg/dL (ref 5–40)

## 2023-09-17 LAB — HEMOGLOBIN A1C
Est. average glucose Bld gHb Est-mCnc: 154 mg/dL
Hgb A1c MFr Bld: 7 % — ABNORMAL HIGH (ref 4.8–5.6)

## 2023-09-21 DIAGNOSIS — Z7689 Persons encountering health services in other specified circumstances: Secondary | ICD-10-CM | POA: Insufficient documentation

## 2023-09-21 DIAGNOSIS — Z794 Long term (current) use of insulin: Secondary | ICD-10-CM | POA: Insufficient documentation

## 2023-09-21 DIAGNOSIS — Z6833 Body mass index (BMI) 33.0-33.9, adult: Secondary | ICD-10-CM | POA: Insufficient documentation

## 2023-09-21 DIAGNOSIS — E7849 Other hyperlipidemia: Secondary | ICD-10-CM | POA: Insufficient documentation

## 2023-09-21 NOTE — Progress Notes (Signed)
 Hemoglobin/Hematocrit levels are low. Are you still taking your prenatal vitamins, if no please get back on it or start taking once daily over the counter Iron  tablets.A1c has dropped to 7.0 from 9.1, Total cholesterol is elevated. Continue Metformin  twice daily, adhere to a low carb and low fat diet. Daily exercise advised too.  Thanks!

## 2023-09-21 NOTE — Assessment & Plan Note (Signed)
 She is encouraged to strive for BMI less than 30 to decrease cardiac risk. Advised to aim for at least 150 minutes of exercise per week.

## 2023-09-23 ENCOUNTER — Encounter: Payer: Self-pay | Admitting: Family Medicine

## 2023-09-23 ENCOUNTER — Ambulatory Visit (INDEPENDENT_AMBULATORY_CARE_PROVIDER_SITE_OTHER): Payer: Medicaid Other | Admitting: Obstetrics & Gynecology

## 2023-09-23 ENCOUNTER — Other Ambulatory Visit: Payer: Self-pay

## 2023-09-23 MED ORDER — SLYND 4 MG PO TABS: 1.0000 | ORAL_TABLET | Freq: Every day | ORAL | 10 refills | Status: DC

## 2023-09-23 NOTE — Progress Notes (Signed)
    Post Partum Visit Note  Linda Barry is a 33 y.o. [phone] female who presents for a postpartum visit. She is 6 weeks postpartum following a normal spontaneous vaginal delivery.  I have fully reviewed the prenatal and intrapartum course. The delivery was at 26.2 gestational weeks.  Anesthesia: epidural. Postpartum course has been good. Baby is doing well in NICU. Baby is feeding by breast. Bleeding no bleeding. Bowel function is normal. Bladder function is normal. Patient is sexually active. Contraception method is OCP (estrogen/progesterone). Postpartum depression screening: negative.   The pregnancy intention screening data noted above was reviewed. Potential methods of contraception were discussed. The patient elected to proceed with No data recorded.    Health Maintenance Due  Topic Date Due   FOOT EXAM  Never done   OPHTHALMOLOGY EXAM  Never done   COVID-19 Vaccine (1 - 2024-25 season) Never done    The following portions of the patient's history were reviewed and updated as appropriate: allergies, current medications, past family history, past medical history, past social history, past surgical history, and problem list.  Review of Systems Pertinent items are noted in HPI.  Objective:  LMP 03/08/2023    General:  alert, cooperative, and no distress   Breasts:  not indicated  Lungs:   Heart:    Abdomen: soft, non-tender; bowel sounds normal; no masses,  no organomegaly   Wound   GU exam:  not indicated       Assessment:    There are no diagnoses linked to this encounter.  normal postpartum exam.   Plan:   Essential components of care per ACOG recommendations:  1.  Mood and well being: Patient with negative depression screening today. Reviewed local resources for support.  - Patient tobacco use? No.   - hx of drug use? No.    2. Infant care and feeding:  -Patient currently breastmilk feeding? Yes. Discussed returning to work and pumping.  -Social determinants  of health (SDOH) reviewed in EPIC. No concerns 3. Sexuality, contraception and birth spacing - Patient does not want a pregnancy in the next year.  Desired family size is 4 children.  - Reviewed reproductive life planning. Reviewed contraceptive methods based on pt preferences and effectiveness.  Patient desired Oral Contraceptive today.   - Discussed birth spacing of 18 months  4. Sleep and fatigue -Encouraged family/partner/community support of 4 hrs of uninterrupted sleep to help with mood and fatigue  5. Physical Recovery  - Discussed patients delivery and complications. She describes her labor as good. - Patient had a Vaginal, no problems at delivery. Patient had no laceration. Perineal healing reviewed. Patient expressed understanding - Patient has urinary incontinence? No. - Patient is safe to resume physical and sexual activity  6.  Health Maintenance - HM due items addressed Yes - Last pap smear No results found for: DIAGPAP Pap smear not done at today's visit.  -Breast Cancer screening indicated? No.   7. Chronic Disease/Pregnancy Condition follow up:  DM  - PCP follow up  Eveline Lynwood MATSU, MD  Center for [facility], [facility]

## 2023-09-24 ENCOUNTER — Ambulatory Visit: Payer: PRIVATE HEALTH INSURANCE

## 2023-09-29 ENCOUNTER — Telehealth: Payer: Self-pay | Admitting: Lactation Services

## 2023-09-29 NOTE — Telephone Encounter (Signed)
 PA for Slynd  submitted through covermymeds. Awaiting determination.    Meilyn Josue (Key: [id number]) Rx #: [id number] Need Help? Call us  at [phone] Status sent iconSent to Plan today Drug Slynd  4MG  tablets ePA cloud logo Form [employer] Electronic Prior Authorization Form (2017 NCPDP) Original Claim Info 70 PA Req, Dr submit ePA at [facility].comOr MD Call [phone], Possible 3 DSSUBMIT 03 Lvl of Service PA# 72 PA TYPE8

## 2023-09-29 NOTE — Telephone Encounter (Signed)
 Linda Barry (Key: [id number]) PA Case ID #: [id number] Rx #: [id number] Need Help? Call us  at [phone] Outcome Denied today by [facility] 2017 NCPDP Request Reference Number: [id number]. SLYND  TAB 4MG  is denied for not meeting the prior authorization requirement(s). For further questions, call Community & State at [phone] for more information. Drug Slynd  4MG  tablets ePA cloud logo Form [employer] Electronic Prior Authorization Form (2017 NCPDP) Original Claim Info 70 PA Req, Dr submit ePA at [employer].comOr MD Call [phone], Possible 3 DSSUBMIT 03 Lvl of Service PA# 72 PA TYPE8  PA denied for Slynd . Will route to Dr. Willey Harrier to inquire about using Micronor instead.

## 2023-10-10 ENCOUNTER — Ambulatory Visit (HOSPITAL_COMMUNITY): Payer: Self-pay

## 2023-10-10 NOTE — Lactation Note (Signed)
This note was copied from a baby's chart.  NICU Lactation Consultation Note  Patient Name: Linda Barry [id number] Date: 10/10/2023 Age:31 m.o.  Reason for consult: Weekly NICU follow-up; Other (Comment); Exclusive pumping and bottle feeding; Late-preterm 34-36.6wks; Maternal endocrine disorder; NICU baby (Telephone call) Type of Endocrine Disorder?: Diabetes (T2DM (insulin))  SUBJECTIVE This LC tried visiting with family multiple times but since Ms. Carns went back to work her visiting hours have varied a lot. Spoke with Ms. Pestka over the phone to check on her pumping status and she voiced that everything is going well, the clogged duct she had resolved with icing. When inquired if she's planning on taking baby to breast and offered latch assistance she voiced that her plan is to exclusively pump and bottle feed baby "Linda Barry". Once she gets her home she'll be doing EBM/formula in a bottle due to her supply decreasing.  Reviewed strategies to increase supply and let her know that it will be at least a week to see the results of these interventions. She voiced she just feel more comfortable relying on formula whenever her supply is not enough, respected her wishes.   OBJECTIVE Infant data: Mother's Current Feeding Choice: Breast Milk  O2 Device: HHFNC O2 Flow Rate (L/min): 2 L/min FiO2 (%): 29 %  Infant feeding assessment IDFTS - Readiness: 3 IDFTS - Quality: 3   Maternal data: [id number] Vaginal, Spontaneous Pumping frequency: 5 times/24 hours Pumped volume: 90 mL (90-150 ml)  WIC Program: Yes WIC Referral Sent?: Yes What county?: [place] Pump: WIC Pump  ASSESSMENT Infant: Feeding Status: Scheduled 8-11-2-5 Feeding method: Tube/Gavage (Bolus) Nipple Type: Nfant Extra Slow Flow (gold)  Maternal: Milk volume: Low  INTERVENTIONS/PLAN Interventions: Interventions: Education Tools: 17F feeding tube / Syringe  Plan: Encouraged pumping every 3 hours, ideally 8 pumping  sessions/24 hours She'll try power pumping in the AM STS with baby whenever possible    All questions and concerns answered, family to contact LC services PRN.  Consult Status: NICU follow-up NICU Follow-up type: Weekly NICU follow up   Ivelise Castillo S Philis Nettle 10/10/2023, 10:23 AM

## 2023-10-22 ENCOUNTER — Ambulatory Visit (HOSPITAL_COMMUNITY): Payer: Self-pay

## 2023-10-22 NOTE — Lactation Note (Signed)
 This note was copied from a baby's chart.  NICU Lactation Consultation Note  Patient Name: Linda Barry Date: 10/22/2023 Age:31 m.o.  Reason for consult: Weekly NICU follow-up; Exclusive pumping and bottle feeding; NICU baby; Late-preterm 34-36.6wks; Maternal endocrine disorder Type of Endocrine Disorder?: Diabetes (T2DM (insulin ))  SUBJECTIVE Visited with family of 38 12/89 weeks old AGA NICU female; Ms. Amodio is a P4 and reported she's still pumping and her supply remains stable, praised her for all her efforts. She voiced that it's challenging to wake up at night but she doesn't go more than 6 hours without pumping at night normally. She hasn't tried power pumping yet but it's willing to try if it fits her schedule, she's back to work. Revised the importance of consistent pumping along with strategies to increase supply and benefits of STS care; baby Linda Barry is on a 100% breastmilk diet. Left family with RN and photographer, baby Linda Barry is having her Valentine's day pics taken today.   OBJECTIVE Infant data: Mother's Current Feeding Choice: Breast Milk  O2 Device: Nasal Cannula O2 Flow Rate (L/min): 0.1 L/min FiO2 (%): 100 %  Infant feeding assessment IDFTS - Readiness: 2 IDFTS - Quality: 3   Maternal data: [id number] Vaginal, Spontaneous Pumping frequency: 5-6 times/24 hours Pumped volume: 90 mL (90-150 ml)  WIC Program: Yes WIC Referral Sent?: Yes What county?: [place] Pump: WIC Pump  ASSESSMENT Infant: Feeding Status: Scheduled 9-12-3-6 Feeding method: Bottle; Tube/Gavage (Bolus) Nipple Type: Nfant Extra Slow Flow (gold)  Maternal: Milk volume: Low  INTERVENTIONS/PLAN Interventions: Interventions: Breast feeding basics reviewed; DEBP; Education  Plan: Encouraged pumping every 3 hours, ideally 8 pumping sessions/24 hours She'll try power pumping once/day whenever it fits her schedule STS with baby whenever possible    Female visitor present. All  questions and concerns answered, family to contact [facility] services PRN.  Consult Status: NICU follow-up NICU Follow-up type: Weekly NICU follow up   Terrion Gencarelli S Miriam 10/22/2023, 2:56 PM

## 2023-11-21 ENCOUNTER — Ambulatory Visit (HOSPITAL_COMMUNITY): Payer: Self-pay

## 2023-11-21 NOTE — Lactation Note (Signed)
 This note was copied from a baby's chart.  NICU Lactation Consultation Note  Patient Name: Linda Barry [id number] Date: 11/21/2023 Age:31 m.o.  Reason for consult: Weekly NICU follow-up; Maternal endocrine disorder; Term; NICU baby; Exclusive pumping and bottle feeding Type of Endocrine Disorder?: Diabetes (T2DM (insulin))  SUBJECTIVE Visited with family of 3 68/36 weeks old AGA NICU female; Ms. Peragine continues pumping for baby "Linda Barry"; her supply remains stable, praised her for her efforts. She voiced that her output is slightly less at night when she pumps "laying down" Vs the daytime when she pumps sitting up. Let her know that it might be the time of the day and not to position what makes the slight difference in her output. Revised strategies to increase supply. She had a question regarding breastfeeding and alcohol, explained that she didn't need to pump and dump as long a she's having just one drink (1 oz liquor, 5 oz wine or 12 oz of beer). She's concerned about baby "Linda Barry" not doing well with PO now that she is post-term; she wanted to make sure it is not her breastmilk and would like to do a trial with formula to see if that helps with PO feedings before considering doing the G tube. Discussed about the likelihood of breastmilk contributing to poor PO feedings, she's not currently on any thickeners, but passed the message to NICU RN Misty Stanley to do a formula trial. SLP to F/U for feeding readiness.   OBJECTIVE Infant data: Mother's Current Feeding Choice: Breast Milk  O2 Device: Room Air  Infant feeding assessment IDFTS - Readiness: 2 IDFTS - Quality: 2   Maternal data: [id number] Vaginal, Spontaneous Pumping frequency: 5 times/24 hours (sometimes 6) Pumped volume: 90 mL (90-150 ml)  WIC Program: Yes WIC Referral Sent?: Yes What county?: [place] Pump: WIC Pump  ASSESSMENT Infant: Feeding Status: Scheduled 8-11-2-5 Feeding method: Bottle; Tube/Gavage (Bolus) Nipple Type: Dr.  Levert Feinstein Preemie  Maternal: Milk volume: Low  INTERVENTIONS/PLAN Interventions: Interventions: Breast feeding basics reviewed; DEBP; Education  Plan: Encouraged pumping every 3 hours, ideally 8 pumping sessions/24 hours She'll try power pumping once/day whenever it fits her schedule NICU team will do a formula trial per family request prior considering G-tube   No other support person at this time. All questions and concerns answered, family to contact [facility] services PRN.  Consult Status: NICU follow-up NICU Follow-up type: Weekly NICU follow up   Cindee Mclester S Litha Lamartina 11/21/2023, 6:18 PM

## 2023-12-15 ENCOUNTER — Encounter: Payer: Self-pay | Admitting: Family Medicine

## 2023-12-15 ENCOUNTER — Ambulatory Visit: Payer: Medicaid Other | Admitting: Family Medicine

## 2023-12-15 VITALS — BP 110/60 | HR 88 | Temp 98.2°F | Wt 231.0 lb

## 2023-12-15 DIAGNOSIS — Z794 Long term (current) use of insulin: Secondary | ICD-10-CM | POA: Diagnosis not present

## 2023-12-15 DIAGNOSIS — Z6836 Body mass index (BMI) 36.0-36.9, adult: Secondary | ICD-10-CM

## 2023-12-15 DIAGNOSIS — E6609 Other obesity due to excess calories: Secondary | ICD-10-CM

## 2023-12-15 DIAGNOSIS — E66811 Obesity, class 1: Secondary | ICD-10-CM

## 2023-12-15 DIAGNOSIS — E66812 Obesity, class 2: Secondary | ICD-10-CM | POA: Diagnosis not present

## 2023-12-15 DIAGNOSIS — E088 Diabetes mellitus due to underlying condition with unspecified complications: Secondary | ICD-10-CM

## 2023-12-15 DIAGNOSIS — E7849 Other hyperlipidemia: Secondary | ICD-10-CM

## 2023-12-15 LAB — BASIC METABOLIC PANEL WITH GFR
BUN/Creatinine Ratio: 22 (ref 9–23)
BUN: 15 mg/dL (ref 6–20)
CO2: 23 mmol/L (ref 20–29)
Calcium: 10 mg/dL (ref 8.7–10.2)
Chloride: 101 mmol/L (ref 96–106)
Creatinine, Ser: 0.67 mg/dL (ref 0.57–1.00)
Glucose: 100 mg/dL — ABNORMAL HIGH (ref 70–99)
Potassium: 4.9 mmol/L (ref 3.5–5.2)
Sodium: 136 mmol/L (ref 134–144)
eGFR: 121 mL/min/{1.73_m2} (ref >59)

## 2023-12-15 LAB — CBC
Hematocrit: 39.4 % (ref 34.0–46.6)
Hemoglobin: 12.3 g/dL (ref 11.1–15.9)
MCH: 23.7 pg — ABNORMAL LOW (ref 26.6–33.0)
MCHC: 31.2 g/dL — ABNORMAL LOW (ref 31.5–35.7)
MCV: 76 fL — ABNORMAL LOW (ref 79–97)
Platelets: 229 10*3/uL (ref 150–450)
RBC: 5.19 x10E6/uL (ref 3.77–5.28)
RDW: 17 % — ABNORMAL HIGH (ref 11.7–15.4)
WBC: 9.4 10*3/uL (ref 3.4–10.8)

## 2023-12-15 LAB — LIPID PANEL
Chol/HDL Ratio: 4.2 ratio (ref 0.0–4.4)
Cholesterol, Total: 183 mg/dL (ref 100–199)
HDL: 44 mg/dL (ref >39)
LDL Chol Calc (NIH): 122 mg/dL — ABNORMAL HIGH (ref 0–99)
Triglycerides: 94 mg/dL (ref 0–149)
VLDL Cholesterol Cal: 17 mg/dL (ref 5–40)

## 2023-12-15 LAB — HEMOGLOBIN A1C
Est. average glucose Bld gHb Est-mCnc: 123 mg/dL
Hgb A1c MFr Bld: 5.9 % — ABNORMAL HIGH (ref 4.8–5.6)

## 2023-12-15 MED ORDER — MOUNJARO 2.5 MG/0.5ML ~~LOC~~ SOAJ: 2.5000 mg | SUBCUTANEOUS | 1 refills | Status: DC

## 2023-12-15 NOTE — Assessment & Plan Note (Addendum)
 Lab Results  Component Value Date   HGBA1C 7.0 (H) 09/16/2023   Change metformin to 500 mg once every day and start ozempic 0.25 mg SQ injection every 7 days. She denies hx of thyroid Cancer in her family.

## 2023-12-15 NOTE — Assessment & Plan Note (Addendum)
 Wt Readings from Last 3 Encounters:  12/15/23 231 lb (104.8 kg)  09/23/23 207 lb (93.9 kg)  09/16/23 209 lb (94.8 kg)   .RN

## 2023-12-15 NOTE — Patient Instructions (Signed)

## 2023-12-15 NOTE — Progress Notes (Signed)
 I,[name], CMA,acting as a Neurosurgeon for [employer], NP.,have documented all relevant documentation on the behalf of Ellender Hose, NP,as directed by  Ellender Hose, NP while in the presence of Ellender Hose, NP.  Subjective:  Patient ID: Linda Barry , female    DOB: 1990-11-01 , 33 y.o.   MRN: [id number]  Chief Complaint  Patient presents with   Diabetes   Hyperlipidemia    HPI  Patient is a 33 year old female who presents today for diabetes and cholesterol management. Patient states that she has been doing well with her medication but forgets to take the second dose of metformin . She has had diabetes since delivery  baby in November 2024.Patient denies having chest pain, shortness of breath or headaches at this time.  She states that she stopped breast feeding on March 20th. She has no other concerns at this time. Will change Metformin to once daily and start ozempic once weekly injection.        Past Medical History:  Diagnosis Date   Depression    Placenta abruption, delivered, current hospitalization 04/15/2012   Sickle cell trait (HCC)    SVD (spontaneous vaginal delivery) 04/15/2012     Family History  Problem Relation Age of Onset   Diabetes Maternal Grandmother    Other Neg Hx      Current Outpatient Medications:    Blood Glucose Monitoring Suppl DEVI, 1 each by Does not apply route 3 (three) times daily. May dispense any manufacturer covered by patient's insurance., Disp: 1 each, Rfl: 0   Glucose Blood (BLOOD GLUCOSE TEST STRIPS) STRP, 1 each by Does not apply route 3 (three) times daily. Use as directed to check blood sugar. May dispense any manufacturer covered by patient's insurance and fits patient's device., Disp: 100 strip, Rfl: 3   ibuprofen (ADVIL) 600 MG tablet, Take 1 tablet (600 mg total) by mouth every 6 (six) hours., Disp: 30 tablet, Rfl: 0   insulin aspart (NOVOLOG) 100 UNIT/ML injection, Inject 10 Units into the skin 2 (two) times daily before a  meal., Disp: 10 mL, Rfl: 11   Insulin Pen Needle 31G X 5 MM MISC, Please dispense insulin syringes to draw insulin out of vial, Disp: 14 each, Rfl: 0   Insulin Syringe-Needle U-100 (SAFETY INSULIN SYRINGES) 30G X 5/16" 0.5 ML MISC, 1 Syringe by Does not apply route 2 (two) times daily., Disp: 100 each, Rfl: 3   Lancet Device MISC, 1 each by Does not apply route 3 (three) times daily. May dispense any manufacturer covered by patient's insurance., Disp: 1 each, Rfl: 3   Lancets MISC, 1 each by Does not apply route 3 (three) times daily. Use as directed to check blood sugar. May dispense any manufacturer covered by patient's insurance and fits patient's device., Disp: 100 each, Rfl: 0   metFORMIN (GLUCOPHAGE) 500 MG tablet, Take 1 tablet (500 mg total) by mouth 2 (two) times daily at 8 am and 10 pm., Disp: 60 tablet, Rfl: 1   Semaglutide,0.25 or 0.5MG /DOS, 2 MG/3ML SOPN, Inject 0.25 mg into the skin once a week., Disp: 3 mL, Rfl: 1   Allergies  Allergen Reactions   Latex Other (See Comments)    Gloves irritate eczema on hands, no problems elsewhere per patient     Review of Systems  Constitutional: Negative.   Respiratory: Negative.    Cardiovascular:  Negative for chest pain, palpitations and leg swelling.  Gastrointestinal: Negative.   Endocrine: Negative for polydipsia, polyphagia and  polyuria.  Musculoskeletal: Negative.   Neurological: Negative.      Today's Vitals   12/15/23 1051  BP: 110/60  Pulse: 88  Temp: 98.2 F (36.8 C)  TempSrc: Oral  Weight: 231 lb (104.8 kg)  PainSc: 0-No pain   Body mass index is 36.62 kg/m.  Wt Readings from Last 3 Encounters:  12/15/23 231 lb (104.8 kg)  09/23/23 207 lb (93.9 kg)  09/16/23 209 lb (94.8 kg)    The ASCVD Risk score (Arnett DK, et al., 2019) failed to calculate for the following reasons:   The 2019 ASCVD risk score is only valid for ages 81 to 14  Objective:  Physical Exam HENT:     Head: Normocephalic.     Nose: Nose  normal.  Cardiovascular:     Rate and Rhythm: Normal rate.  Pulmonary:     Effort: Pulmonary effort is normal.     Breath sounds: Normal breath sounds.  Abdominal:     General: Abdomen is flat.  Musculoskeletal:     Cervical back: Normal range of motion.  Skin:    General: Skin is warm and dry.  Neurological:     General: No focal deficit present.     Mental Status: She is alert and oriented to person, place, and time.         Assessment And Plan:  Diabetes mellitus due to underlying condition with complication, with long-term current use of insulin [facility]) Assessment & Plan: Lab Results  Component Value Date   HGBA1C 7.0 (H) 09/16/2023   Change metformin to 500 mg once every day and start ozempic 0.25 mg SQ injection every 7 days. She denies hx of thyroid Cancer in her family.  Orders: -     CBC -     Hemoglobin A1c -     Semaglutide(0.25 or 0.5MG /DOS); Inject 0.25 mg into the skin once a week.  Dispense: 3 mL; Refill: 1  Hyperlipidemia due to dietary fat intake -     Basic metabolic panel with GFR -     Lipid panel  Class 2 obesity due to excess calories without serious comorbidity with body mass index (BMI) of 36.0 to 36.9 in adult Assessment & Plan: Wt Readings from Last 3 Encounters:  12/15/23 231 lb (104.8 kg)  09/23/23 207 lb (93.9 kg)  09/16/23 209 lb (94.8 kg)   .RN   Class 1 obesity due to excess calories with serious comorbidity and body mass index (BMI) of 33.0 to 33.9 in adult Assessment & Plan: She is encouraged to strive for BMI less than 30 to decrease cardiac risk. Advised to aim for at least 150 minutes of exercise per week.      Return for controlled DM check-4 months.  Patient was given opportunity to ask questions. Patient verbalized understanding of the plan and was able to repeat key elements of the plan. All questions were answered to their satisfaction.   I, Ellender Hose, NP, have reviewed all documentation for this visit. The documentation  on 12/22/2023 for the exam, diagnosis, procedures, and orders are all accurate and complete.    IF YOU HAVE BEEN REFERRED TO A SPECIALIST, IT MAY TAKE 1-2 WEEKS TO SCHEDULE/PROCESS THE REFERRAL. IF YOU HAVE NOT HEARD FROM US/SPECIALIST IN TWO WEEKS, PLEASE GIVE [place] A CALL AT [phone].

## 2023-12-16 ENCOUNTER — Telehealth: Payer: Self-pay | Admitting: Family Medicine

## 2023-12-16 MED ORDER — SEMAGLUTIDE(0.25 OR 0.5MG/DOS) 2 MG/3ML ~~LOC~~ SOPN: 0.2500 mg | PEN_INJECTOR | SUBCUTANEOUS | 1 refills | Status: DC

## 2023-12-16 NOTE — Telephone Encounter (Signed)
 Copied from CRM [id number]. Topic: Clinical - Prescription Issue >> Dec 16, 2023  8:41 AM Patsy Lager T wrote: Reason for CRM: patient stated her insurance denied the script for tirzepatide [facility]) 2.5 MG/0.5ML Pen and patient needs to see if there is an alternate medication she can take. Please f/u with patient

## 2023-12-20 ENCOUNTER — Telehealth: Payer: Self-pay

## 2023-12-20 NOTE — Telephone Encounter (Signed)
 I called and notified patient that her ozempic has been approved through 12/2024 pt stated she was able to pick her up prescription over the weekend. YL,RMA

## 2023-12-22 ENCOUNTER — Encounter: Payer: Self-pay | Admitting: Family Medicine

## 2023-12-22 NOTE — Progress Notes (Signed)
 Your A1c is 5.9 and your cholesterol is 122. There is a significant important in both of these. Continue low carb,fat diet with exercise and take all your medications as prescribed.

## 2023-12-22 NOTE — Assessment & Plan Note (Signed)
 Low fat diet advised.

## 2023-12-22 NOTE — Assessment & Plan Note (Signed)
 She is encouraged to strive for BMI less than 30 to decrease cardiac risk. Advised to aim for at least 150 minutes of exercise per week.

## 2024-01-13 ENCOUNTER — Ambulatory Visit: Payer: Self-pay | Admitting: Family Medicine

## 2024-01-13 ENCOUNTER — Ambulatory Visit

## 2024-01-13 VITALS — BP 110/70 | HR 83 | Temp 98.2°F | Ht 66.6 in | Wt 231.0 lb

## 2024-01-13 DIAGNOSIS — Z794 Long term (current) use of insulin: Secondary | ICD-10-CM

## 2024-01-13 NOTE — Progress Notes (Signed)
 Patient presents today for ozempic  teaching. Patient understood instructions provided. First dose given. YL,RMA

## 2024-01-20 ENCOUNTER — Other Ambulatory Visit: Payer: Self-pay

## 2024-01-20 ENCOUNTER — Ambulatory Visit (INDEPENDENT_AMBULATORY_CARE_PROVIDER_SITE_OTHER): Payer: PRIVATE HEALTH INSURANCE | Admitting: Obstetrics and Gynecology

## 2024-01-20 VITALS — BP 110/73 | HR 86 | Wt 224.0 lb

## 2024-01-20 DIAGNOSIS — Z3009 Encounter for other general counseling and advice on contraception: Secondary | ICD-10-CM

## 2024-01-20 MED ORDER — TWIRLA 120-30 MCG/24HR TD PTWK: 1.0000 | MEDICATED_PATCH | TRANSDERMAL | 4 refills | Status: AC

## 2024-01-20 NOTE — Progress Notes (Signed)
 GYNECOLOGY VISIT  Patient name: Linda Barry MRN [id number]  Date of birth: 29-Oct-1990 Chief Complaint:   Gynecologic Exam  History:  Linda Barry is a 33 y.o. [phone] being seen today for contraception management.  Would like to do the patch - which she has done before and did well. Unprotected intercourse last Saturday, menses started 2 days  Seen 10/04/2023 provide JA: Postpartum visit.  Slynd  was started, PA denied.   Discussed the use of AI scribe software for clinical note transcription with the patient, who gave verbal consent to proceed.  History of Present Illness Linda Barry is a 33 year old female who presents for contraceptive management with a preference for the patch.  She prefers the contraceptive patch, which she has used previously and considers her favorite form of birth control. She has tried other methods including Nexplanon and Mirena but prefers the patch due to personal satisfaction. She discontinued the patch previously due to insurance coverage issues, not because of any medical problems.  She was recently diagnosed with diabetes during a hospitalization, marking her first experience with this condition. There is no mention of current medication use for her diabetes or any other conditions.  No history of type 1 diabetes, high blood pressure, migraines, liver problems, blood clots, heart attack, breast cancer, or stroke. She does not use tobacco.    Past Medical History:  Diagnosis Date   Depression    Placenta abruption, delivered, current hospitalization 04/15/2012   Sickle cell trait (HCC)    SVD (spontaneous vaginal delivery) 04/15/2012    Past Surgical History:  Procedure Laterality Date   ORIF ANKLE FRACTURE Left 02/28/2016   Procedure: OPEN REDUCTION INTERNAL FIXATION (ORIF) LEFT ANKLE FRACTURE;  Surgeon: Adonica Hoose, MD;  Location: [facility] OR;  Service: Orthopedics;  Laterality: Left;    The following portions of the patient's history were  reviewed and updated as appropriate: allergies, current medications, past family history, past medical history, past social history, past surgical history and problem list.   Health Maintenance:   Last pap 08/10/2022 NILM Last mammogram: n/a   Review of Systems:  Pertinent items are noted in HPI. Comprehensive review of systems was otherwise negative.   Objective:  Physical Exam BP 110/73   Pulse 86   Wt 224 lb (101.6 kg)   LMP 01/18/2024 (Exact Date)   BMI 35.51 kg/m    Physical Exam Vitals and nursing note reviewed.  Constitutional:      Appearance: Normal appearance.  HENT:     Head: Normocephalic and atraumatic.  Pulmonary:     Effort: Pulmonary effort is normal.  Skin:    General: Skin is warm and dry.  Neurological:     General: No focal deficit present.     Mental Status: She is alert.  Psychiatric:        Mood and Affect: Mood normal.        Behavior: Behavior normal.        Thought Content: Thought content normal.        Judgment: Judgment normal.       Assessment & Plan:   Assessment & Plan Type 2 diabetes mellitus Recently diagnosed without complications or comorbidities. No contraindication for contraceptive patch use. - Prescribed contraceptive patch, sent to preferred pharmacy - Advised immediate start of patch use upon receipt. - Instructed on patch application: one patch weekly, avoid breast area, expect period during patch-free week. - Informed about possibility of skipping period with patch, though safety not well-evidenced. -  Submit for insurance approval if not covered.   Routine preventative health maintenance measures emphasized.  Kiki Pelton, MD [facility] for [facility], [facility]

## 2024-01-24 ENCOUNTER — Telehealth: Payer: Self-pay | Admitting: Lactation Services

## 2024-01-24 NOTE — Telephone Encounter (Signed)
 Linda Barry (Key: [id number]) PA Case ID #: [id number] Need Help? Call us  at [phone] Outcome Additional Information Required We received a prior authorization request for the member and product listed above. The Community and [facility] Prior Authorization Team is not able to review this request because requested medication does not require prior authorization. Please visit [email or web address] to view our most up-to-date Preferred Drug List (PDL), prior authorization forms, and additional pharmacy resources. (Select Menu> Health Plans by State> Select your state> Under Medicaid (Community Plan): Select View Offered Plan Information> Pharmacy Resources & Physician Administered Drugs). In some instances, a pharmacy Clarification Code override may be required to process the claim. The pharmacy may obtain assistance by contacting the [employer] Help Desk at [phone]. [place]  pharmacists, please call [phone].   pharmacists, please call [phone]. [facility]  120-30MCG/24HR weekly patches ePA cloud logo Form [employer] Medicaid Electronic Prior Authorization Form (2017 NCPDP)    Called Pharmacy to discuss PA response, spoke with Pharmacist, Abraham Hoffmann who re-ran Rx and did go through.   Will send message to patient via Mychart.

## 2024-02-17 ENCOUNTER — Ambulatory Visit: Payer: PRIVATE HEALTH INSURANCE | Admitting: Nurse Practitioner

## 2024-02-17 ENCOUNTER — Encounter: Payer: Self-pay | Admitting: Nurse Practitioner

## 2024-02-17 VITALS — BP 112/84 | HR 88 | Temp 97.9°F

## 2024-02-17 DIAGNOSIS — Z789 Other specified health status: Secondary | ICD-10-CM

## 2024-02-17 NOTE — Progress Notes (Signed)
 [facility]  Subjective:  Patient ID: Linda Barry, female    DOB: 05/20/1991  Age: 33 y.o. MRN: [id number]  CC: Wellness Exam  HPI Linda Barry presents for wellness exam visit for insurance benefit.  Patient has a PCP: Melodie Spry NP PMH significant for: Diabetes Last labs per PCP were completed: April 2025  Health Maintenance:  Colonoscopy: no family hx of colon cancer Mammogram: no family hx of beast cancer Pap: completed June 2023    Smoker: former  Immunizations:  Tdap: August 2024  Flu-declines yearly  Lifestyle: Diet- recently started ozempic . Some nausea with this.  Exercise- does exercise videos 2x a week. Has a mini stepper she uses occasionally.     Past Medical History:  Diagnosis Date   Depression    Placenta abruption, delivered, current hospitalization 04/15/2012   Sickle cell trait (HCC)    SVD (spontaneous vaginal delivery) 04/15/2012    Past Surgical History:  Procedure Laterality Date   ORIF ANKLE FRACTURE Left 02/28/2016   Procedure: OPEN REDUCTION INTERNAL FIXATION (ORIF) LEFT ANKLE FRACTURE;  Surgeon: Adonica Hoose, MD;  Location: [facility] OR;  Service: Orthopedics;  Laterality: Left;    Outpatient Medications Prior to Visit  Medication Sig Dispense Refill   Blood Glucose Monitoring Suppl DEVI 1 each by Does not apply route 3 (three) times daily. May dispense any manufacturer covered by patient's insurance. 1 each 0   Glucose Blood (BLOOD GLUCOSE TEST STRIPS) STRP 1 each by Does not apply route 3 (three) times daily. Use as directed to check blood sugar. May dispense any manufacturer covered by patient's insurance and fits patient's device. 100 strip 3   ibuprofen  (ADVIL ) 600 MG tablet Take 1 tablet (600 mg total) by mouth every 6 (six) hours. 30 tablet 0   Lancet Device MISC 1 each by Does not apply route 3 (three) times daily. May dispense any manufacturer covered by patient's insurance. 1 each 3   Levonorgestrel-Eth Estradiol  (TWIRLA ) 120-30 MCG/24HR PTWK Place 1 patch onto the skin once a week. 12 patch 4   metFORMIN  (GLUCOPHAGE ) 500 MG tablet Take 1 tablet (500 mg total) by mouth 2 (two) times daily at 8 am and 10 pm. 60 tablet 1   Semaglutide ,0.25 or 0.5MG /DOS, 2 MG/3ML SOPN Inject 0.25 mg into the skin once a week. 3 mL 1   insulin  aspart (NOVOLOG ) 100 UNIT/ML injection Inject 10 Units into the skin 2 (two) times daily before a meal. (Patient not taking: Reported on 01/20/2024) 10 mL 11   Insulin  Pen Needle 31G X 5 MM MISC Please dispense insulin  syringes to draw insulin  out of vial 14 each 0   Insulin  Syringe-Needle U-100 (SAFETY INSULIN  SYRINGES) 30G X 5/16" 0.5 ML MISC 1 Syringe by Does not apply route 2 (two) times daily. 100 each 3   Lancets MISC 1 each by Does not apply route 3 (three) times daily. Use as directed to check blood sugar. May dispense any manufacturer covered by patient's insurance and fits patient's device. 100 each 0   No facility-administered medications prior to visit.    ROS Review of Systems  Constitutional:  Positive for appetite change (secondary to ozempic .).  HENT:  Negative for hearing loss.   Eyes:  Negative for visual disturbance.  Respiratory:  Negative for shortness of breath.   Cardiovascular:  Negative for leg swelling.  Gastrointestinal:  Positive for nausea. Negative for constipation.  Musculoskeletal:  Negative for arthralgias and back pain.  Neurological:  Negative for dizziness and  headaches.    Objective:  BP 112/84 (BP Location: Left Arm, Patient Position: Sitting, Cuff Size: Normal)   Pulse 88   Temp 97.9 F (36.6 C)   LMP 02/07/2024 (Approximate)   SpO2 98%   Breastfeeding No   Physical Exam Constitutional:      General: She is not in acute distress. HENT:     Head: Normocephalic.     Right Ear: Tympanic membrane, ear canal and external ear normal.     Left Ear: There is impacted cerumen.     Nose: Nose normal.     Mouth/Throat:     Mouth: Mucous  membranes are moist.  Eyes:     Pupils: Pupils are equal, round, and reactive to light.  Cardiovascular:     Rate and Rhythm: Normal rate and regular rhythm.     Heart sounds: Normal heart sounds.  Pulmonary:     Effort: Pulmonary effort is normal.     Breath sounds: Normal breath sounds.  Musculoskeletal:        General: Normal range of motion.     Right lower leg: No edema.     Left lower leg: No edema.  Skin:    General: Skin is warm.  Neurological:     General: No focal deficit present.     Mental Status: She is alert and oriented to person, place, and time.  Psychiatric:        Mood and Affect: Mood normal.     Assessment & Plan:    Linda Barry was seen today for wellness exam.  Diagnoses and all orders for this visit:  Participant in health and wellness plan Adult wellness physical was conducted today. Importance of diet and exercise were discussed in detail.  We reviewed immunizations and gave recommendations regarding current immunization needed for age.  Preventative health exams are up to date.   Patient was advised yearly wellness exam    No orders of the defined types were placed in this encounter.   No orders of the defined types were placed in this encounter.   Follow-up: as needed.

## 2024-03-15 ENCOUNTER — Encounter: Payer: Self-pay | Admitting: Family Medicine

## 2024-03-15 ENCOUNTER — Ambulatory Visit: Admitting: Family Medicine

## 2024-03-15 VITALS — BP 120/68 | HR 82 | Temp 98.2°F | Ht 66.6 in | Wt 227.0 lb

## 2024-03-15 DIAGNOSIS — E66812 Obesity, class 2: Secondary | ICD-10-CM

## 2024-03-15 DIAGNOSIS — E1165 Type 2 diabetes mellitus with hyperglycemia: Secondary | ICD-10-CM

## 2024-03-15 DIAGNOSIS — D573 Sickle-cell trait: Secondary | ICD-10-CM

## 2024-03-15 DIAGNOSIS — E559 Vitamin D deficiency, unspecified: Secondary | ICD-10-CM | POA: Diagnosis not present

## 2024-03-15 DIAGNOSIS — Z6835 Body mass index (BMI) 35.0-35.9, adult: Secondary | ICD-10-CM

## 2024-03-15 MED ORDER — METFORMIN HCL 500 MG PO TABS
500.0000 mg | ORAL_TABLET | Freq: Every day | ORAL | 3 refills | Status: DC
Start: 2024-03-15 — End: 2024-07-19

## 2024-03-15 MED ORDER — SEMAGLUTIDE(0.25 OR 0.5MG/DOS) 2 MG/3ML ~~LOC~~ SOPN: 0.5000 mg | PEN_INJECTOR | SUBCUTANEOUS | 2 refills | Status: DC

## 2024-03-15 NOTE — Patient Instructions (Signed)

## 2024-03-15 NOTE — Progress Notes (Signed)
 I,Tianna Badgett,acting as a Neurosurgeon for [employer], NP.,have documented all relevant documentation on the behalf of Bruna Creighton, NP,as directed by  Bruna Creighton, NP while in the presence of Bruna Creighton, NP.  Subjective:  Patient ID: Linda Barry , female    DOB: [birth year] , 33 y.o.   MRN: [id number]  Chief Complaint  Patient presents with   Diabetes Mellitus    Patient presents today for DM follow up. She has no concerns at this time.     HPI  Patient is 33 year old female who comes in for diabetes management.She states that she has been compliant with taking her Ozempic  once weekly injection and Metformin  500 mg every day . She denies any side effects and she has no concerns or questions today.     Past Medical History:  Diagnosis Date   Depression    Placenta abruption, delivered, current hospitalization 04/15/2012   Sickle cell trait (HCC)    SVD (spontaneous vaginal delivery) 04/15/2012     Family History  Problem Relation Age of Onset   Diabetes Maternal Grandmother    Other Neg Hx      Current Outpatient Medications:    Blood Glucose Monitoring Suppl DEVI, 1 each by Does not apply route 3 (three) times daily. May dispense any manufacturer covered by patient's insurance., Disp: 1 each, Rfl: 0   Glucose Blood (BLOOD GLUCOSE TEST STRIPS) STRP, 1 each by Does not apply route 3 (three) times daily. Use as directed to check blood sugar. May dispense any manufacturer covered by patient's insurance and fits patient's device., Disp: 100 strip, Rfl: 3   ibuprofen  (ADVIL ) 600 MG tablet, Take 1 tablet (600 mg total) by mouth every 6 (six) hours., Disp: 30 tablet, Rfl: 0   Lancet Device MISC, 1 each by Does not apply route 3 (three) times daily. May dispense any manufacturer covered by patient's insurance., Disp: 1 each, Rfl: 3   Levonorgestrel-Eth Estradiol (TWIRLA ) 120-30 MCG/24HR PTWK, Place 1 patch onto the skin once a week., Disp: 12 patch, Rfl: 4   metFORMIN  (GLUCOPHAGE ) 500 MG  tablet, Take 1 tablet (500 mg total) by mouth daily with breakfast., Disp: 90 tablet, Rfl: 3   Semaglutide ,0.25 or 0.5MG /DOS, 2 MG/3ML SOPN, Inject 0.5 mg into the skin once a week., Disp: 3 mL, Rfl: 2   Allergies  Allergen Reactions   Latex Other (See Comments)    Gloves irritate eczema on hands, no problems elsewhere per patient     Review of Systems  Constitutional: Negative.   HENT: Negative.    Respiratory: Negative.    Cardiovascular: Negative.   Gastrointestinal: Negative.   Endocrine: Negative for polydipsia, polyphagia and polyuria.  Musculoskeletal: Negative.   Skin: Negative.   Psychiatric/Behavioral: Negative.       Today's Vitals   03/15/24 1017  BP: 120/68  Pulse: 82  Temp: 98.2 F (36.8 C)  TempSrc: Oral  SpO2: 98%  Weight: 227 lb (103 kg)  Height: 5' 6.6 (1.692 m)   Body mass index is 35.98 kg/m.  Wt Readings from Last 3 Encounters:  03/15/24 227 lb (103 kg)  01/20/24 224 lb (101.6 kg)  01/13/24 231 lb (104.8 kg)    The ASCVD Risk score (Arnett DK, et al., 2019) failed to calculate for the following reasons:   The 2019 ASCVD risk score is only valid for ages 1 to 38  Objective:  Physical Exam HENT:     Head: Normocephalic.  Cardiovascular:     Rate and  Rhythm: Normal rate and regular rhythm.  Pulmonary:     Effort: Pulmonary effort is normal.     Breath sounds: Normal breath sounds.  Neurological:     General: No focal deficit present.     Mental Status: She is alert and oriented to person, place, and time.  Psychiatric:        Mood and Affect: Mood normal.        Behavior: Behavior normal.         Assessment And Plan:  Type 2 diabetes mellitus with hyperglycemia, without long-term current use of insulin  (HCC) -     BMP8+EGFR -     Hemoglobin A1c -     metFORMIN  HCl; Take 1 tablet (500 mg total) by mouth daily with breakfast.  Dispense: 90 tablet; Refill: 3 -     Semaglutide (0.25 or 0.5MG /DOS); Inject 0.5 mg into the skin once a  week.  Dispense: 3 mL; Refill: 2 -     CBC with Differential/Platelet  Vitamin D  deficiency -     VITAMIN D  25 Hydroxy (Vit-D Deficiency, Fractures)  Sickle cell trait (HCC) Assessment & Plan: Chronic.stable   Class 2 severe obesity due to excess calories with serious comorbidity and body mass index (BMI) of 35.0 to 35.9 in adult [facility]) Assessment & Plan: She is encouraged to strive for BMI less than 30 to decrease cardiac risk. Advised to aim for at least 150 minutes of exercise per week.      Return in about 4 months (around 07/16/2024) for diabetes, physical.  Patient was given opportunity to ask questions. Patient verbalized understanding of the plan and was able to repeat key elements of the plan. All questions were answered to their satisfaction.    I, Bruna Creighton, NP, have reviewed all documentation for this visit. The documentation on 03/27/2024 for the exam, diagnosis, procedures, and orders are all accurate and complete.     IF YOU HAVE BEEN REFERRED TO A SPECIALIST, IT MAY TAKE 1-2 WEEKS TO SCHEDULE/PROCESS THE REFERRAL. IF YOU HAVE NOT HEARD FROM US /SPECIALIST IN TWO WEEKS, PLEASE GIVE US  A CALL AT [phone] X [phone].

## 2024-03-16 LAB — BMP8+EGFR
BUN/Creatinine Ratio: 14 (ref 9–23)
BUN: 10 mg/dL (ref 6–20)
CO2: 19 mmol/L — ABNORMAL LOW (ref 20–29)
Calcium: 9.6 mg/dL (ref 8.7–10.2)
Chloride: 102 mmol/L (ref 96–106)
Creatinine, Ser: 0.73 mg/dL (ref 0.57–1.00)
Glucose: 81 mg/dL (ref 70–99)
Potassium: 4.8 mmol/L (ref 3.5–5.2)
Sodium: 137 mmol/L (ref 134–144)
eGFR: 113 mL/min/{1.73_m2} (ref >59)

## 2024-03-16 LAB — CBC WITH DIFFERENTIAL/PLATELET
Basophils Absolute: 0.1 10*3/uL (ref 0.0–0.2)
Basos: 1 %
EOS (ABSOLUTE): 0.2 10*3/uL (ref 0.0–0.4)
Eos: 3 %
Hematocrit: 40.8 % (ref 34.0–46.6)
Hemoglobin: 12.4 g/dL (ref 11.1–15.9)
Immature Grans (Abs): 0.1 10*3/uL (ref 0.0–0.1)
Immature Granulocytes: 1 %
Lymphocytes Absolute: 3.3 10*3/uL — ABNORMAL HIGH (ref 0.7–3.1)
Lymphs: 39 %
MCH: 24.5 pg — ABNORMAL LOW (ref 26.6–33.0)
MCHC: 30.4 g/dL — ABNORMAL LOW (ref 31.5–35.7)
MCV: 81 fL (ref 79–97)
Monocytes Absolute: 0.5 10*3/uL (ref 0.1–0.9)
Monocytes: 6 %
Neutrophils Absolute: 4.3 10*3/uL (ref 1.4–7.0)
Neutrophils: 50 %
Platelets: 202 10*3/uL (ref 150–450)
RBC: 5.06 x10E6/uL (ref 3.77–5.28)
RDW: 14.4 % (ref 11.7–15.4)
WBC: 8.5 10*3/uL (ref 3.4–10.8)

## 2024-03-16 LAB — HEMOGLOBIN A1C
Est. average glucose Bld gHb Est-mCnc: 108 mg/dL
Hgb A1c MFr Bld: 5.4 % (ref 4.8–5.6)

## 2024-03-16 LAB — VITAMIN D 25 HYDROXY (VIT D DEFICIENCY, FRACTURES): Vit D, 25-Hydroxy: 20 ng/mL — ABNORMAL LOW (ref 30.0–100.0)

## 2024-03-27 ENCOUNTER — Ambulatory Visit: Payer: Self-pay | Admitting: Family Medicine

## 2024-03-27 MED ORDER — VITAMIN D (ERGOCALCIFEROL) 1.25 MG (50000 UNIT) PO CAPS: 50000.0000 [IU] | ORAL_CAPSULE | ORAL | 1 refills | Status: DC

## 2024-03-27 NOTE — Progress Notes (Signed)
 Vitamin D  level is low.  Once weekly vitamin D  supplement has been sent to the pharmacy.  Your A1c is 5.4, continue taking your medicine and keep a low-carb diet.  Will reevaluate at your next appointment.  Thank you

## 2024-03-27 NOTE — Assessment & Plan Note (Signed)
 Chronic stable.

## 2024-03-27 NOTE — Assessment & Plan Note (Signed)
 She is encouraged to strive for BMI less than 30 to decrease cardiac risk. Advised to aim for at least 150 minutes of exercise per week.

## 2024-05-19 ENCOUNTER — Other Ambulatory Visit: Payer: Self-pay | Admitting: Family Medicine

## 2024-05-19 DIAGNOSIS — E1165 Type 2 diabetes mellitus with hyperglycemia: Secondary | ICD-10-CM

## 2024-05-19 NOTE — Telephone Encounter (Signed)
 Copied from CRM [id number]. Topic: Clinical - Medication Refill >> May 19, 2024  9:32 AM Myrick T wrote: Medication: Semaglutide , 0.5MG /DOS  Has the patient contacted their pharmacy? No  This is the patient's preferred pharmacy:  [id number] - [place], [place] - [address] AT [facility] [address] & [address] [place] [address] Phone: [phone] Fax: [phone]  Is this the correct pharmacy for this prescription? Yes  Has the prescription been filled recently? Yes  Is the patient out of the medication? No  Has the patient been seen for an appointment in the last year OR does the patient have an upcoming appointment? Yes  Can we respond through MyChart? Yes  Agent: Please be advised that Rx refills may take up to 3 business days. We ask that you follow-up with your pharmacy.

## 2024-05-22 MED ORDER — SEMAGLUTIDE(0.25 OR 0.5MG/DOS) 2 MG/3ML ~~LOC~~ SOPN: 0.5000 mg | PEN_INJECTOR | SUBCUTANEOUS | 2 refills | Status: DC

## 2024-05-24 ENCOUNTER — Ambulatory Visit (INDEPENDENT_AMBULATORY_CARE_PROVIDER_SITE_OTHER): Payer: PRIVATE HEALTH INSURANCE | Admitting: Family Medicine

## 2024-05-24 ENCOUNTER — Encounter: Payer: Self-pay | Admitting: Family Medicine

## 2024-05-24 VITALS — BP 112/78 | HR 107 | Temp 99.0°F | Ht 66.6 in | Wt 219.0 lb

## 2024-05-24 DIAGNOSIS — E1165 Type 2 diabetes mellitus with hyperglycemia: Secondary | ICD-10-CM

## 2024-05-24 DIAGNOSIS — Z6835 Body mass index (BMI) 35.0-35.9, adult: Secondary | ICD-10-CM | POA: Diagnosis not present

## 2024-05-24 DIAGNOSIS — J3489 Other specified disorders of nose and nasal sinuses: Secondary | ICD-10-CM

## 2024-05-24 DIAGNOSIS — E66812 Obesity, class 2: Secondary | ICD-10-CM

## 2024-05-24 DIAGNOSIS — R0989 Other specified symptoms and signs involving the circulatory and respiratory systems: Secondary | ICD-10-CM

## 2024-05-24 LAB — POC COVID19 BINAXNOW: SARS Coronavirus 2 Ag: NEGATIVE

## 2024-05-24 NOTE — Progress Notes (Signed)
 I,[name], CMA,acting as a Neurosurgeon for [employer], NP.,have documented all relevant documentation on the behalf of Bruna Creighton, NP,as directed by  Bruna Creighton, NP while in the presence of Bruna Creighton, NP.  Subjective:  Patient ID: Linda Barry , female    DOB: 1991-03-30 , 33 y.o.   MRN: [id number]  Chief Complaint  Patient presents with   URI    Patient presents today for cold symptoms. She has a runny nose, chest congestion, sinus pain and pressure. Her symptoms started yesterday. She reports her baby was recently sick.     HPI Discussed the use of AI scribe software for clinical note transcription with the patient, who gave verbal consent to proceed.  History of Present Illness     Linda Barry is a 33 year old female who presents with cough and sinus pressure.  Symptoms began yesterday, including cough, sinus pressure, runny nose with clear discharge, congestion, and facial fullness. She also experiences mild body aches. No fever is present.  She has not taken any medications for these symptoms yet. Her current medications include Ozempic  and metformin  for diabetes, along with a vitamin supplement. Patient denies any fever and states that she has not been around anyone who is sick  Patient states that she has been compliant with taking her medications.     Past Medical History:  Diagnosis Date   Depression    Placenta abruption, delivered, current hospitalization 04/15/2012   Sickle cell trait (HCC)    SVD (spontaneous vaginal delivery) 04/15/2012     Family History  Problem Relation Age of Onset   Diabetes Maternal Grandmother    Other Neg Hx      Current Outpatient Medications:    Blood Glucose Monitoring Suppl DEVI, 1 each by Does not apply route 3 (three) times daily. May dispense any manufacturer covered by patient's insurance., Disp: 1 each, Rfl: 0   Glucose Blood (BLOOD GLUCOSE TEST STRIPS) STRP, 1 each by Does not apply route 3 (three) times daily. Use  as directed to check blood sugar. May dispense any manufacturer covered by patient's insurance and fits patient's device., Disp: 100 strip, Rfl: 3   ibuprofen  (ADVIL ) 600 MG tablet, Take 1 tablet (600 mg total) by mouth every 6 (six) hours., Disp: 30 tablet, Rfl: 0   Lancet Device MISC, 1 each by Does not apply route 3 (three) times daily. May dispense any manufacturer covered by patient's insurance., Disp: 1 each, Rfl: 3   Levonorgestrel-Eth Estradiol (TWIRLA ) 120-30 MCG/24HR PTWK, Place 1 patch onto the skin once a week., Disp: 12 patch, Rfl: 4   metFORMIN  (GLUCOPHAGE ) 500 MG tablet, Take 1 tablet (500 mg total) by mouth daily with breakfast., Disp: 90 tablet, Rfl: 3   Semaglutide ,0.25 or 0.5MG /DOS, 2 MG/3ML SOPN, Inject 0.5 mg into the skin once a week., Disp: 3 mL, Rfl: 2   Vitamin D , Ergocalciferol , (DRISDOL ) 1.25 MG (50000 UNIT) CAPS capsule, Take 1 capsule (50,000 Units total) by mouth every 7 (seven) days., Disp: 12 capsule, Rfl: 1   Allergies  Allergen Reactions   Latex Other (See Comments)    Gloves irritate eczema on hands, no problems elsewhere per patient     Review of Systems  Constitutional: Negative.   HENT:  Positive for congestion and sinus pain.   Respiratory:  Positive for cough. Negative for shortness of breath.   Cardiovascular: Negative.   Endocrine: Negative for polydipsia, polyphagia and polyuria.  Musculoskeletal: Negative.   Psychiatric/Behavioral: Negative.  Today's Vitals   05/24/24 1201  BP: 112/78  Pulse: (!) 107  Temp: 99 F (37.2 C)  TempSrc: Oral  Weight: 219 lb (99.3 kg)  Height: 5' 6.6 (1.692 m)  PainSc: 0-No pain   Body mass index is 34.71 kg/m.  Wt Readings from Last 3 Encounters:  05/24/24 219 lb (99.3 kg)  03/15/24 227 lb (103 kg)  01/20/24 224 lb (101.6 kg)    The ASCVD Risk score (Arnett DK, et al., 2019) failed to calculate for the following reasons:   The 2019 ASCVD risk score is only valid for ages 11 to 58  Objective:   Physical Exam HENT:     Head: Normocephalic.     Nose:     Right Sinus: Frontal sinus tenderness present.     Left Sinus: Frontal sinus tenderness present.  Cardiovascular:     Rate and Rhythm: Tachycardia present.  Pulmonary:     Effort: Pulmonary effort is normal.     Breath sounds: Normal breath sounds. No wheezing or rhonchi.  Neurological:     Mental Status: She is alert.         Assessment And Plan:  Runny nose Assessment & Plan: Gave samples of Norel AD.  Orders: -     POC COVID-19 BinaxNow  Type 2 diabetes mellitus with hyperglycemia, without long-term current use of insulin  (HCC) -     Ambulatory referral to Ophthalmology  Class 2 severe obesity due to excess calories with serious comorbidity and body mass index (BMI) of 35.0 to 35.9 in adult [facility] Assessment & Plan: She is encouraged to strive for BMI less than 30 to decrease cardiac risk. Advised to aim for at least 150 minutes of exercise per week.      Assessment & Plan Gave samples of Norel AD   Acute upper respiratory infection with sinus symptoms Acute onset of rhinorrhea, sinus pressure, and mild cough suggestive of viral infection. - Provided Neoral sample for cough and congestion relief.  Type 2 diabetes mellitus with hyperglycemia Type 2 diabetes managed with Ozempic  and metformin . - Referred to eye doctor for general eye examination.   Return if symptoms worsen or fail to improve, for keep appt.  Patient was given opportunity to ask questions. Patient verbalized understanding of the plan and was able to repeat key elements of the plan. All questions were answered to their satisfaction.   I, Bruna Creighton, NP, have reviewed all documentation for this visit. The documentation on 05/31/2024 for the exam, diagnosis, procedures, and orders are all accurate and complete.     IF YOU HAVE BEEN REFERRED TO A SPECIALIST, IT MAY TAKE 1-2 WEEKS TO SCHEDULE/PROCESS THE REFERRAL. IF YOU HAVE NOT HEARD FROM  US /SPECIALIST IN TWO WEEKS, PLEASE GIVE US  A CALL AT [phone].

## 2024-05-31 DIAGNOSIS — J3489 Other specified disorders of nose and nasal sinuses: Secondary | ICD-10-CM | POA: Insufficient documentation

## 2024-05-31 DIAGNOSIS — R0989 Other specified symptoms and signs involving the circulatory and respiratory systems: Secondary | ICD-10-CM | POA: Insufficient documentation

## 2024-05-31 NOTE — Assessment & Plan Note (Signed)
 She is encouraged to strive for BMI less than 30 to decrease cardiac risk. Advised to aim for at least 150 minutes of exercise per week.

## 2024-05-31 NOTE — Assessment & Plan Note (Signed)
 Gave samples of Norel AD

## 2024-07-19 ENCOUNTER — Ambulatory Visit: Payer: Self-pay | Admitting: Family Medicine

## 2024-07-19 ENCOUNTER — Encounter: Payer: Self-pay | Admitting: Family Medicine

## 2024-07-19 VITALS — BP 110/60 | HR 68 | Temp 98.2°F | Ht 66.6 in | Wt 210.0 lb

## 2024-07-19 DIAGNOSIS — E7849 Other hyperlipidemia: Secondary | ICD-10-CM | POA: Diagnosis not present

## 2024-07-19 DIAGNOSIS — E6609 Other obesity due to excess calories: Secondary | ICD-10-CM | POA: Diagnosis not present

## 2024-07-19 DIAGNOSIS — Z2821 Immunization not carried out because of patient refusal: Secondary | ICD-10-CM | POA: Diagnosis not present

## 2024-07-19 DIAGNOSIS — E559 Vitamin D deficiency, unspecified: Secondary | ICD-10-CM

## 2024-07-19 DIAGNOSIS — E66811 Obesity, class 1: Secondary | ICD-10-CM | POA: Diagnosis not present

## 2024-07-19 DIAGNOSIS — Z0000 Encounter for general adult medical examination without abnormal findings: Secondary | ICD-10-CM

## 2024-07-19 DIAGNOSIS — D573 Sickle-cell trait: Secondary | ICD-10-CM

## 2024-07-19 DIAGNOSIS — R2231 Localized swelling, mass and lump, right upper limb: Secondary | ICD-10-CM | POA: Diagnosis not present

## 2024-07-19 DIAGNOSIS — Z6833 Body mass index (BMI) 33.0-33.9, adult: Secondary | ICD-10-CM | POA: Diagnosis not present

## 2024-07-19 DIAGNOSIS — E1165 Type 2 diabetes mellitus with hyperglycemia: Secondary | ICD-10-CM

## 2024-07-19 MED ORDER — INSULIN PEN NEEDLE 32G X 4 MM MISC: 1 refills | Status: DC

## 2024-07-19 MED ORDER — LANCET DEVICE MISC: 1.0000 | Freq: Three times a day (TID) | 3 refills | Status: AC

## 2024-07-19 MED ORDER — SEMAGLUTIDE(0.25 OR 0.5MG/DOS) 2 MG/3ML ~~LOC~~ SOPN: 0.5000 mg | PEN_INJECTOR | SUBCUTANEOUS | 2 refills | Status: AC

## 2024-07-19 MED ORDER — METFORMIN HCL 500 MG PO TABS: 500.0000 mg | ORAL_TABLET | Freq: Every day | ORAL | 3 refills | Status: AC

## 2024-07-19 MED ORDER — BLOOD GLUCOSE TEST VI STRP: 1.0000 | ORAL_STRIP | Freq: Three times a day (TID) | 3 refills | Status: AC

## 2024-07-19 NOTE — Assessment & Plan Note (Signed)
-  Type 2 diabetes managed with metformin  and semaglutide .  -Reports 20-pound weight loss since starting semaglutide .  -Issue with blood glucose monitoring supplies; refill for lancet needles needed.

## 2024-07-19 NOTE — Assessment & Plan Note (Signed)
 Chronic.Stable.No symptoms of crisis noted.

## 2024-07-19 NOTE — Patient Instructions (Signed)

## 2024-07-19 NOTE — Assessment & Plan Note (Addendum)
-  Reports 20-pound weight loss since starting semaglutide .  She is encouraged to strive for BMI less than 30 to decrease cardiac risk. Advised to aim for at least 150 minutes of exercise per week.

## 2024-07-19 NOTE — Progress Notes (Signed)
 I,[name], CMA,acting as a neurosurgeon for [employer], NP.,have documented all relevant documentation on the behalf of Bruna Creighton, NP,as directed by  Bruna Creighton, NP while in the presence of Bruna Creighton, NP.  Subjective:    Patient ID: Linda Barry , female    DOB: Jan 19, 1991 , 33 y.o.   MRN: [id number]  Chief Complaint  Patient presents with   Annual Exam    Patient presents today for a physical. Patient reports compliance with her meds. Patient is followed by Dr.Ajewole for her GYN care. Patient denies having chest pain,sob or headaches at this time. Patient reports she has a lump on her arm that she would like for you to look at.     HPI Discussed the use of AI scribe software for clinical note transcription with the patient, who gave verbal consent to proceed.  History of Present Illness      Linda Barry is a 33 year old female who presents for an annual physical exam.  She feels much better compared to her last visit two to three months ago when she was experiencing congestion and was coming down with something.  She is currently managing type 2 diabetes with insulin , Ozempic , and metformin . She takes a vitamin D  supplement once a week and has been on Ozempic  since April or May, resulting in a weight loss of at least twenty pounds. She also uses a birth control patch, which she started around May 8th.  She has noticed a lump in her right upper arm for over a year. It is not painful, warm, or tender, and has been present since before starting Ozempic  or the patch. She recalls having a Nexplanon implant in the same arm about eight to ten years ago, which was removed after three years.  Her bowel movements are regular. No pain, discomfort, or shortness of breath. She has a young child who will be a year old on the 26th.      Past Medical History:  Diagnosis Date   Depression    Placenta abruption, delivered, current hospitalization 04/15/2012   Sickle cell trait    SVD  (spontaneous vaginal delivery) 04/15/2012     Family History  Problem Relation Age of Onset   Diabetes Maternal Grandmother    Other Neg Hx      Current Outpatient Medications:    Blood Glucose Monitoring Suppl DEVI, 1 each by Does not apply route 3 (three) times daily. May dispense any manufacturer covered by patient's insurance., Disp: 1 each, Rfl: 0   ibuprofen  (ADVIL ) 600 MG tablet, Take 1 tablet (600 mg total) by mouth every 6 (six) hours., Disp: 30 tablet, Rfl: 0   Insulin  Pen Needle 32G X 4 MM MISC, Use as directed, Disp: 50 each, Rfl: 1   Levonorgestrel-Eth Estradiol (TWIRLA ) 120-30 MCG/24HR PTWK, Place 1 patch onto the skin once a week., Disp: 12 patch, Rfl: 4   Vitamin D , Ergocalciferol , (DRISDOL ) 1.25 MG (50000 UNIT) CAPS capsule, Take 1 capsule (50,000 Units total) by mouth every 7 (seven) days., Disp: 12 capsule, Rfl: 1   Glucose Blood (BLOOD GLUCOSE TEST STRIPS) STRP, 1 each by Does not apply route 3 (three) times daily. Use as directed to check blood sugar. May dispense any manufacturer covered by patient's insurance and fits patient's device., Disp: 100 strip, Rfl: 3   Lancet Device MISC, 1 each by Does not apply route 3 (three) times daily. May dispense any manufacturer covered by patient's insurance., Disp: 1 each, Rfl: 3  metFORMIN  (GLUCOPHAGE ) 500 MG tablet, Take 1 tablet (500 mg total) by mouth daily with breakfast., Disp: 90 tablet, Rfl: 3   Semaglutide ,0.25 or 0.5MG /DOS, 2 MG/3ML SOPN, Inject 0.5 mg into the skin once a week., Disp: 3 mL, Rfl: 2   Allergies  Allergen Reactions   Latex Other (See Comments)    Gloves irritate eczema on hands, no problems elsewhere per patient       Social History   Tobacco Use  Smoking Status Former   Current packs/day: 0.00   Types: Cigarettes   Quit date: 02/01/2012   Years since quitting: 12.4   Passive exposure: Never  Smokeless Tobacco Never  . She has been exposed to passive smoke. The patient's alcohol  use is:  Social  History   Substance and Sexual Activity  Alcohol  Use No    Review of Systems  Constitutional: Negative.   HENT: Negative.    Eyes: Negative.   Respiratory: Negative.    Cardiovascular: Negative.   Gastrointestinal: Negative.   Endocrine: Negative.   Genitourinary: Negative.   Musculoskeletal: Negative.   Skin: Negative.   Allergic/Immunologic: Negative.   Neurological: Negative.   Hematological: Negative.  Negative for adenopathy. Does not bruise/bleed easily.  Psychiatric/Behavioral: Negative.       Today's Vitals   07/19/24 0942  BP: 110/60  Pulse: 68  Temp: 98.2 F (36.8 C)  TempSrc: Oral  Weight: 210 lb (95.3 kg)  Height: 5' 6.6 (1.692 m)  PainSc: 0-No pain   Body mass index is 33.29 kg/m.  Wt Readings from Last 3 Encounters:  07/19/24 210 lb (95.3 kg)  05/24/24 219 lb (99.3 kg)  03/15/24 227 lb (103 kg)     Objective:  Physical Exam Constitutional:      Appearance: Normal appearance.  HENT:     Head: Normocephalic.     Nose: Nose normal.  Cardiovascular:     Rate and Rhythm: Normal rate and regular rhythm.     Pulses: Normal pulses.     Heart sounds: Normal heart sounds.  Pulmonary:     Effort: Pulmonary effort is normal.     Breath sounds: Normal breath sounds.  Abdominal:     General: Bowel sounds are normal.  Musculoskeletal:        General: Normal range of motion.  Skin:    General: Skin is warm and dry.  Neurological:     General: No focal deficit present.     Mental Status: She is alert and oriented to person, place, and time. Mental status is at baseline.  Psychiatric:        Mood and Affect: Mood normal.         Assessment And Plan:     Encounter for general adult medical examination w/o abnormal findings  Type 2 diabetes mellitus with hyperglycemia, without long-term current use of insulin  (HCC) Assessment & Plan: -Type 2 diabetes managed with metformin  and semaglutide .  -Reports 20-pound weight loss since starting  semaglutide .  -Issue with blood glucose monitoring supplies; refill for lancet needles needed.  Orders: -     EKG 12-Lead -     CBC -     CMP14+EGFR -     Hemoglobin A1c -     Blood Glucose Test; 1 each by Does not apply route 3 (three) times daily. Use as directed to check blood sugar. May dispense any manufacturer covered by patient's insurance and fits patient's device.  Dispense: 100 strip; Refill: 3 -     Lancet Device;  1 each by Does not apply route 3 (three) times daily. May dispense any manufacturer covered by patient's insurance.  Dispense: 1 each; Refill: 3 -     metFORMIN  HCl; Take 1 tablet (500 mg total) by mouth daily with breakfast.  Dispense: 90 tablet; Refill: 3 -     Semaglutide (0.25 or 0.5MG /DOS); Inject 0.5 mg into the skin once a week.  Dispense: 3 mL; Refill: 2 -     Insulin  Pen Needle; Use as directed  Dispense: 50 each; Refill: 1  Hyperlipidemia due to dietary fat intake Assessment & Plan: Plan to check cholesterol levels as part of routine management.  Orders: -     Lipid panel  Sickle cell trait Assessment & Plan: Chronic.Stable.No symptoms of crisis noted.   Vitamin D  deficiency -     VITAMIN D  25 Hydroxy (Vit-D Deficiency, Fractures)  Influenza vaccination declined  Lump of skin of upper extremity, right Assessment & Plan: -Right upper arm lump present , non-tender, non-warm, non-red.  -Concern for possible DVT due to birth control use.  - Ordered Doppler ultrasound to evaluate for DVT.  Orders: -     VAS US  UPPER EXTREMITY VENOUS DUPLEX; Future  Class 1 obesity due to excess calories with serious comorbidity and body mass index (BMI) of 33.0 to 33.9 in adult Assessment & Plan: -Reports 20-pound weight loss since starting semaglutide .  She is encouraged to strive for BMI less than 30 to decrease cardiac risk. Advised to aim for at least 150 minutes of exercise per week.       Assessment & Plan Adult Wellness Visit Visit to address ongoing  health concerns including diabetes, obesity, hyperlipidemia, vitamin D  deficiency, and evaluation of right upper arm lump. - Scheduled next physical in one year.  Type 2 diabetes mellitus with hyperglycemia - Ensured availability of lancet needles for blood glucose monitoring.  Obesity due to excess calories Hyperlipidemia Plan to check cholesterol levels as part of routine management.  Vitamin D  deficiency Managed with weekly ergocalciferol  supplementation.  Right upper arm lump, evaluation for possible deep vein thrombosis (DVT) Right upper arm lump present for over a year, non-tender, non-warm, non-red. Concern for possible DVT due to birth control use. Doppler ultrasound ordered to rule out DVT. - Ordered Doppler ultrasound to evaluate for DVT.   Return for 1 year physical, 4 months. Patient was given opportunity to ask questions. Patient verbalized understanding of the plan and was able to repeat key elements of the plan. All questions were answered to their satisfaction.  I, Bruna Creighton, NP, have reviewed all documentation for this visit. The documentation on 07/19/2024 for the exam, diagnosis, procedures, and orders are all accurate and complete.

## 2024-07-19 NOTE — Assessment & Plan Note (Signed)
 Plan to check cholesterol levels as part of routine management.

## 2024-07-19 NOTE — Assessment & Plan Note (Signed)
-  Right upper arm lump present , non-tender, non-warm, non-red.  -Concern for possible DVT due to birth control use.  - Ordered Doppler ultrasound to evaluate for DVT.

## 2024-07-20 LAB — CMP14+EGFR
ALT: 10 IU/L (ref 0–32)
AST: 12 IU/L (ref 0–40)
Albumin: 4.1 g/dL (ref 3.9–4.9)
Alkaline Phosphatase: 66 IU/L (ref 41–116)
BUN/Creatinine Ratio: 20 (ref 9–23)
BUN: 13 mg/dL (ref 6–20)
Bilirubin Total: 0.3 mg/dL (ref 0.0–1.2)
CO2: 22 mmol/L (ref 20–29)
Calcium: 9.4 mg/dL (ref 8.7–10.2)
Chloride: 102 mmol/L (ref 96–106)
Creatinine, Ser: 0.66 mg/dL (ref 0.57–1.00)
Globulin, Total: 3 g/dL (ref 1.5–4.5)
Glucose: 75 mg/dL (ref 70–99)
Potassium: 4.5 mmol/L (ref 3.5–5.2)
Sodium: 135 mmol/L (ref 134–144)
Total Protein: 7.1 g/dL (ref 6.0–8.5)
eGFR: 120 mL/min/1.73 (ref >59)

## 2024-07-20 LAB — LIPID PANEL
Chol/HDL Ratio: 3.6 ratio (ref 0.0–4.4)
Cholesterol, Total: 167 mg/dL (ref 100–199)
HDL: 47 mg/dL (ref >39)
LDL Chol Calc (NIH): 106 mg/dL — ABNORMAL HIGH (ref 0–99)
Triglycerides: 71 mg/dL (ref 0–149)
VLDL Cholesterol Cal: 14 mg/dL (ref 5–40)

## 2024-07-20 LAB — HEMOGLOBIN A1C
Est. average glucose Bld gHb Est-mCnc: 105 mg/dL
Hgb A1c MFr Bld: 5.3 % (ref 4.8–5.6)

## 2024-07-20 LAB — CBC
Hematocrit: 37.9 % (ref 34.0–46.6)
Hemoglobin: 12.2 g/dL (ref 11.1–15.9)
MCH: 25.2 pg — ABNORMAL LOW (ref 26.6–33.0)
MCHC: 32.2 g/dL (ref 31.5–35.7)
MCV: 78 fL — ABNORMAL LOW (ref 79–97)
Platelets: 240 x10E3/uL (ref 150–450)
RBC: 4.84 x10E6/uL (ref 3.77–5.28)
RDW: 13.7 % (ref 11.7–15.4)
WBC: 9.1 x10E3/uL (ref 3.4–10.8)

## 2024-07-20 LAB — VITAMIN D 25 HYDROXY (VIT D DEFICIENCY, FRACTURES): Vit D, 25-Hydroxy: 66.7 ng/mL (ref 30.0–100.0)

## 2024-07-25 ENCOUNTER — Ambulatory Visit (HOSPITAL_COMMUNITY)
Admission: RE | Admit: 2024-07-25 | Discharge: 2024-07-25 | Disposition: A | Discharge status: Home / Self Care | Payer: PRIVATE HEALTH INSURANCE | Source: Ambulatory Visit | Attending: Family Medicine | Admitting: Family Medicine

## 2024-07-25 DIAGNOSIS — R2231 Localized swelling, mass and lump, right upper limb: Secondary | ICD-10-CM | POA: Insufficient documentation

## 2024-07-27 ENCOUNTER — Ambulatory Visit: Payer: Self-pay | Admitting: Family Medicine

## 2024-08-31 ENCOUNTER — Other Ambulatory Visit: Payer: Self-pay

## 2024-08-31 DIAGNOSIS — E1165 Type 2 diabetes mellitus with hyperglycemia: Secondary | ICD-10-CM

## 2024-09-04 LAB — OPHTHALMOLOGY REPORT-SCANNED

## 2024-09-23 ENCOUNTER — Other Ambulatory Visit: Payer: Self-pay | Admitting: Family Medicine

## 2024-09-24 ENCOUNTER — Other Ambulatory Visit: Payer: Self-pay

## 2024-09-24 ENCOUNTER — Emergency Department (HOSPITAL_COMMUNITY)

## 2024-09-24 ENCOUNTER — Emergency Department (HOSPITAL_COMMUNITY)
Admission: EM | Admit: 2024-09-24 | Discharge: 2024-09-24 | Discharge status: Against Medical Advice | Attending: Emergency Medicine | Admitting: Emergency Medicine

## 2024-09-24 ENCOUNTER — Emergency Department (HOSPITAL_COMMUNITY)
Admission: EM | Admit: 2024-09-24 | Discharge: 2024-09-24 | Disposition: A | Discharge status: Home / Self Care | Attending: Emergency Medicine | Admitting: Emergency Medicine

## 2024-09-24 ENCOUNTER — Encounter (HOSPITAL_COMMUNITY): Payer: Self-pay

## 2024-09-24 DIAGNOSIS — Z5321 Procedure and treatment not carried out due to patient leaving prior to being seen by health care provider: Secondary | ICD-10-CM | POA: Insufficient documentation

## 2024-09-24 DIAGNOSIS — M79642 Pain in left hand: Secondary | ICD-10-CM | POA: Diagnosis not present

## 2024-09-24 DIAGNOSIS — E119 Type 2 diabetes mellitus without complications: Secondary | ICD-10-CM | POA: Insufficient documentation

## 2024-09-24 DIAGNOSIS — S39012A Strain of muscle, fascia and tendon of lower back, initial encounter: Secondary | ICD-10-CM | POA: Insufficient documentation

## 2024-09-24 DIAGNOSIS — M25571 Pain in right ankle and joints of right foot: Secondary | ICD-10-CM | POA: Diagnosis present

## 2024-09-24 DIAGNOSIS — Z7984 Long term (current) use of oral hypoglycemic drugs: Secondary | ICD-10-CM | POA: Insufficient documentation

## 2024-09-24 DIAGNOSIS — Y92410 Unspecified street and highway as the place of occurrence of the external cause: Secondary | ICD-10-CM | POA: Insufficient documentation

## 2024-09-24 DIAGNOSIS — Z794 Long term (current) use of insulin: Secondary | ICD-10-CM | POA: Insufficient documentation

## 2024-09-24 DIAGNOSIS — S3992XA Unspecified injury of lower back, initial encounter: Secondary | ICD-10-CM | POA: Diagnosis present

## 2024-09-24 DIAGNOSIS — Z91040 Latex allergy status: Secondary | ICD-10-CM | POA: Insufficient documentation

## 2024-09-24 MED ORDER — OXYCODONE-ACETAMINOPHEN 5-325 MG PO TABS
1.0000 | ORAL_TABLET | Freq: Once | ORAL | Status: AC
  Administered 2024-09-24: 1 via ORAL
  Filled 2024-09-24: qty 1

## 2024-09-24 MED ORDER — METHOCARBAMOL 500 MG PO TABS: 500.0000 mg | ORAL_TABLET | Freq: Two times a day (BID) | ORAL | 0 refills | Status: DC

## 2024-09-24 MED ORDER — MELOXICAM 7.5 MG PO TABS: 7.5000 mg | ORAL_TABLET | Freq: Every day | ORAL | 0 refills | Status: AC

## 2024-09-24 MED ORDER — ONDANSETRON 4 MG PO TBDP
4.0000 mg | ORAL_TABLET | Freq: Once | ORAL | Status: AC
  Administered 2024-09-24: 4 mg via ORAL
  Filled 2024-09-24: qty 1
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
## 2024-09-24 NOTE — ED Notes (Signed)
 Pt able to demonstrate proper use of crutches. SABRA.The patient is A&OX4. Pt verbalized understanding of d/c instructions and follow up care and wheeled out of the ED via wheelchair.

## 2024-09-24 NOTE — ED Notes (Signed)
 Pt on the phone complaining that nobody is helping her, pt asked if she needed help to the bathroom, no response.
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
## 2024-09-24 NOTE — Discharge Instructions (Signed)
 Thank you for letting us  evaluate you today.  Your ankle x-ray did not show any fracture or dislocation.  Does not rule out ligamentous injury or sprain.  I have given you a cam boot and crutches to reduce weightbearing on injury.  You may follow-up with orthopedics if you do not improve in the next 5-7 days.  I also recommend ice, elevation to reduce pain, swelling.  I did send Aloxi cam to your pharmacy for anti-inflammatory and pain.  Do not take naproxen, ibuprofen , Aleve, Advil , aspirin  with this as they are in the same family.  I also sent Robaxin  to your pharmacy which is for muscle strain and a muscle relaxer.  This may make you drowsy.  Do not drive or operate heavy machinery this do not drink alcohol  with this.  People typically take this at night due to drowsy properties  Return to Emergency Department for experience worsening symptoms otherwise you can follow-up with orthopedics
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
## 2024-09-24 NOTE — ED Notes (Signed)
 Patient was given crackers and juice per patient request.

## 2024-09-28 ENCOUNTER — Ambulatory Visit (HOSPITAL_COMMUNITY)
Admission: RE | Admit: 2024-09-28 | Discharge: 2024-09-28 | Disposition: A | Discharge status: Home / Self Care | Attending: Internal Medicine | Admitting: Internal Medicine

## 2024-09-28 ENCOUNTER — Encounter (HOSPITAL_COMMUNITY): Payer: Self-pay

## 2024-09-28 VITALS — BP 104/71 | HR 82 | Temp 98.0°F | Resp 16

## 2024-09-28 DIAGNOSIS — R52 Pain, unspecified: Secondary | ICD-10-CM | POA: Diagnosis not present

## 2024-09-28 MED ORDER — BACLOFEN 10 MG PO TABS: 10.0000 mg | ORAL_TABLET | Freq: Three times a day (TID) | ORAL | 0 refills | Status: AC
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
## 2024-11-16 ENCOUNTER — Ambulatory Visit: Payer: PRIVATE HEALTH INSURANCE | Admitting: Family Medicine

## 2025-07-25 ENCOUNTER — Encounter: Payer: Self-pay | Admitting: Family Medicine
